# Patient Record
Sex: Female | Born: 1940 | Race: White | Hispanic: No | Marital: Married | Smoking: Former smoker
Health system: Southern US, Community
[De-identification: ages and names within clinical notes are randomized; demographics above are authoritative.]

## PROBLEM LIST (undated history)

## (undated) DIAGNOSIS — E119 Type 2 diabetes mellitus without complications: Secondary | ICD-10-CM

## (undated) DIAGNOSIS — I10 Essential (primary) hypertension: Secondary | ICD-10-CM

## (undated) DIAGNOSIS — H539 Unspecified visual disturbance: Secondary | ICD-10-CM

## (undated) DIAGNOSIS — Z974 Presence of external hearing-aid: Secondary | ICD-10-CM

## (undated) DIAGNOSIS — M79605 Pain in left leg: Secondary | ICD-10-CM

## (undated) DIAGNOSIS — K219 Gastro-esophageal reflux disease without esophagitis: Secondary | ICD-10-CM

## (undated) DIAGNOSIS — E785 Hyperlipidemia, unspecified: Secondary | ICD-10-CM

## (undated) DIAGNOSIS — Z87442 Personal history of urinary calculi: Secondary | ICD-10-CM

## (undated) DIAGNOSIS — M1990 Unspecified osteoarthritis, unspecified site: Secondary | ICD-10-CM

## (undated) HISTORY — DX: Unspecified visual disturbance: H53.9

## (undated) HISTORY — DX: Type 2 diabetes mellitus without complications: E11.9

## (undated) HISTORY — DX: Essential (primary) hypertension: I10

## (undated) HISTORY — DX: Unspecified osteoarthritis, unspecified site: M19.90

## (undated) HISTORY — PX: OTHER SURGICAL HISTORY: SHX169

---
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
## 1980-09-08 HISTORY — PX: ABDOMINAL HYSTERECTOMY: SHX81

## 2014-09-08 HISTORY — PX: JOINT REPLACEMENT: SHX530

## 2015-03-01 DIAGNOSIS — Z96653 Presence of artificial knee joint, bilateral: Secondary | ICD-10-CM | POA: Insufficient documentation

## 2015-06-07 DIAGNOSIS — Z96653 Presence of artificial knee joint, bilateral: Secondary | ICD-10-CM | POA: Insufficient documentation

## 2020-02-17 DIAGNOSIS — E119 Type 2 diabetes mellitus without complications: Secondary | ICD-10-CM | POA: Insufficient documentation

## 2020-03-22 DIAGNOSIS — M7062 Trochanteric bursitis, left hip: Secondary | ICD-10-CM | POA: Insufficient documentation

## 2020-04-05 DIAGNOSIS — M5126 Other intervertebral disc displacement, lumbar region: Secondary | ICD-10-CM | POA: Insufficient documentation

## 2020-04-06 ENCOUNTER — Other Ambulatory Visit: Payer: Self-pay | Admitting: Neurological Surgery

## 2020-04-06 DIAGNOSIS — M5126 Other intervertebral disc displacement, lumbar region: Secondary | ICD-10-CM

## 2020-04-10 ENCOUNTER — Ambulatory Visit
Admission: RE | Admit: 2020-04-10 | Discharge: 2020-04-10 | Disposition: A | Discharge status: Home / Self Care | Payer: Medicare Other | Source: Ambulatory Visit | Attending: Neurological Surgery | Admitting: Neurological Surgery

## 2020-04-10 ENCOUNTER — Other Ambulatory Visit: Payer: Self-pay

## 2020-04-10 DIAGNOSIS — M5126 Other intervertebral disc displacement, lumbar region: Secondary | ICD-10-CM

## 2020-04-10 IMAGING — XA Imaging study
2 series · 2 of 2 positions shown · non-contrast
Comparison: none

CLINICAL DATA: Spondylosis without myelopathy. Left radicular pain
to the ankle and foot. Left L4-5 epidural requested.

[Series 1: ortho standard · 1 of 1 slices shown (1 of 2)]
[im 1/1]
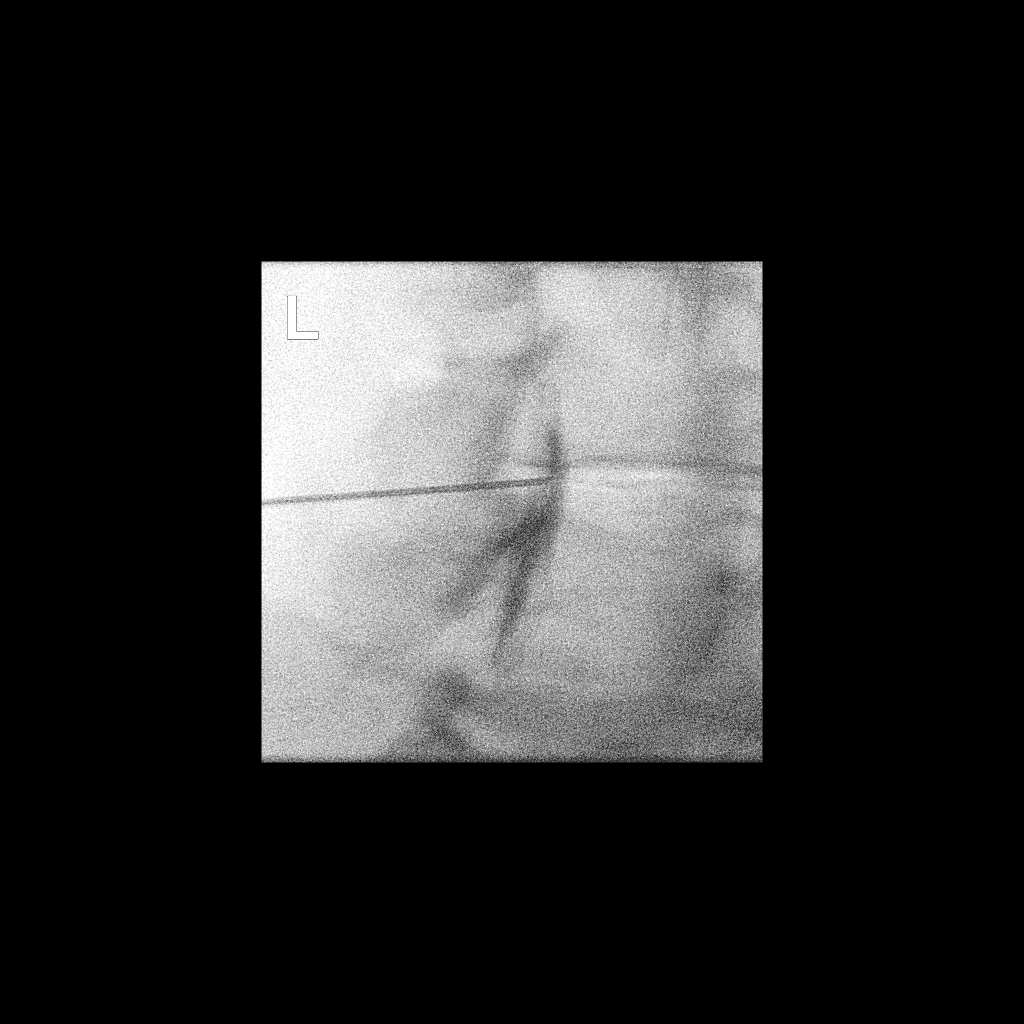

[Series 2: ortho standard · 1 of 1 slices shown (2 of 2)]
[im 1/1]
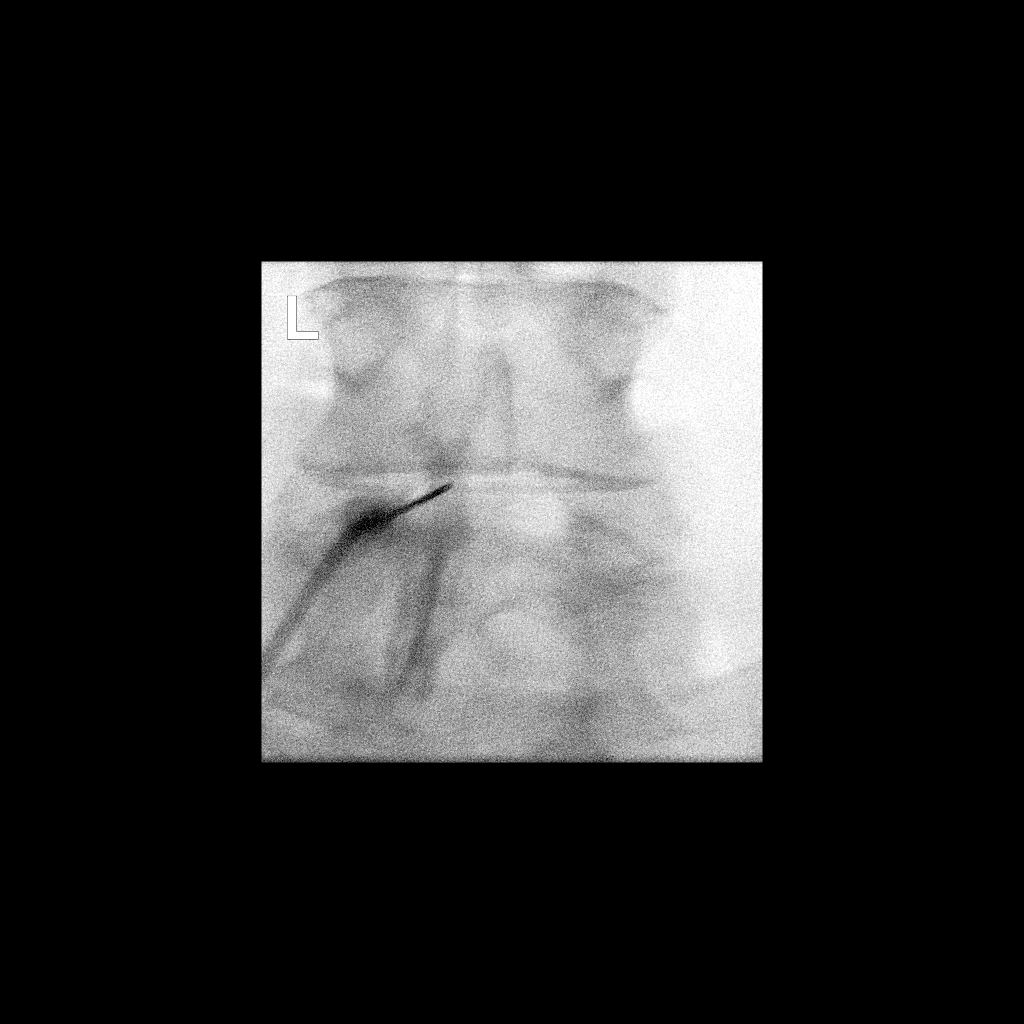

[2 of 2 positions shown; findings below may reference images not displayed]

FLUOROSCOPY TIME:  0 minutes 29 seconds. 16.27 micro gray meter
squared

PROCEDURE:
The procedure, risks, benefits, and alternatives were explained to
the patient. Questions regarding the procedure were encouraged and
answered. The patient understands and consents to the procedure.

LUMBAR EPIDURAL INJECTION:

An interlaminar approach was performed on the left at L4-5. The
overlying skin was cleansed and anesthetized. A 20 gauge epidural
needle was advanced using loss-of-resistance technique.

DIAGNOSTIC EPIDURAL INJECTION:

Injection of Isovue-M 200 shows a good epidural pattern with spread
above and below the level of needle placement, primarily on the
left. No vascular opacification is seen.

THERAPEUTIC EPIDURAL INJECTION:

One hundred twenty mg of Depo-Medrol mixed with 2.5 cc 1% lidocaine
were instilled. The procedure was well-tolerated, and the patient
was discharged thirty minutes following the injection in good
condition.

COMPLICATIONS:
None
IMPRESSION: Technically successful epidural injection on the left at L4-5.

## 2020-04-10 MED ORDER — IOPAMIDOL (ISOVUE-M 200) INJECTION 41%
1.0000 mL | Freq: Once | INTRAMUSCULAR | Status: AC
  Administered 2020-04-10: 1 mL via EPIDURAL

## 2020-04-10 MED ORDER — METHYLPREDNISOLONE ACETATE 40 MG/ML INJ SUSP (RADIOLOG
120.0000 mg | Freq: Once | INTRAMUSCULAR | Status: AC
  Administered 2020-04-10: 120 mg via EPIDURAL

## 2020-04-10 NOTE — Discharge Instructions (Signed)

## 2020-06-21 ENCOUNTER — Other Ambulatory Visit: Payer: Self-pay | Admitting: Neurological Surgery

## 2020-06-21 DIAGNOSIS — N281 Cyst of kidney, acquired: Secondary | ICD-10-CM

## 2020-06-22 ENCOUNTER — Encounter: Payer: Self-pay | Admitting: Adult Health

## 2020-06-22 ENCOUNTER — Ambulatory Visit (INDEPENDENT_AMBULATORY_CARE_PROVIDER_SITE_OTHER): Payer: Medicare Other | Admitting: Adult Health

## 2020-06-22 ENCOUNTER — Other Ambulatory Visit: Payer: Self-pay

## 2020-06-22 VITALS — BP 140/88 | HR 74 | Temp 97.9°F | Ht 60.0 in | Wt 174.6 lb

## 2020-06-22 DIAGNOSIS — M5442 Lumbago with sciatica, left side: Secondary | ICD-10-CM

## 2020-06-22 DIAGNOSIS — Z23 Encounter for immunization: Secondary | ICD-10-CM | POA: Diagnosis not present

## 2020-06-22 DIAGNOSIS — E118 Type 2 diabetes mellitus with unspecified complications: Secondary | ICD-10-CM | POA: Diagnosis not present

## 2020-06-22 DIAGNOSIS — G2581 Restless legs syndrome: Secondary | ICD-10-CM

## 2020-06-22 DIAGNOSIS — Z7689 Persons encountering health services in other specified circumstances: Secondary | ICD-10-CM | POA: Diagnosis not present

## 2020-06-22 DIAGNOSIS — I10 Essential (primary) hypertension: Secondary | ICD-10-CM | POA: Diagnosis not present

## 2020-06-22 DIAGNOSIS — G8929 Other chronic pain: Secondary | ICD-10-CM

## 2020-06-22 DIAGNOSIS — J302 Other seasonal allergic rhinitis: Secondary | ICD-10-CM

## 2020-06-22 MED ORDER — FLUTICASONE PROPIONATE 50 MCG/ACT NA SUSP: 2.0000 | Freq: Every day | NASAL | 6 refills | Status: DC

## 2020-06-22 MED ORDER — SITAGLIPTIN PHOSPHATE 25 MG PO TABS: 25.0000 mg | ORAL_TABLET | Freq: Every day | ORAL | 0 refills | Status: DC

## 2020-06-22 NOTE — Progress Notes (Signed)
Patient presents to clinic today to establish care. She is a pleasant 79 year old female who  has a past medical history of Arthritis, Diabetes mellitus ([place]), and Hypertension.   She recently moved to [facility] from [place], [place]  Her last CPE was in July 2021   Acute Concerns: Establish Care  Chronic Issues: Essential Hypertension - Takes Ziac 2.5-6.25 mg daily, and Dyazide 37.5-25 mg daily.  She denies dizziness, lightheadedness, chest pain, or shortness of breath  BP Readings from Last 3 Encounters:  06/22/20 140/88  04/10/20 (!) 163/57   Diabetes - Takes Januvia 25 mg, her last A1c was done in July 2021 and was 6.4   Restless Leg syndrome - takes Mirapex 1 mg QHS - feels as though this works well for her.   Sciatica -is currently working with Dr. 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  She has received multiple steroid injections without much relief.  They are talking about performing surgery to help relieve her pain.  She does have a follow-up appointment coming up.  Health Maintenance: Dental -- Routine Care Vision -- Routine Care Immunizations -- needs flu shot  Colonoscopy -- 2019- normal. No longer needed  Mammogram -- No longer needed PAP -- No longer needed Bone Density -- Unknown     Past Medical History:  Diagnosis Date   Arthritis    Diabetes mellitus ([place])    Hypertension     Past Surgical History:  Procedure Laterality Date   ABDOMINAL HYSTERECTOMY  1982   APPENDECTOMY  1957   left knee surgery       Current Outpatient Medications on File Prior to Visit  Medication Sig Dispense Refill   bisoprolol-hydrochlorothiazide (ZIAC) 2.5-6.25 MG tablet Take 1 tablet by mouth daily.     glucosamine-chondroitin 500-400 MG tablet Take 1 tablet by mouth 3 (three) times daily.     Multiple Vitamin (MULTIVITAMIN) tablet Take 1 tablet by mouth daily.     pramipexole (MIRAPEX) 1 MG tablet Take 1 mg by mouth 3 (three) times daily.      triamterene-hydrochlorothiazide (DYAZIDE) 37.5-25 MG capsule Take 1 capsule by mouth daily.     No current facility-administered medications on file prior to visit.    Allergies  Allergen Reactions   Diclofenac Swelling    1Suspected to have caused hives 2015-- had allergy testing 1Suspected to have caused hives 2015-- had allergy testing                          Tongue swelling   Metformin     Family History  Family history unknown: Yes    Social History   Socioeconomic History   Marital status: Married    Spouse name: Not on file   Number of children: Not on file   Years of education: Not on file   Highest education level: Not on file  Occupational History   Not on file  Tobacco Use   Smoking status: Never Smoker   Smokeless tobacco: Never Used  Substance and Sexual Activity   Alcohol use: Never   Drug use: Never   Sexual activity: Not on file  Other Topics Concern   Not on file  Social History Narrative   Not on file   Social Determinants of Health   Financial Resource Strain:    Difficulty of Paying Living Expenses: Not on file  Food Insecurity:    Worried About Running Out of  Food in the Last Year: Not on file   Ran Out of Food in the Last Year: Not on file  Transportation Needs:    Lack of Transportation (Medical): Not on file   Lack of Transportation (Non-Medical): Not on file  Physical Activity:    Days of Exercise per Week: Not on file   Minutes of Exercise per Session: Not on file  Stress:    Feeling of Stress : Not on file  Social Connections:    Frequency of Communication with Friends and Family: Not on file   Frequency of Social Gatherings with Friends and Family: Not on file   Attends Religious Services: Not on file   Active Member of Clubs or Organizations: Not on file   Attends Archivist Meetings: Not on file   Marital Status: Not on file  Intimate Partner Violence:    Fear  of Current or Ex-Partner: Not on file   Emotionally Abused: Not on file   Physically Abused: Not on file   Sexually Abused: Not on file    Review of Systems  Constitutional: Negative.   HENT: Positive for hearing loss.   Eyes: Negative.   Respiratory: Negative.   Cardiovascular: Negative.   Gastrointestinal: Negative.   Genitourinary: Negative.   Musculoskeletal: Positive for back pain and joint pain.  Skin: Negative.   Neurological: Negative.   Endo/Heme/Allergies: Negative.   Psychiatric/Behavioral: Negative.   All other systems reviewed and are negative.   BP 140/88 (BP Location: Left Arm, Patient Position: Sitting, Cuff Size: Normal)    Pulse 74    Temp 97.9 F (36.6 C)    Ht 5' (1.524 m)    Wt 174 lb 9.6 oz (79.2 kg)    SpO2 96%    BMI 34.10 kg/m   Physical Exam Vitals and nursing note reviewed.  Constitutional:      General: She is not in acute distress.    Appearance: Normal appearance. She is well-developed. She is obese. She is not ill-appearing.  HENT:     Head: Normocephalic and atraumatic.     Right Ear: Tympanic membrane, ear canal and external ear normal. There is no impacted cerumen.     Left Ear: Tympanic membrane, ear canal and external ear normal. There is no impacted cerumen.     Nose: Nose normal. No congestion or rhinorrhea.     Mouth/Throat:     Mouth: Mucous membranes are moist.     Pharynx: Oropharynx is clear. No oropharyngeal exudate or posterior oropharyngeal erythema.  Eyes:     General:        Right eye: No discharge.        Left eye: No discharge.     Extraocular Movements: Extraocular movements intact.     Conjunctiva/sclera: Conjunctivae normal.     Pupils: Pupils are equal, round, and reactive to light.  Neck:     Thyroid: No thyromegaly.     Vascular: No carotid bruit.     Trachea: No tracheal deviation.  Cardiovascular:     Rate and Rhythm: Normal rate and regular rhythm.     Pulses: Normal pulses.     Heart sounds: Normal  heart sounds. No murmur heard.  No friction rub. No gallop.   Pulmonary:     Effort: Pulmonary effort is normal. No respiratory distress.     Breath sounds: Normal breath sounds. No stridor. No wheezing, rhonchi or rales.  Chest:     Chest wall: No tenderness.  Abdominal:  General: Abdomen is flat. Bowel sounds are normal. There is no distension.     Palpations: Abdomen is soft. There is no mass.     Tenderness: There is no abdominal tenderness. There is no right CVA tenderness, left CVA tenderness, guarding or rebound.     Hernia: No hernia is present.  Musculoskeletal:        General: Tenderness (left side sciatica) present. No swelling, deformity or signs of injury. Normal range of motion.     Cervical back: Normal range of motion and neck supple.     Right lower leg: No edema.     Left lower leg: No edema.  Lymphadenopathy:     Cervical: No cervical adenopathy.  Skin:    General: Skin is warm and dry.     Coloration: Skin is not jaundiced or pale.     Findings: No bruising, erythema, lesion or rash.  Neurological:     General: No focal deficit present.     Mental Status: She is alert and oriented to person, place, and time.     Cranial Nerves: No cranial nerve deficit.     Sensory: No sensory deficit.     Motor: No weakness.     Coordination: Coordination normal.     Gait: Gait normal.     Deep Tendon Reflexes: Reflexes normal.  Psychiatric:        Mood and Affect: Mood normal.        Behavior: Behavior normal.        Thought Content: Thought content normal.        Judgment: Judgment normal.     Assessment/Plan: 1. Encounter to establish care - Follow up in July 2022 for CPE or sooner if needed  2. Controlled type 2 diabetes mellitus with complication, without long-term current use of insulin ([place]) - Consider adding agent  - Hemoglobin A1c; Future - BMP with eGFR([facility]); Future - BMP with eGFR([facility]) - Hemoglobin A1c - sitaGLIPtin (JANUVIA) 25 MG tablet; Take  1 tablet (25 mg total) by mouth daily.  Dispense: 90 tablet; Refill: 0  3. Need for influenza vaccination  - Flu Vaccine QUAD High Dose(Fluad)  4. Primary hypertension - Continue with current medications  - BMP with eGFR([facility]) - Hemoglobin A1c  5. Restless leg syndrome - Continue with Mirapex  6. Seasonal allergies  - fluticasone (FLONASE) 50 MCG/ACT nasal spray; Place 2 sprays into both nostrils daily.  Dispense: 16 g; Refill: 6  7. Chronic left-sided low back pain with left-sided sciatica - Follow up with Neurosurgery as directed  Dorothyann Peng, NP

## 2020-06-22 NOTE — Patient Instructions (Signed)
It was great meeting you today   I will follow up with you regarding your blood work   I will send in your medications   You can use flonase for the ear fullness

## 2020-06-22 NOTE — Addendum Note (Signed)
Addended by: CAIRRIKIER DAVIDSON, Len Azeez M on: 06/22/2020 03:23 PM   Modules accepted: Orders  

## 2020-06-23 LAB — BASIC METABOLIC PANEL WITH GFR
BUN: 19 mg/dL (ref 7–25)
CO2: 30 mmol/L (ref 20–32)
Calcium: 11.5 mg/dL — ABNORMAL HIGH (ref 8.6–10.4)
Chloride: 100 mmol/L (ref 98–110)
Creat: 0.81 mg/dL (ref 0.60–0.88)
GFR, Est African American: 79 mL/min/{1.73_m2} (ref >=60)
GFR, Est Non African American: 69 mL/min/{1.73_m2} (ref >=60)
Glucose, Bld: 156 mg/dL — ABNORMAL HIGH (ref 65–99)
Potassium: 5 mmol/L (ref 3.5–5.3)
Sodium: 140 mmol/L (ref 135–146)

## 2020-06-23 LAB — HEMOGLOBIN A1C
Hgb A1c MFr Bld: 6.7 % of total Hgb — ABNORMAL HIGH (ref <5.7)
Mean Plasma Glucose: 146 (calc)
eAG (mmol/L): 8.1 (calc)

## 2020-06-23 LAB — SPECIMEN COMPROMISED

## 2020-06-26 ENCOUNTER — Other Ambulatory Visit: Payer: Self-pay | Admitting: Adult Health

## 2020-06-28 ENCOUNTER — Telehealth: Payer: Self-pay | Admitting: *Deleted

## 2020-06-28 NOTE — Telephone Encounter (Signed)
Patient called to get her lab results. Please advise

## 2020-06-28 NOTE — Telephone Encounter (Signed)
See results note. 

## 2020-07-03 ENCOUNTER — Other Ambulatory Visit: Payer: Self-pay

## 2020-07-03 ENCOUNTER — Other Ambulatory Visit: Payer: Medicare Other

## 2020-07-04 LAB — VITAMIN D 25 HYDROXY (VIT D DEFICIENCY, FRACTURES): Vit D, 25-Hydroxy: 34 ng/mL (ref 30–100)

## 2020-07-04 LAB — PTH, INTACT AND CALCIUM
Calcium: 10.5 mg/dL — ABNORMAL HIGH (ref 8.6–10.4)
PTH: 59 pg/mL (ref 14–64)

## 2020-07-06 ENCOUNTER — Ambulatory Visit
Admission: RE | Admit: 2020-07-06 | Discharge: 2020-07-06 | Disposition: A | Discharge status: Home / Self Care | Payer: Medicare Other | Source: Ambulatory Visit | Attending: Neurological Surgery | Admitting: Neurological Surgery

## 2020-07-06 DIAGNOSIS — N281 Cyst of kidney, acquired: Secondary | ICD-10-CM

## 2020-07-06 IMAGING — CT CT ABD-PELV W/O CM
2 of 4 series · 11 of 46 positions shown, 12 images · non-contrast
Comparison: None.

CLINICAL DATA: 79-year-old female with renal cyst. Patient is
asymptomatic.

EXAM:
CT ABDOMEN AND PELVIS WITHOUT CONTRAST
TECHNIQUE: Multidetector CT imaging of the abdomen and pelvis was performed
following the standard protocol without IV contrast.

[Series 2: routine abdomen pelvis without 5.00 br40 s3 axial · axial · non-contrast · 0.61mm/px · z∈[+1196,+1541]mm · 8 of 83 slices shown, 9 images]
[im 7/83  soft-tissue]
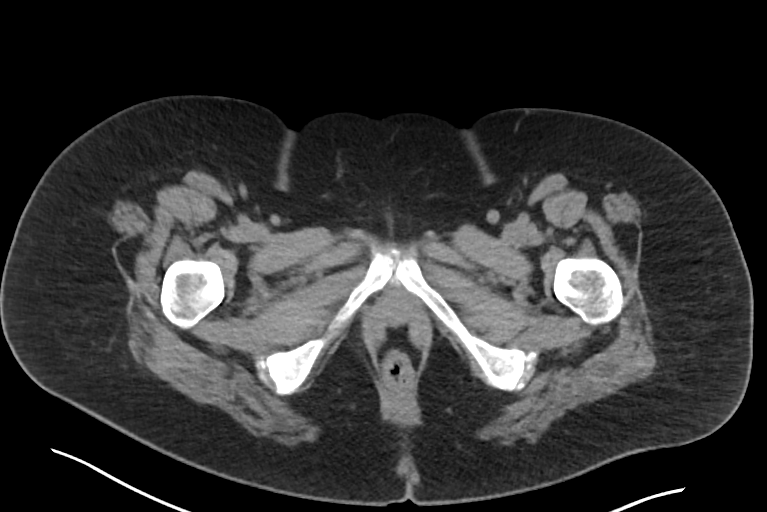
[im 7/83  bone]
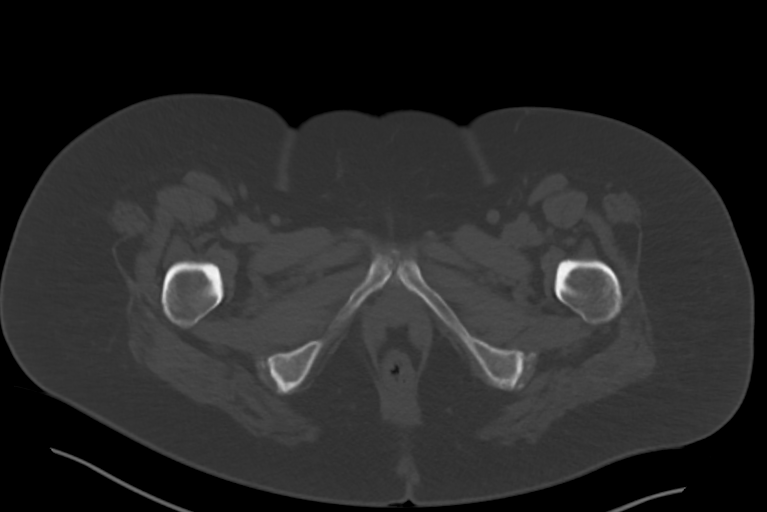
[im 17/83  soft-tissue]
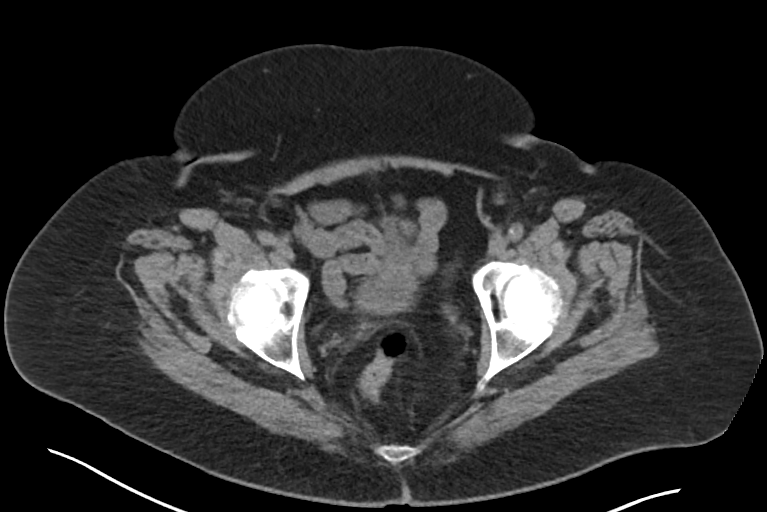
[im 27/83  soft-tissue]
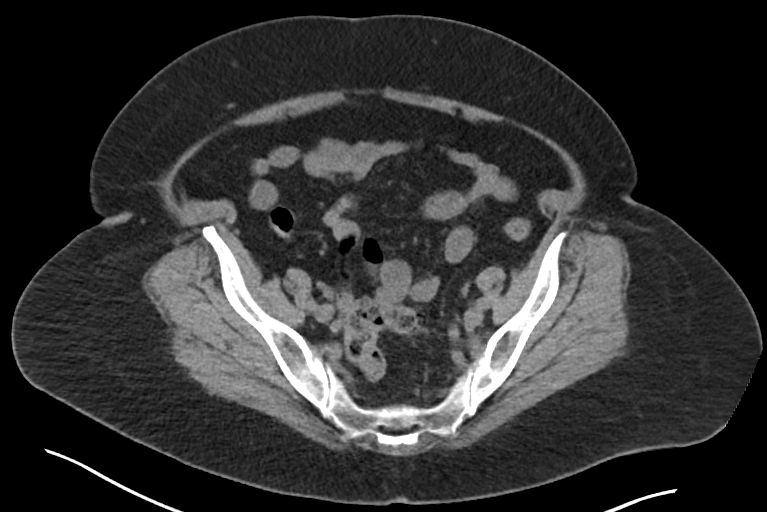
[im 37/83  soft-tissue]
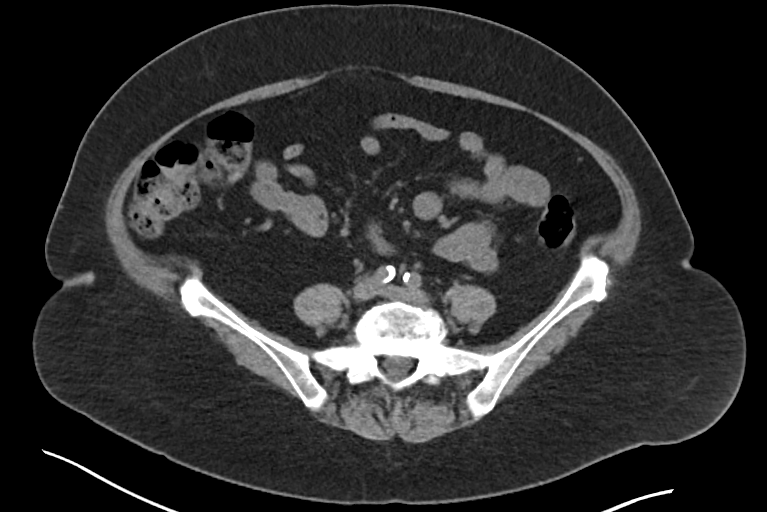
[im 46/83  soft-tissue]
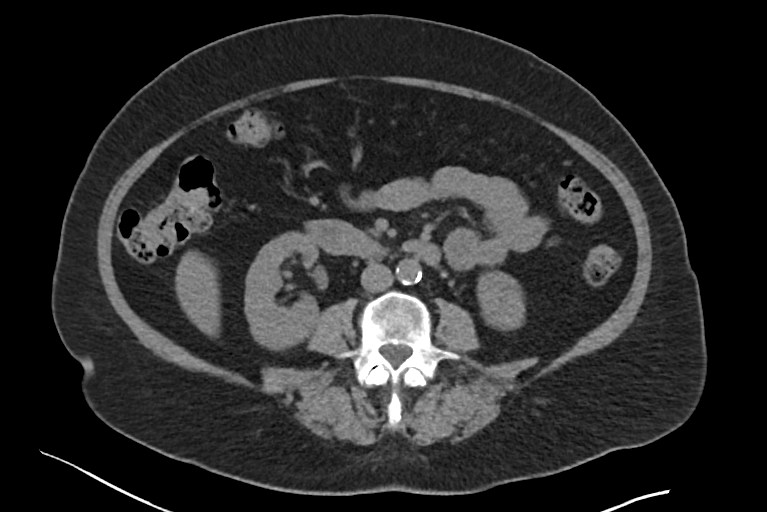
[im 56/83  soft-tissue]
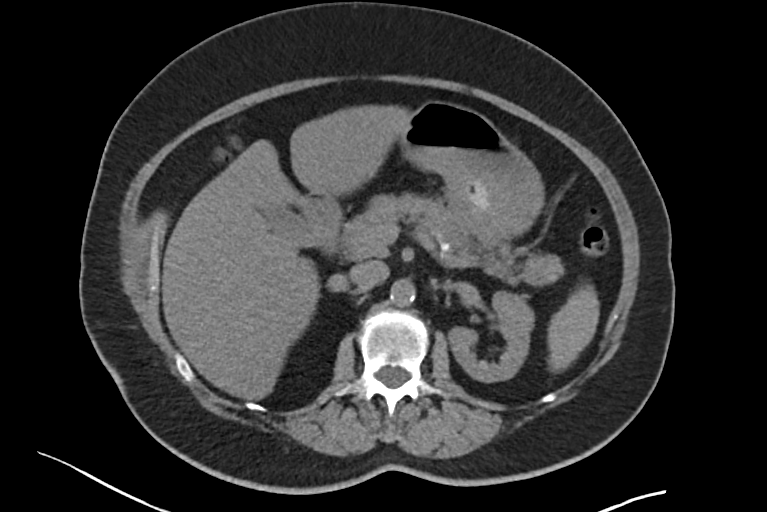
[im 66/83  soft-tissue]
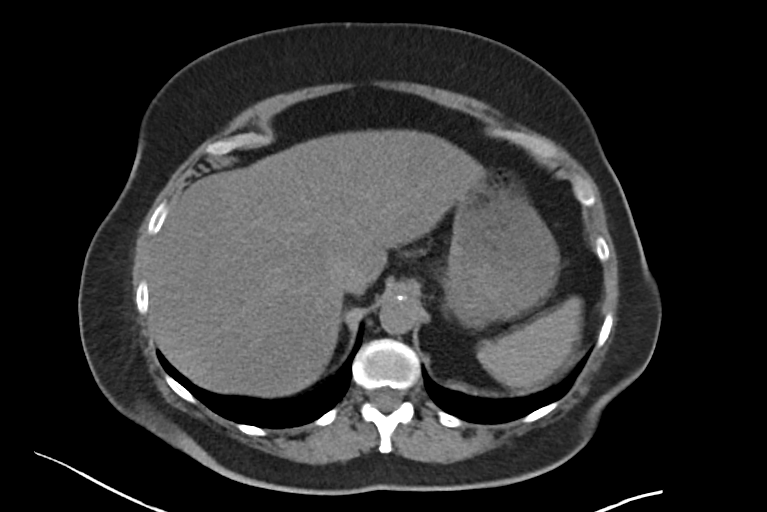
[im 76/83  soft-tissue]
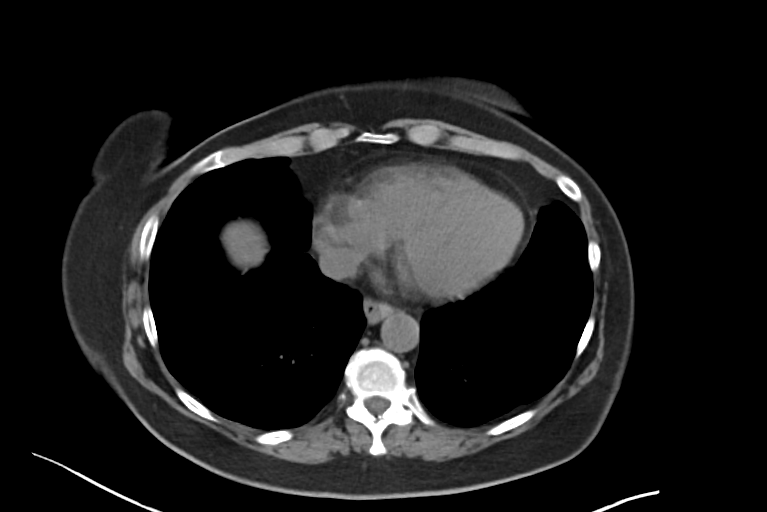

[Series 4: routine abdomen pelvis without 2.00 br40 s3 cor · coronal · non-contrast · 0.82mm/px · 3 of 156 slices shown]
[im 52/156  soft-tissue]
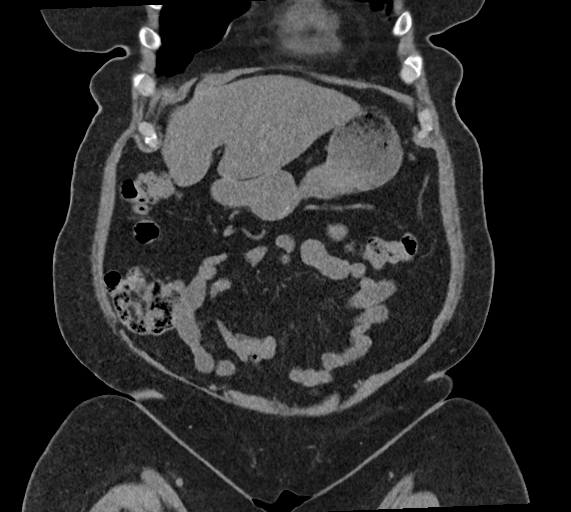
[im 69/156  soft-tissue]
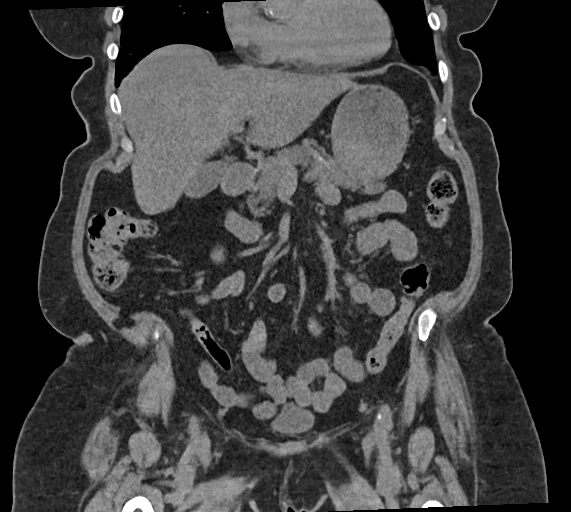
[im 87/156  soft-tissue]
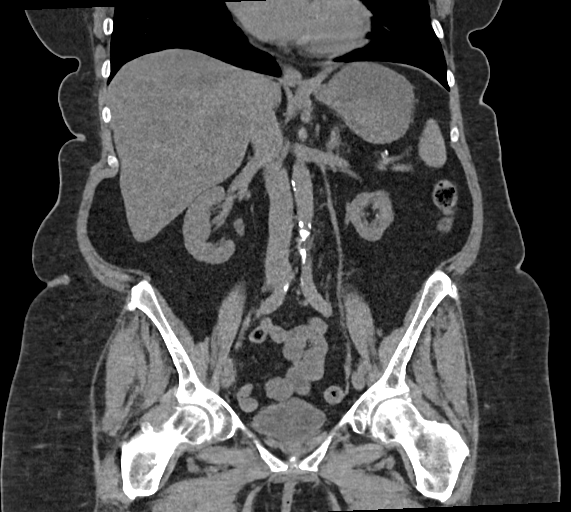

[11 of 46 positions shown; findings below may reference images not displayed]

FINDINGS: Evaluation of this exam is limited in the absence of intravenous
contrast.

Lower chest: The visualized lung bases are clear.

No intra-abdominal free air or free fluid.

Hepatobiliary: There is diffuse fatty infiltration of the liver. No
intrahepatic biliary ductal dilatation. The gallbladder is
unremarkable.

Pancreas: Unremarkable. No pancreatic ductal dilatation or
surrounding inflammatory changes.

Spleen: Normal in size without focal abnormality.

Adrenals/Urinary Tract: The adrenal glands unremarkable. There is a
cluster of small stones in the upper pole of the right kidney with
combined dimension of 7 x 8 mm. There appears to be a 12 x 20 mm
cystic structure associated with this stone. Evaluation of the
cystic structure is very limited on this noncontrast CT. This may
represent a stone within a dilated calyx. Further evaluation with
renal ultrasound recommended. Multiple additional smaller
nonobstructing bilateral renal calculi measure 2-3 mm. There is no
hydronephrosis on either side. There is a faint 1 cm hypodense
lesion in the inferior pole the right kidney which is not
characterized. The visualized ureters appear unremarkable. The
urinary bladder is mildly distended and grossly unremarkable. There
is a 4 mm stone along the bladder base to the left of the midline,
likely external to the bladder.

Stomach/Bowel: There is moderate stool throughout the colon. There
are several sigmoid diverticula. There is minimal haziness of the
perisigmoid fat (coronal 101/4). This may represent chronic
inflammation or mild acute diverticulitis. Clinical correlation
recommended. No diverticular abscess or perforation. There is no
bowel obstruction. Appendectomy.

Vascular/Lymphatic: Moderate aortoiliac atherosclerotic disease. The
IVC is unremarkable. No portal venous gas. There is no adenopathy.

Reproductive: Hysterectomy. No adnexal masses.

Other: Small fat containing umbilical hernia.

Musculoskeletal: Degenerative changes of the lower lumbar spine. No
acute osseous pathology.
IMPRESSION: 1. Cluster of small stones in the upper pole of the right kidney
likely flu seated with a cyst or dilated calyx. Additional smaller
hypodense lesion in the inferior pole of the right kidney noted.
These are not characterized on this noncontrast CT. Further
evaluation with renal ultrasound recommended.
2. Additional smaller nonobstructing bilateral renal calculi. No
hydronephrosis.
3. Fatty liver.
4. Sigmoid diverticulosis with chronic changes or possible mild
acute diverticulitis. Clinical correlation recommended. No
diverticular abscess or perforation.
5. Aortic Atherosclerosis ([34]-[34]).

## 2020-07-12 ENCOUNTER — Other Ambulatory Visit: Payer: Self-pay | Admitting: Adult Health

## 2020-07-12 HISTORY — PX: BACK SURGERY: SHX140

## 2020-07-12 MED ORDER — BISOPROLOL-HYDROCHLOROTHIAZIDE 2.5-6.25 MG PO TABS: 1.0000 | ORAL_TABLET | Freq: Every day | ORAL | 3 refills | Status: DC

## 2020-07-12 NOTE — Telephone Encounter (Signed)
Pharmacy is calling to get a new Rx for bisoprolol-hydrochlorolthiazide [facility]) 2.5-6.25 MG stated that they could not transfer it from [place] to them.  Pharm:  [facility].

## 2020-07-30 ENCOUNTER — Telehealth: Payer: Self-pay | Admitting: Adult Health

## 2020-07-30 NOTE — Telephone Encounter (Signed)
Left message for patient to schedule Annual Wellness Visit.  Please schedule with Nurse Health Advisor Shannon Crews, RN at [facility]  

## 2020-08-23 DIAGNOSIS — M5416 Radiculopathy, lumbar region: Secondary | ICD-10-CM | POA: Insufficient documentation

## 2020-09-21 ENCOUNTER — Other Ambulatory Visit: Payer: Self-pay

## 2020-09-21 ENCOUNTER — Ambulatory Visit (INDEPENDENT_AMBULATORY_CARE_PROVIDER_SITE_OTHER): Payer: Medicare Other | Admitting: Adult Health

## 2020-09-21 ENCOUNTER — Encounter: Payer: Self-pay | Admitting: Adult Health

## 2020-09-21 VITALS — BP 118/76 | Temp 98.7°F | Ht 60.0 in | Wt 172.0 lb

## 2020-09-21 DIAGNOSIS — E118 Type 2 diabetes mellitus with unspecified complications: Secondary | ICD-10-CM

## 2020-09-21 DIAGNOSIS — M5432 Sciatica, left side: Secondary | ICD-10-CM

## 2020-09-21 DIAGNOSIS — I10 Essential (primary) hypertension: Secondary | ICD-10-CM | POA: Diagnosis not present

## 2020-09-21 DIAGNOSIS — G2581 Restless legs syndrome: Secondary | ICD-10-CM

## 2020-09-21 DIAGNOSIS — R2242 Localized swelling, mass and lump, left lower limb: Secondary | ICD-10-CM | POA: Diagnosis not present

## 2020-09-21 LAB — CBC WITH DIFFERENTIAL/PLATELET
Basophils Absolute: 0 10*3/uL (ref 0.0–0.1)
Basophils Relative: 0.3 % (ref 0.0–3.0)
Eosinophils Absolute: 0.2 10*3/uL (ref 0.0–0.7)
Eosinophils Relative: 2.5 % (ref 0.0–5.0)
HCT: 41.8 % (ref 36.0–46.0)
Hemoglobin: 14.2 g/dL (ref 12.0–15.0)
Lymphocytes Relative: 16 % (ref 12.0–46.0)
Lymphs Abs: 1.4 10*3/uL (ref 0.7–4.0)
MCHC: 34.1 g/dL (ref 30.0–36.0)
MCV: 97.2 fl (ref 78.0–100.0)
Monocytes Absolute: 0.7 10*3/uL (ref 0.1–1.0)
Monocytes Relative: 7.7 % (ref 3.0–12.0)
Neutro Abs: 6.6 10*3/uL (ref 1.4–7.7)
Neutrophils Relative %: 73.5 % (ref 43.0–77.0)
Platelets: 267 10*3/uL (ref 150.0–400.0)
RBC: 4.29 Mil/uL (ref 3.87–5.11)
RDW: 13.8 % (ref 11.5–15.5)
WBC: 9 10*3/uL (ref 4.0–10.5)

## 2020-09-21 LAB — COMPREHENSIVE METABOLIC PANEL
ALT: 18 U/L (ref 0–35)
AST: 13 U/L (ref 0–37)
Albumin: 4.3 g/dL (ref 3.5–5.2)
Alkaline Phosphatase: 100 U/L (ref 39–117)
BUN: 20 mg/dL (ref 6–23)
CO2: 28 mEq/L (ref 19–32)
Calcium: 11 mg/dL — ABNORMAL HIGH (ref 8.4–10.5)
Chloride: 99 mEq/L (ref 96–112)
Creatinine, Ser: 0.68 mg/dL (ref 0.40–1.20)
GFR: 82.28 mL/min (ref >60.00)
Glucose, Bld: 111 mg/dL — ABNORMAL HIGH (ref 70–99)
Potassium: 4 mEq/L (ref 3.5–5.1)
Sodium: 137 mEq/L (ref 135–145)
Total Bilirubin: 0.6 mg/dL (ref 0.2–1.2)
Total Protein: 7 g/dL (ref 6.0–8.3)

## 2020-09-21 LAB — HEMOGLOBIN A1C: Hgb A1c MFr Bld: 7.5 % — ABNORMAL HIGH (ref 4.6–6.5)

## 2020-09-21 LAB — LIPID PANEL
Cholesterol: 181 mg/dL (ref 0–200)
HDL: 65.7 mg/dL (ref >39.00)
NonHDL: 115.19
Total CHOL/HDL Ratio: 3
Triglycerides: 208 mg/dL — ABNORMAL HIGH (ref 0.0–149.0)
VLDL: 41.6 mg/dL — ABNORMAL HIGH (ref 0.0–40.0)

## 2020-09-21 LAB — LDL CHOLESTEROL, DIRECT: Direct LDL: 90 mg/dL

## 2020-09-21 LAB — TSH: TSH: 1.34 u[IU]/mL (ref 0.35–4.50)

## 2020-09-21 MED ORDER — SITAGLIPTIN PHOSPHATE 25 MG PO TABS
25.0000 mg | ORAL_TABLET | Freq: Every day | ORAL | 0 refills | Status: DC
Start: 2020-09-21 — End: 2020-12-20

## 2020-09-21 MED ORDER — TRAMADOL HCL 50 MG PO TABS: 50.0000 mg | ORAL_TABLET | Freq: Three times a day (TID) | ORAL | 0 refills | Status: AC | PRN

## 2020-09-21 NOTE — Addendum Note (Signed)
Addended by: Evert Kohl D on: 09/21/2020 01:38 PM   Modules accepted: Orders

## 2020-09-21 NOTE — Progress Notes (Signed)
Subjective:    Patient ID: Ann Thompson, female    DOB: October 21, 1940, 80 y.o.   MRN: [id number]  HPI Patient presents for yearly preventative medicine examination. She is a pleasant 80 year old female who  has a past medical history of Arthritis, Diabetes mellitus ([facility]), and Hypertension.  Essential Hypertension -takes Ziac 2.5-6.25 mg daily and Dyazide 37.5-25 mg daily.  She denies dizziness, lightheadedness, chest pain, shortness of breath  BP Readings from Last 3 Encounters:  09/21/20 118/76  06/22/20 140/88  04/10/20 (!) 163/57   Diabetes-prescribed Januvia 25 mg daily.  Lab Results  Component Value Date   HGBA1C 6.7 (H) 06/22/2020   Restless Leg Syndrome - takes Mirapex 1 mg QHS - she feels as though this works well for her.   Sciatica -seen by Dr. 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  In the past she has received multiple steroid injections without much relief. Recently had what sounds like a nerve ablation done a week ago but has not had any relief yet.   Painful mass - reports that over the last few months she has noticed a painful non movable mass on her left upper thigh. Does not feel like it has grown in size over the last few months. Denies bruising   All immunizations and health maintenance protocols were reviewed with the patient and needed orders were placed.  Appropriate screening laboratory values were ordered for the patient including screening of hyperlipidemia, renal function and hepatic function.  Medication reconciliation,  past medical history, social history, problem list and allergies were reviewed in detail with the patient  Goals were established with regard to weight loss, exercise, and  diet in compliance with medications   Review of Systems  Constitutional: Negative.   HENT: Negative.   Eyes: Negative.   Respiratory: Negative.   Cardiovascular: Negative.   Gastrointestinal: Negative.   Endocrine: Negative.   Genitourinary: Negative.    Musculoskeletal: Negative.   Skin: Negative.   Allergic/Immunologic: Negative.   Neurological: Negative.   Hematological: Negative.   Psychiatric/Behavioral: Negative.    Past Medical History:  Diagnosis Date  . Arthritis   . Diabetes mellitus (H[facility])   . Hypertension     Social History   Socioeconomic History  . Marital status: Married    Spouse name: Not on file  . Number of children: Not on file  . Years of education: Not on file  . Highest education level: Not on file  Occupational History  . Not on file  Tobacco Use  . Smoking status: Never Smoker  . Smokeless tobacco: Never Used  Substance and Sexual Activity  . Alcohol use: Never  . Drug use: Never  . Sexual activity: Not on file  Other Topics Concern  . Not on file  Social History Narrative  . Not on file   Social Determinants of Health   Financial Resource Strain: Not on file  Food Insecurity: Not on file  Transportation Needs: Not on file  Physical Activity: Not on file  Stress: Not on file  Social Connections: Not on file  Intimate Partner Violence: Not on file    Past Surgical History:  Procedure Laterality Date  . ABDOMINAL HYSTERECTOMY  1982  . APPENDECTOMY  1957  . left knee surgery       Family History  Family history unknown: Yes    Allergies  Allergen Reactions  . Diclofenac Swelling    1Suspected to have caused hives 2015-- had allergy testing  1Suspected to have caused hives 2015-- had allergy testing                          Tongue swelling  . Metformin     Current Outpatient Medications on File Prior to Visit  Medication Sig Dispense Refill  . bisoprolol-hydrochlorothiazide (ZIAC) 2.5-6.25 MG tablet Take 1 tablet by mouth daily. 90 tablet 3  . fluticasone (FLONASE) 50 MCG/ACT nasal spray Place 2 sprays into both nostrils daily. 16 g 6  . glucosamine-chondroitin 500-400 MG tablet Take 1 tablet by mouth 3 (three) times daily.    . Multiple Vitamin  (MULTIVITAMIN) tablet Take 1 tablet by mouth daily.    . pramipexole (MIRAPEX) 1 MG tablet Take 1 mg by mouth 3 (three) times daily.    . sitaGLIPtin (JANUVIA) 25 MG tablet Take 1 tablet (25 mg total) by mouth daily. 90 tablet 0  . triamterene-hydrochlorothiazide (DYAZIDE) 37.5-25 MG capsule Take 1 capsule by mouth daily.     No current facility-administered medications on file prior to visit.    BP 118/76   Temp 98.7 F (37.1 C)   Ht 5' (1.524 m) Comment: WITH SHOES  Wt 172 lb (78 kg)   BMI 33.59 kg/m       Objective:   Physical Exam Vitals and nursing note reviewed.  Constitutional:      General: She is not in acute distress.    Appearance: Normal appearance. She is well-developed. She is not ill-appearing.  HENT:     Head: Normocephalic and atraumatic.     Right Ear: Tympanic membrane, ear canal and external ear normal. There is no impacted cerumen.     Left Ear: Tympanic membrane, ear canal and external ear normal. There is no impacted cerumen.     Nose: Nose normal. No congestion or rhinorrhea.     Mouth/Throat:     Mouth: Mucous membranes are moist.     Pharynx: Oropharynx is clear. No oropharyngeal exudate or posterior oropharyngeal erythema.  Eyes:     General:        Right eye: No discharge.        Left eye: No discharge.     Extraocular Movements: Extraocular movements intact.     Conjunctiva/sclera: Conjunctivae normal.     Pupils: Pupils are equal, round, and reactive to light.  Neck:     Thyroid: No thyromegaly.     Vascular: No carotid bruit.     Trachea: No tracheal deviation.  Cardiovascular:     Rate and Rhythm: Normal rate and regular rhythm.     Pulses: Normal pulses.     Heart sounds: Normal heart sounds. No murmur heard. No friction rub. No gallop.   Pulmonary:     Effort: Pulmonary effort is normal. No respiratory distress.     Breath sounds: Normal breath sounds. No stridor. No wheezing, rhonchi or rales.  Chest:     Chest wall: No  tenderness.  Abdominal:     General: Abdomen is flat. Bowel sounds are normal. There is no distension.     Palpations: Abdomen is soft. There is no mass.     Tenderness: There is no abdominal tenderness. There is no right CVA tenderness, left CVA tenderness, guarding or rebound.     Hernia: No hernia is present.  Musculoskeletal:        General: No swelling, tenderness, deformity or signs of injury. Normal range of motion.     Cervical back:  Normal range of motion and neck supple.     Right lower leg: No edema.     Left lower leg: No edema.     Comments: She has approx 3 inch x 1 inch mass that is paiful to touch on her left upper thigh   Lymphadenopathy:     Cervical: No cervical adenopathy.  Skin:    General: Skin is warm and dry.     Coloration: Skin is not jaundiced or pale.     Findings: No bruising, erythema, lesion or rash.  Neurological:     General: No focal deficit present.     Mental Status: She is alert and oriented to person, place, and time.     Cranial Nerves: No cranial nerve deficit.     Sensory: No sensory deficit.     Motor: No weakness.     Coordination: Coordination normal.     Gait: Gait normal.     Deep Tendon Reflexes: Reflexes normal.  Psychiatric:        Mood and Affect: Mood normal.        Behavior: Behavior normal.        Thought Content: Thought content normal.        Judgment: Judgment normal.        Assessment & Plan:  1. Controlled type 2 diabetes mellitus with complication, without long-term current use of insulin ([facility]) - Consider adding agent  - CBC with Differential/Platelet; Future - Comprehensive metabolic panel; Future - Hemoglobin A1c; Future - Lipid panel; Future - TSH; Future - sitaGLIPtin (JANUVIA) 25 MG tablet; Take 1 tablet (25 mg total) by mouth daily.  Dispense: 90 tablet; Refill: 0  2. Primary hypertension - well controlled. No change in medications  - CBC with Differential/Platelet; Future - Comprehensive metabolic  panel; Future - Hemoglobin A1c; Future - Lipid panel; Future - TSH; Future  3. Restless leg syndrome - Continue with mirapex   4. Mass of leg, left  - [place] LT LOWER EXTREM LTD SOFT TISSUE NON VASCULAR; Future  5. Sciatica of left side - Will give a short course of tramadol until she can be seen by Dr. Yetta Barre. Advised that this medication can cause drowsiness - traMADol (ULTRAM) 50 MG tablet; Take 1 tablet (50 mg total) by mouth every 8 (eight) hours as needed for up to 5 days.  Dispense: 15 tablet; Refill: 0  [name], NP

## 2020-09-25 ENCOUNTER — Other Ambulatory Visit: Payer: Self-pay | Admitting: Family Medicine

## 2020-09-25 ENCOUNTER — Telehealth: Payer: Self-pay | Admitting: Family Medicine

## 2020-09-25 MED ORDER — SIMVASTATIN 10 MG PO TABS: 10.0000 mg | ORAL_TABLET | Freq: Every day | ORAL | 3 refills | Status: DC

## 2020-09-25 NOTE — Telephone Encounter (Signed)
I spoke to the pt and Ann Thompson (her daughter).  Ann Thompson stated the Tramadol prescribed by [name] is making her sleepy during the day and is not taking care of the pain.  She is still hurting.  Called and spoke to [place].  He advised that she continue with the tramadol but in between doses she can take Motrin 400 mg every 6-8 hours PRN.  She may also use a heating pad. She also need to follow up with the surgeons office.  I have advised the patient of this along with [facility].  Advised a call back if needed.

## 2020-09-27 ENCOUNTER — Ambulatory Visit
Admission: RE | Admit: 2020-09-27 | Discharge: 2020-09-27 | Disposition: A | Discharge status: Home / Self Care | Payer: Medicare Other | Source: Ambulatory Visit | Attending: Adult Health | Admitting: Adult Health

## 2020-09-27 DIAGNOSIS — R2242 Localized swelling, mass and lump, left lower limb: Secondary | ICD-10-CM

## 2020-09-27 IMAGING — US US EXTREM LOW*L* LIMITED
1 series · 12 of 12 positions shown · non-contrast
Comparison: None.

CLINICAL DATA: Palpable mass left thigh

EXAM:
ULTRASOUND LEFT THIGH
TECHNIQUE: Longitudinal and transverse images of the proximal left thigh in the
area of palpable fullness obtained.

[Series 1: us extrem low*left* limited · 0.06mm/px · 12 acquisitions, 12 frames shown]
[im 1/12]
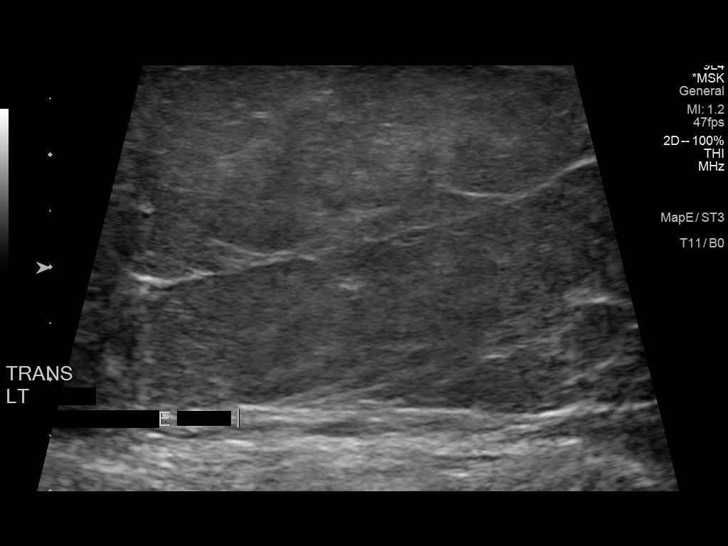
[im 2/12]
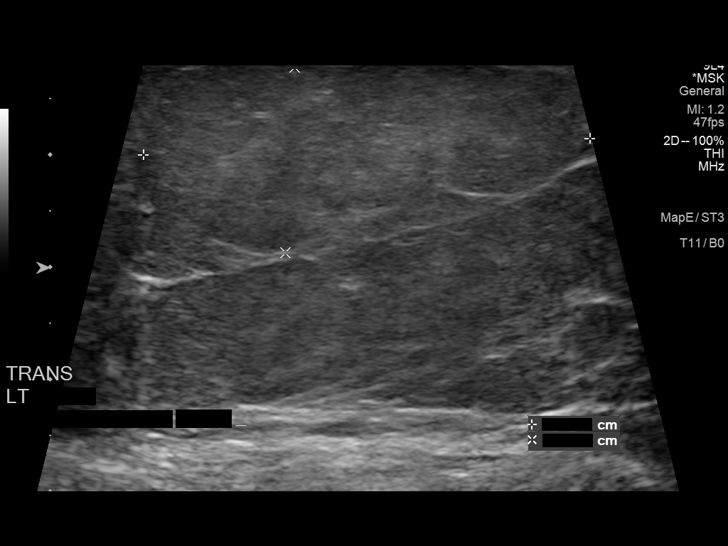
[im 3/12]
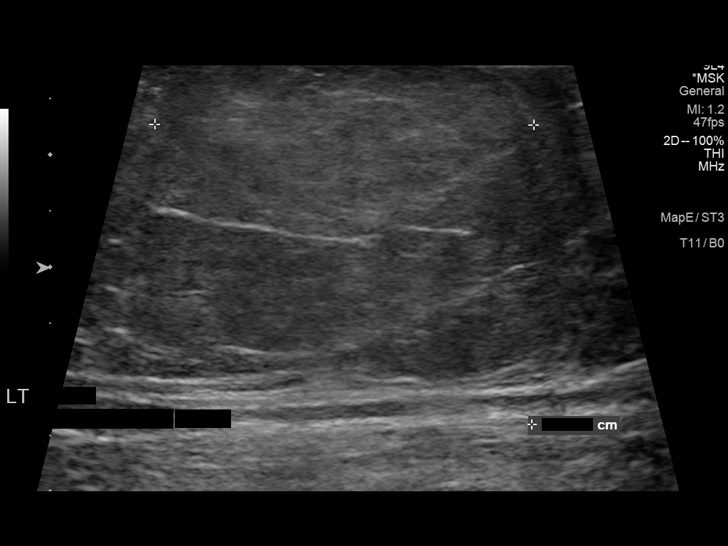
[im 4/12]
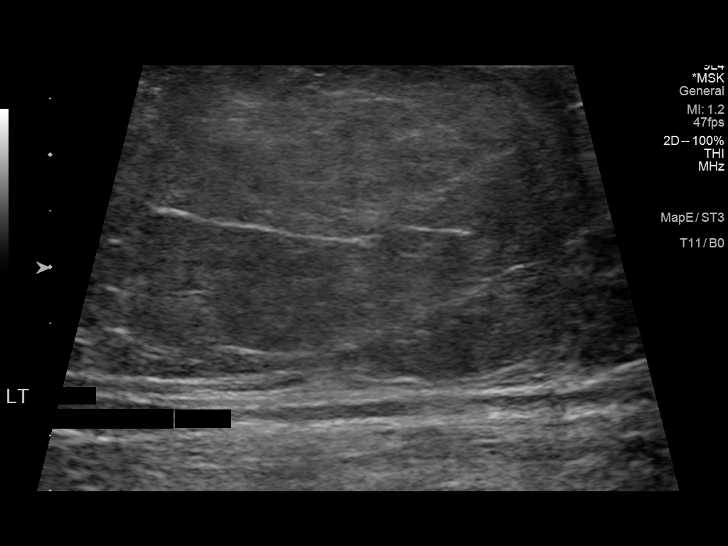
[im 5/12]
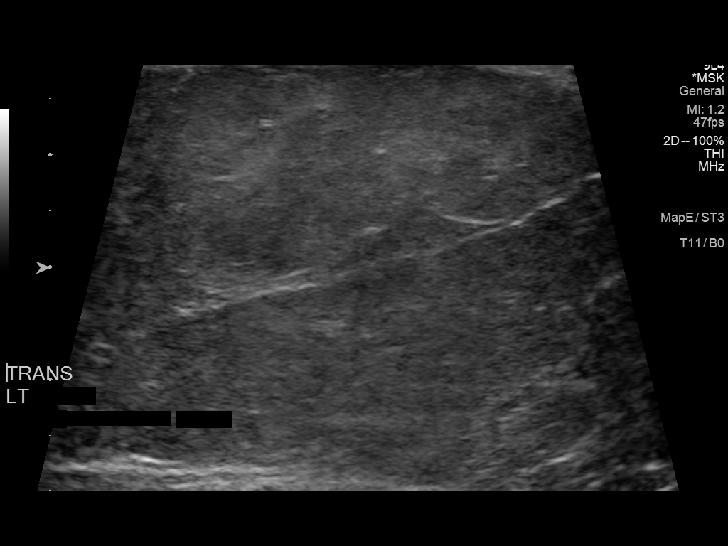
[im 6/12]
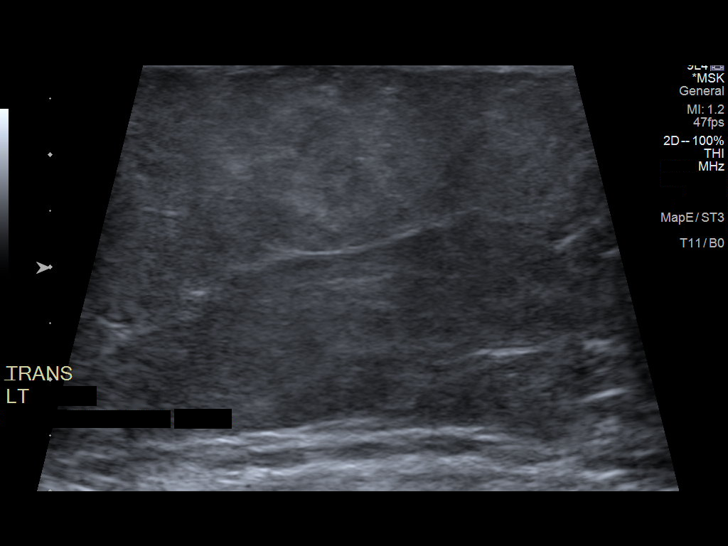
[im 7/12]
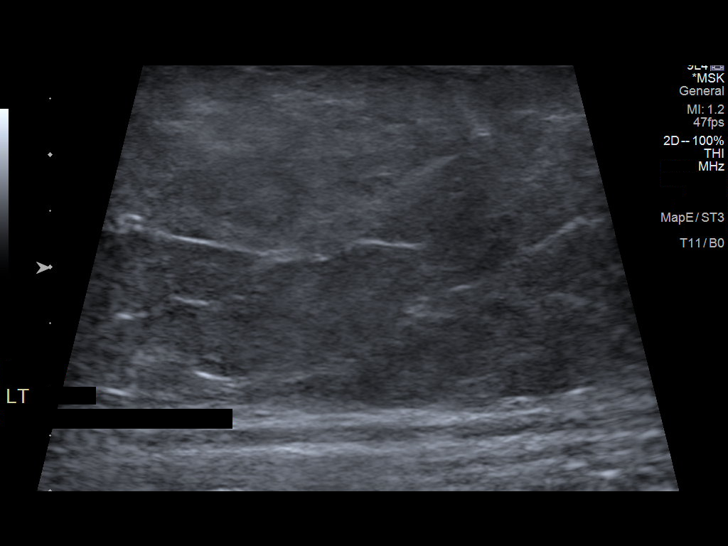
[im 8/12]
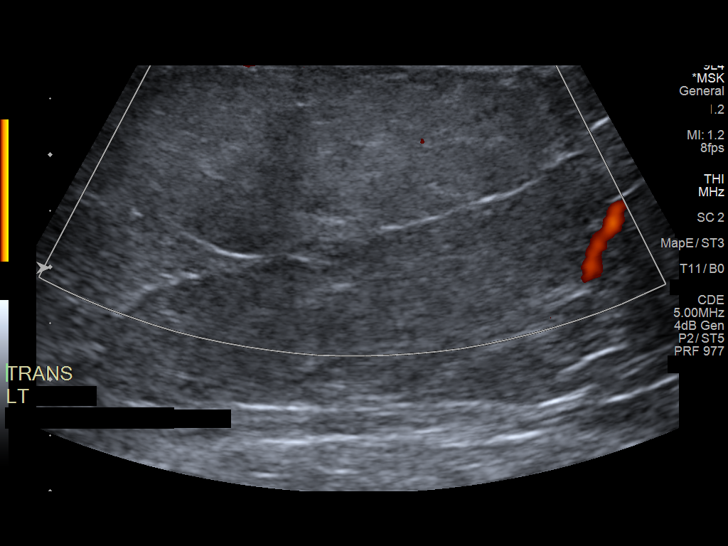
[im 9/12]
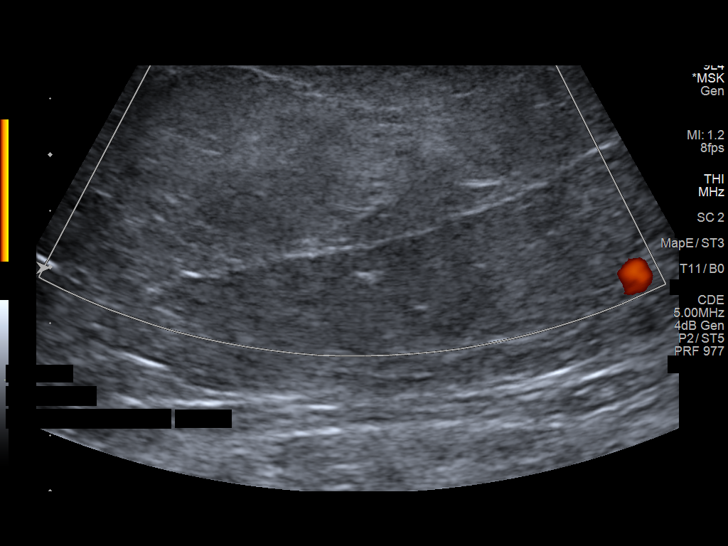
[im 10/12]
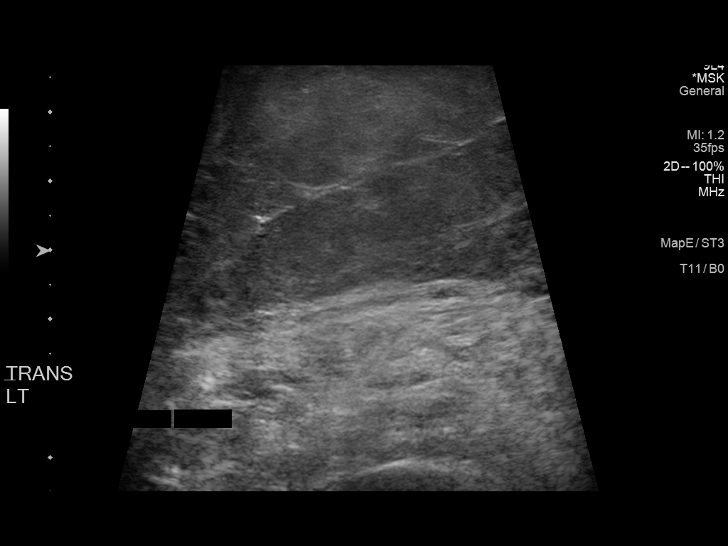
[im 11/12]
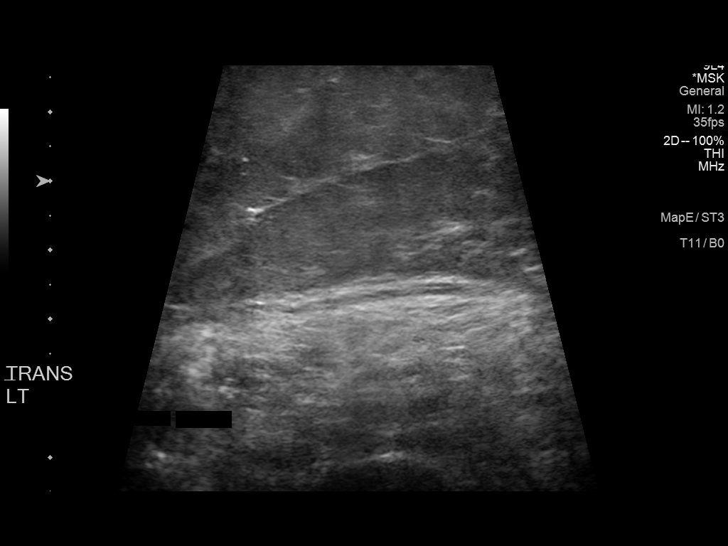
[im 12/12]
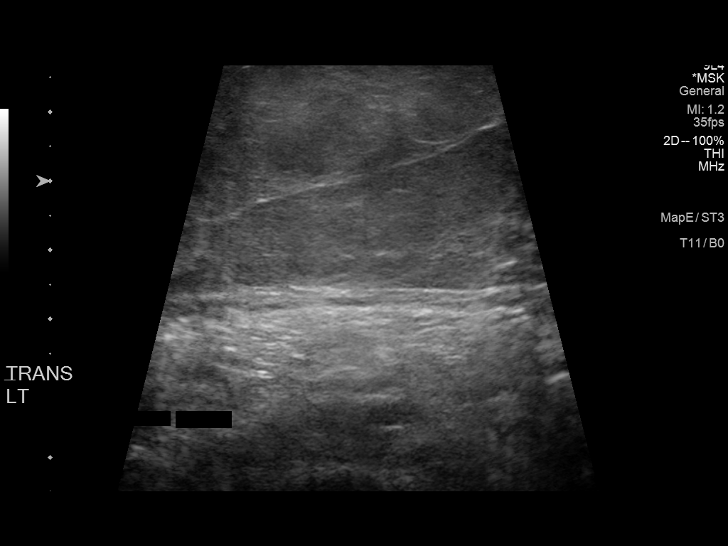

[12 of 12 positions shown; findings below may reference images not displayed]

FINDINGS: There is a solid mass at the site of palpable fullness in the left
proximal thigh region measuring 3.4 x 4.0 x 1.7 cm which is
reasonably homogeneous in echotexture and has echotexture marginally
increased compared to surrounding soft tissues. No other lesion
evident in this area. No fluid or inflammation.
IMPRESSION: Fairly homogeneous solid mass in the region of palpable fullness in
the left proximal thigh region. Suspect lipoma. No other lesion
evident.

From an imaging standpoint, MR would be the optimum study of choice
to confirm that this lesion represents a simple lipoma.

## 2020-10-02 ENCOUNTER — Other Ambulatory Visit: Payer: Self-pay | Admitting: Neurological Surgery

## 2020-10-02 DIAGNOSIS — M7989 Other specified soft tissue disorders: Secondary | ICD-10-CM

## 2020-10-02 DIAGNOSIS — M5416 Radiculopathy, lumbar region: Secondary | ICD-10-CM

## 2020-10-03 ENCOUNTER — Encounter: Payer: Self-pay | Admitting: Adult Health

## 2020-10-06 ENCOUNTER — Encounter (HOSPITAL_COMMUNITY): Payer: Self-pay | Admitting: Emergency Medicine

## 2020-10-06 ENCOUNTER — Inpatient Hospital Stay (HOSPITAL_COMMUNITY): Payer: Medicare Other

## 2020-10-06 ENCOUNTER — Encounter (HOSPITAL_COMMUNITY)
Admission: EM | Disposition: A | Discharge status: Home / Self Care | Payer: Self-pay | Source: Home / Self Care | Attending: Urology

## 2020-10-06 ENCOUNTER — Inpatient Hospital Stay (HOSPITAL_COMMUNITY)
Admission: EM | Admit: 2020-10-06 | Discharge: 2020-10-08 | DRG: 660 | Disposition: A | Discharge status: Home / Self Care | Payer: Medicare Other | Attending: Urology | Admitting: Urology

## 2020-10-06 ENCOUNTER — Other Ambulatory Visit: Payer: Self-pay

## 2020-10-06 ENCOUNTER — Emergency Department (HOSPITAL_COMMUNITY): Payer: Medicare Other | Admitting: Certified Registered"

## 2020-10-06 ENCOUNTER — Emergency Department (HOSPITAL_COMMUNITY): Payer: Medicare Other

## 2020-10-06 DIAGNOSIS — Z87442 Personal history of urinary calculi: Secondary | ICD-10-CM

## 2020-10-06 DIAGNOSIS — N12 Tubulo-interstitial nephritis, not specified as acute or chronic: Secondary | ICD-10-CM

## 2020-10-06 DIAGNOSIS — I10 Essential (primary) hypertension: Secondary | ICD-10-CM | POA: Diagnosis present

## 2020-10-06 DIAGNOSIS — K429 Umbilical hernia without obstruction or gangrene: Secondary | ICD-10-CM | POA: Diagnosis present

## 2020-10-06 DIAGNOSIS — E669 Obesity, unspecified: Secondary | ICD-10-CM | POA: Diagnosis present

## 2020-10-06 DIAGNOSIS — Z6833 Body mass index (BMI) 33.0-33.9, adult: Secondary | ICD-10-CM | POA: Diagnosis not present

## 2020-10-06 DIAGNOSIS — N993 Prolapse of vaginal vault after hysterectomy: Secondary | ICD-10-CM | POA: Diagnosis present

## 2020-10-06 DIAGNOSIS — N202 Calculus of kidney with calculus of ureter: Secondary | ICD-10-CM | POA: Diagnosis present

## 2020-10-06 DIAGNOSIS — N136 Pyonephrosis: Principal | ICD-10-CM | POA: Diagnosis present

## 2020-10-06 DIAGNOSIS — Z20822 Contact with and (suspected) exposure to covid-19: Secondary | ICD-10-CM | POA: Diagnosis present

## 2020-10-06 DIAGNOSIS — E119 Type 2 diabetes mellitus without complications: Secondary | ICD-10-CM | POA: Diagnosis present

## 2020-10-06 DIAGNOSIS — Z419 Encounter for procedure for purposes other than remedying health state, unspecified: Secondary | ICD-10-CM

## 2020-10-06 DIAGNOSIS — N200 Calculus of kidney: Secondary | ICD-10-CM

## 2020-10-06 DIAGNOSIS — N132 Hydronephrosis with renal and ureteral calculous obstruction: Secondary | ICD-10-CM | POA: Diagnosis present

## 2020-10-06 DIAGNOSIS — N201 Calculus of ureter: Secondary | ICD-10-CM | POA: Diagnosis present

## 2020-10-06 DIAGNOSIS — Z888 Allergy status to other drugs, medicaments and biological substances status: Secondary | ICD-10-CM | POA: Diagnosis not present

## 2020-10-06 HISTORY — PX: CYSTOSCOPY W/ URETERAL STENT PLACEMENT: SHX1429

## 2020-10-06 HISTORY — DX: Personal history of urinary calculi: Z87.442

## 2020-10-06 LAB — URINALYSIS, ROUTINE W REFLEX MICROSCOPIC
Bilirubin Urine: NEGATIVE
Glucose, UA: NEGATIVE mg/dL
Ketones, ur: 5 mg/dL — AB
Nitrite: NEGATIVE
Protein, ur: 100 mg/dL — AB
Specific Gravity, Urine: 1.013 (ref 1.005–1.030)
WBC, UA: >50 WBC/hpf — ABNORMAL HIGH (ref 0–5)
pH: 7 (ref 5.0–8.0)

## 2020-10-06 LAB — COMPREHENSIVE METABOLIC PANEL
ALT: 22 U/L (ref 0–44)
AST: 20 U/L (ref 15–41)
Albumin: 3.9 g/dL (ref 3.5–5.0)
Alkaline Phosphatase: 73 U/L (ref 38–126)
Anion gap: 13 (ref 5–15)
BUN: 17 mg/dL (ref 8–23)
CO2: 28 mmol/L (ref 22–32)
Calcium: 10.9 mg/dL — ABNORMAL HIGH (ref 8.9–10.3)
Chloride: 96 mmol/L — ABNORMAL LOW (ref 98–111)
Creatinine, Ser: 0.89 mg/dL (ref 0.44–1.00)
GFR, Estimated: >60 mL/min (ref >60)
Glucose, Bld: 182 mg/dL — ABNORMAL HIGH (ref 70–99)
Potassium: 3.9 mmol/L (ref 3.5–5.1)
Sodium: 137 mmol/L (ref 135–145)
Total Bilirubin: 0.8 mg/dL (ref 0.3–1.2)
Total Protein: 7.1 g/dL (ref 6.5–8.1)

## 2020-10-06 LAB — CBC
HCT: 43.1 % (ref 36.0–46.0)
Hemoglobin: 14.3 g/dL (ref 12.0–15.0)
MCH: 33 pg (ref 26.0–34.0)
MCHC: 33.2 g/dL (ref 30.0–36.0)
MCV: 99.5 fL (ref 80.0–100.0)
Platelets: 229 10*3/uL (ref 150–400)
RBC: 4.33 MIL/uL (ref 3.87–5.11)
RDW: 13 % (ref 11.5–15.5)
WBC: 13 10*3/uL — ABNORMAL HIGH (ref 4.0–10.5)
nRBC: 0 % (ref 0.0–0.2)

## 2020-10-06 LAB — GLUCOSE, CAPILLARY
Glucose-Capillary: 160 mg/dL — ABNORMAL HIGH (ref 70–99)
Glucose-Capillary: 172 mg/dL — ABNORMAL HIGH (ref 70–99)

## 2020-10-06 LAB — LACTIC ACID, PLASMA: Lactic Acid, Venous: 1.5 mmol/L (ref 0.5–1.9)

## 2020-10-06 LAB — TROPONIN I (HIGH SENSITIVITY): Troponin I (High Sensitivity): 15 ng/L (ref <18)

## 2020-10-06 LAB — SARS CORONAVIRUS 2 BY RT PCR (HOSPITAL ORDER, PERFORMED IN ~~LOC~~ HOSPITAL LAB): SARS Coronavirus 2: NEGATIVE

## 2020-10-06 LAB — LIPASE, BLOOD: Lipase: 26 U/L (ref 11–51)

## 2020-10-06 LAB — CBG MONITORING, ED: Glucose-Capillary: 148 mg/dL — ABNORMAL HIGH (ref 70–99)

## 2020-10-06 IMAGING — CT CT ABD-PELV W/ CM
2 of 5 series · 16 of 46 positions shown, 18 images · IV contrast (APPLIED)
Comparison: [DATE]

CLINICAL DATA: RIGHT-side abdominal and flank pain, sick since
yesterday, dysuria, question pyelonephritis

EXAM:
CT ABDOMEN AND PELVIS WITH CONTRAST
TECHNIQUE: Multidetector CT imaging of the abdomen and pelvis was performed
using the standard protocol following bolus administration of
intravenous contrast.
CONTRAST:  100mL OMNIPAQUE IOHEXOL 300 MG/ML SOLN IV. No oral
contrast.

[Series 3: abdomen 5.0 · axial · 0.83mm/px · z∈[+724,+1109]mm · 13 of 89 slices shown, 15 images]
[im 6/89  soft-tissue]
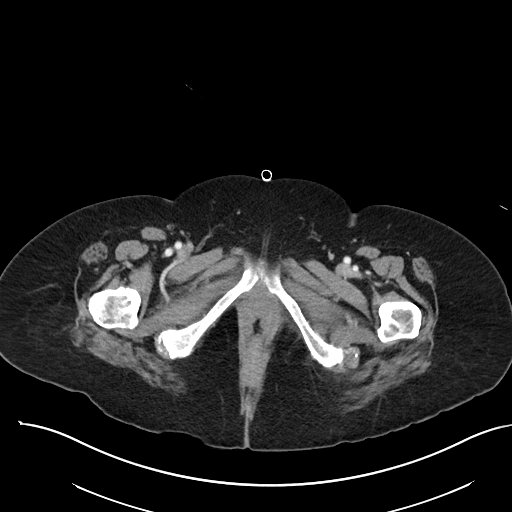
[im 6/89  bone]
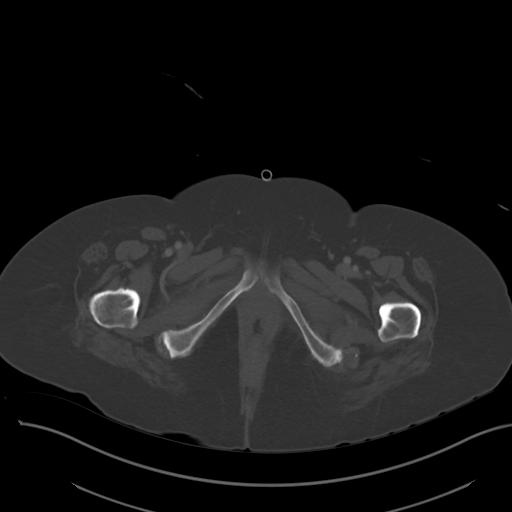
[im 12/89  soft-tissue]
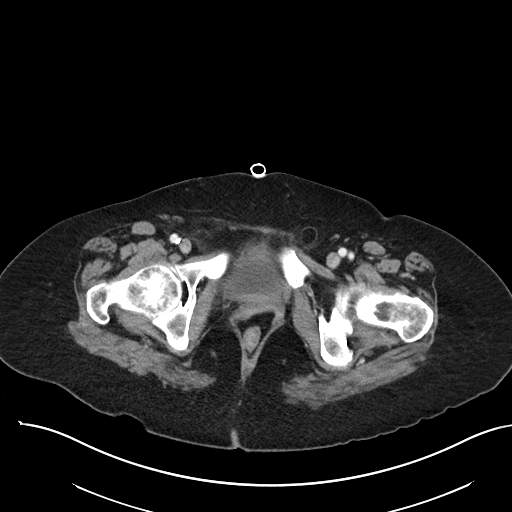
[im 18/89  soft-tissue]
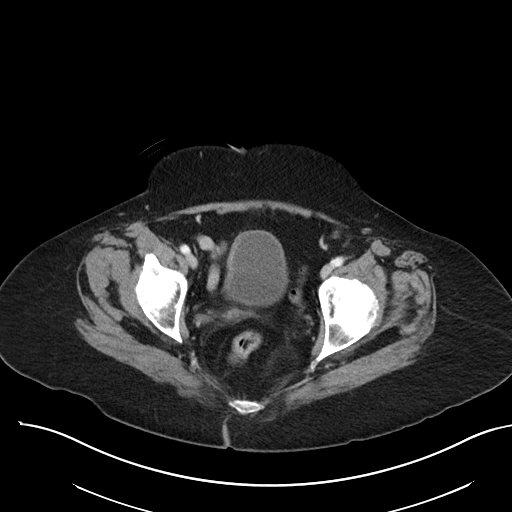
[im 24/89  soft-tissue]
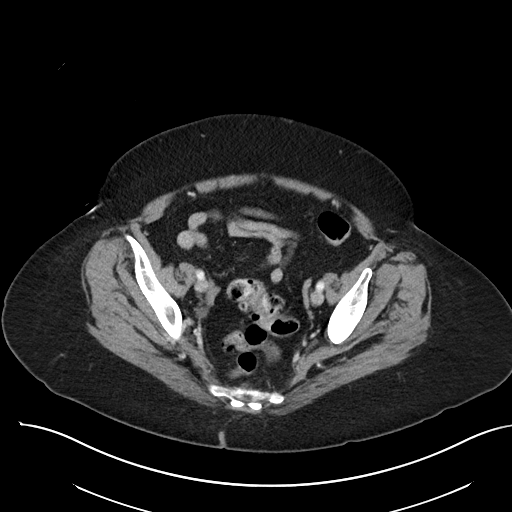
[im 30/89  soft-tissue]
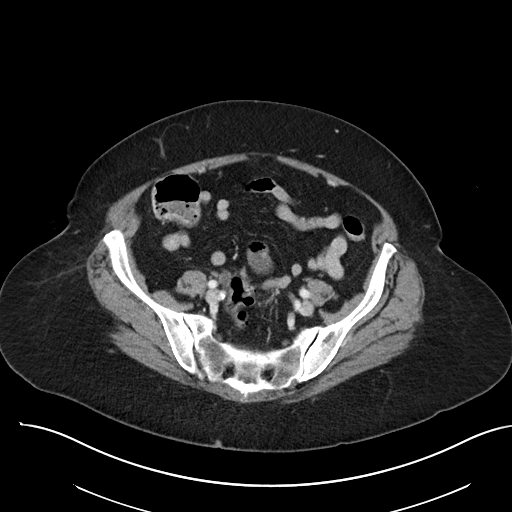
[im 36/89  soft-tissue]
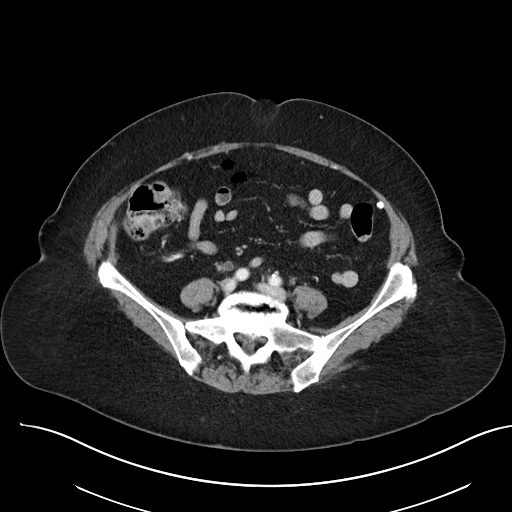
[im 47/89  soft-tissue]
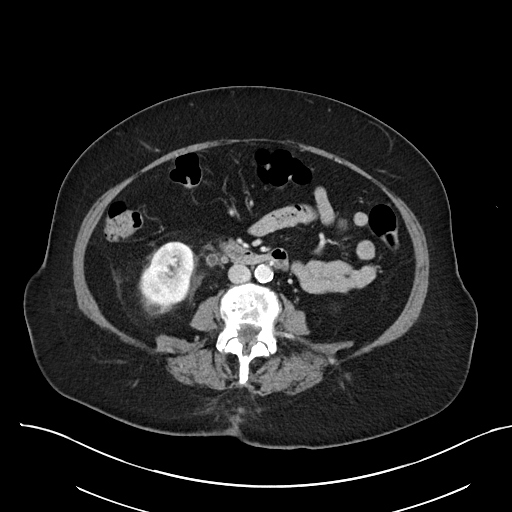
[im 53/89  soft-tissue]
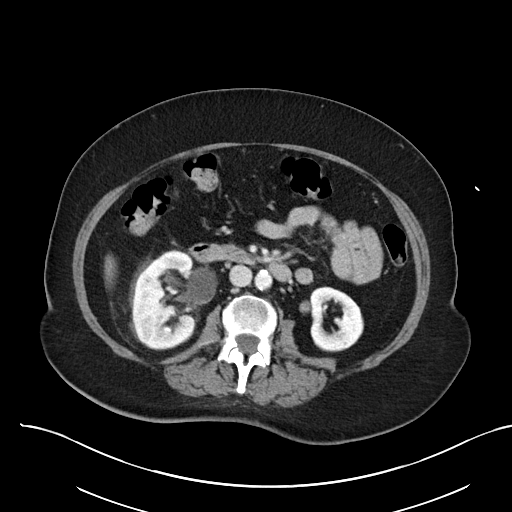
[im 59/89  soft-tissue]
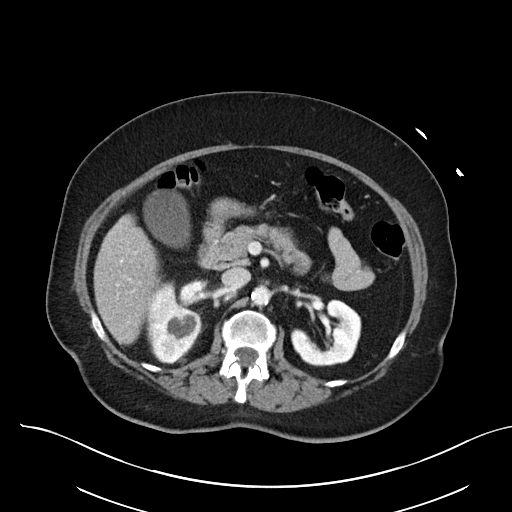
[im 59/89  bone]
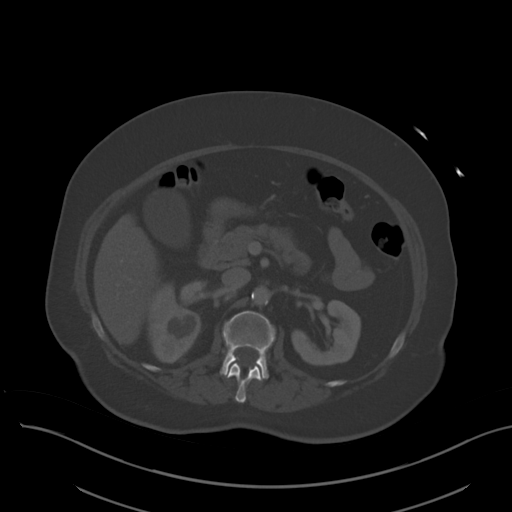
[im 65/89  soft-tissue]
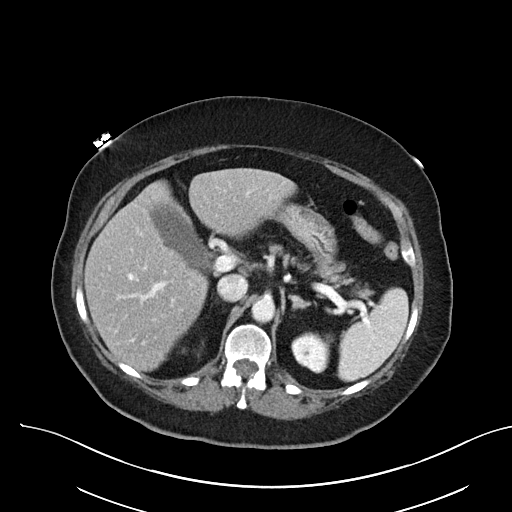
[im 71/89  soft-tissue]
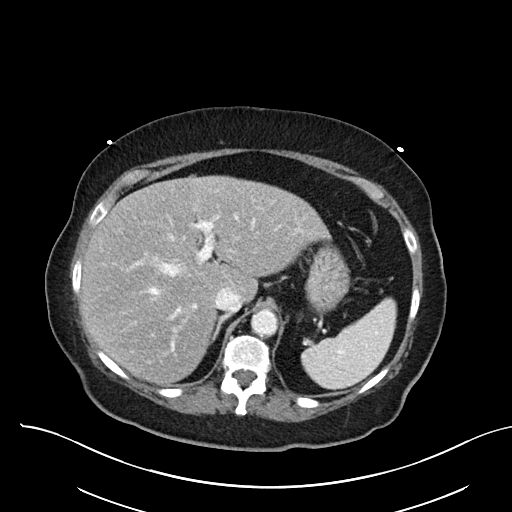
[im 77/89  soft-tissue]
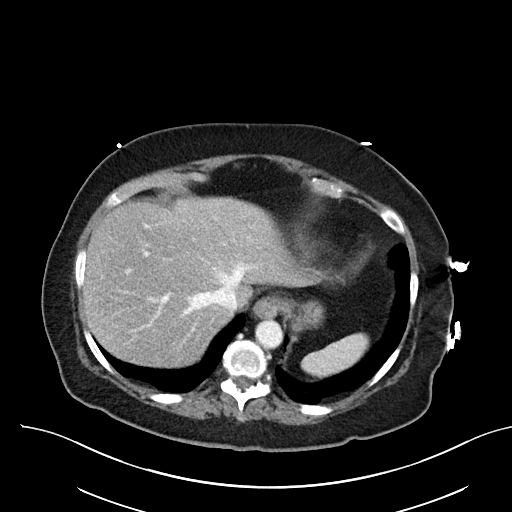
[im 83/89  soft-tissue]
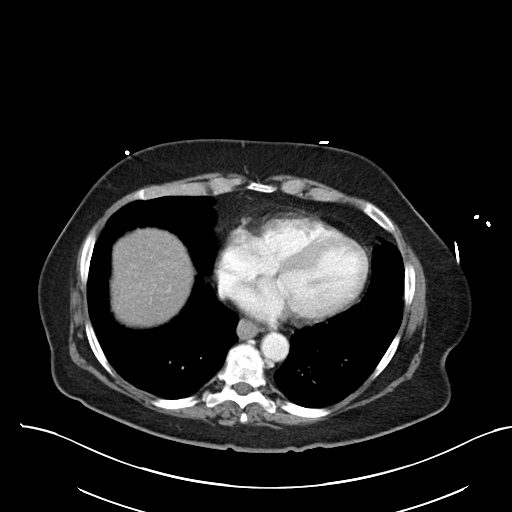

[Series 6: abdomen 3.0 mpr cor · coronal · 0.81mm/px · 3 of 102 slices shown]
[im 34/102  soft-tissue]
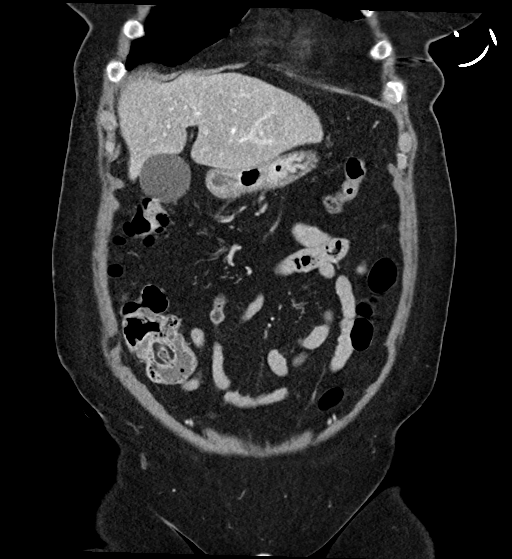
[im 45/102  soft-tissue]
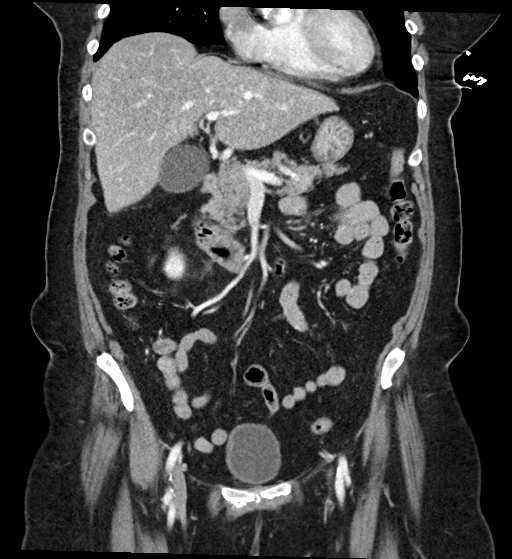
[im 57/102  soft-tissue]
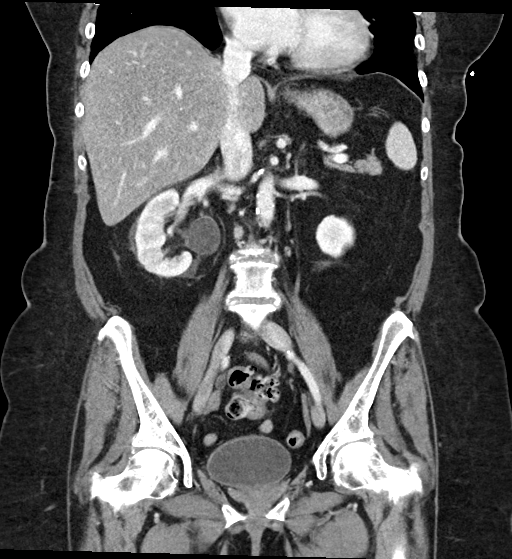

[16 of 46 positions shown; findings below may reference images not displayed]

FINDINGS: Lower chest: Minimal LEFT basilar atelectasis

Hepatobiliary: Minimal fatty infiltration of liver. Gallbladder
unremarkable. No biliary dilatation.

Pancreas: Normal appearance

Spleen: Normal appearance

Adrenals/Urinary Tract: Adrenal glands normal appearance. Small cyst
at inferior pole LEFT kidney without additional LEFT renal mass
hydronephrosis. Cyst at upper pole RIGHT kidney, mildly complicated
by a partial septation versus mural nodularity and a few
calcifications, measures 2.5 x 1.9 by 2.6 cm. Additional small
calculi within RIGHT kidney up to 7 mm diameter image 35. Small cyst
at inferior pole RIGHT kidney 13 mm diameter. Delayed RIGHT
nephrogram with RIGHT perinephric edema and a poorly defined area of
abnormal nephrogram at the lateral aspect of the mid inferior RIGHT
kidney suspicious for pyelonephritis. Mild RIGHT hydronephrosis
secondary to a 4 mm RIGHT UPJ calculus image 44. Minimal enhancement
of the renal pelvic wall could reflect infection. Delayed images
show filling defect within contrast at the renal pelvis, question
blood, debris, papillary necrosis, less likely tumor. RIGHT ureter
dilated to the urinary bladder. Tiny bladder calculus. Bladder and
LEFT ureter otherwise unremarkable.

Stomach/Bowel: Stomach and bowel loops normal appearance. Appendix
surgically absent by history.

Vascular/Lymphatic: Small pelvic phlebolith posterior to bladder.
Atherosclerotic calcifications aorta and iliac arteries without
aneurysm. No adenopathy.

Reproductive: Uterus surgically absent.  Atrophic ovaries.

Other: Umbilical hernia containing fat.  No free air or free fluid.

Musculoskeletal: Unremarkable
IMPRESSION: Mild RIGHT hydronephrosis and hydroureter secondary to a 4 mm RIGHT
UPJ calculus, associated with delay in RIGHT nephrogram.

Poorly defined area of abnormal nephrogram at the lateral aspect of
the mid inferior RIGHT kidney question pyelonephritis.

Filling defect within contrast at the RIGHT renal pelvis, question
blood, debris, papillary necrosis, less likely tumor

Additional nonobstructing RIGHT renal calculi and small BILATERAL
renal cysts.

Recommend correlation with urinalysis.

Tiny bladder calculus.

Umbilical hernia containing fat.

Aortic Atherosclerosis ([1K]-[1K]).

## 2020-10-06 IMAGING — RF DG RETROGRADE PYELOGRAM
1 series · 2 of 2 positions shown · non-contrast
Comparison: CT from earlier in the same day.

CLINICAL DATA: Right ureteral stone

EXAM:
RETROGRADE PYELOGRAM

[Series 1: run · 2 of 2 slices shown]
[im 1/2]
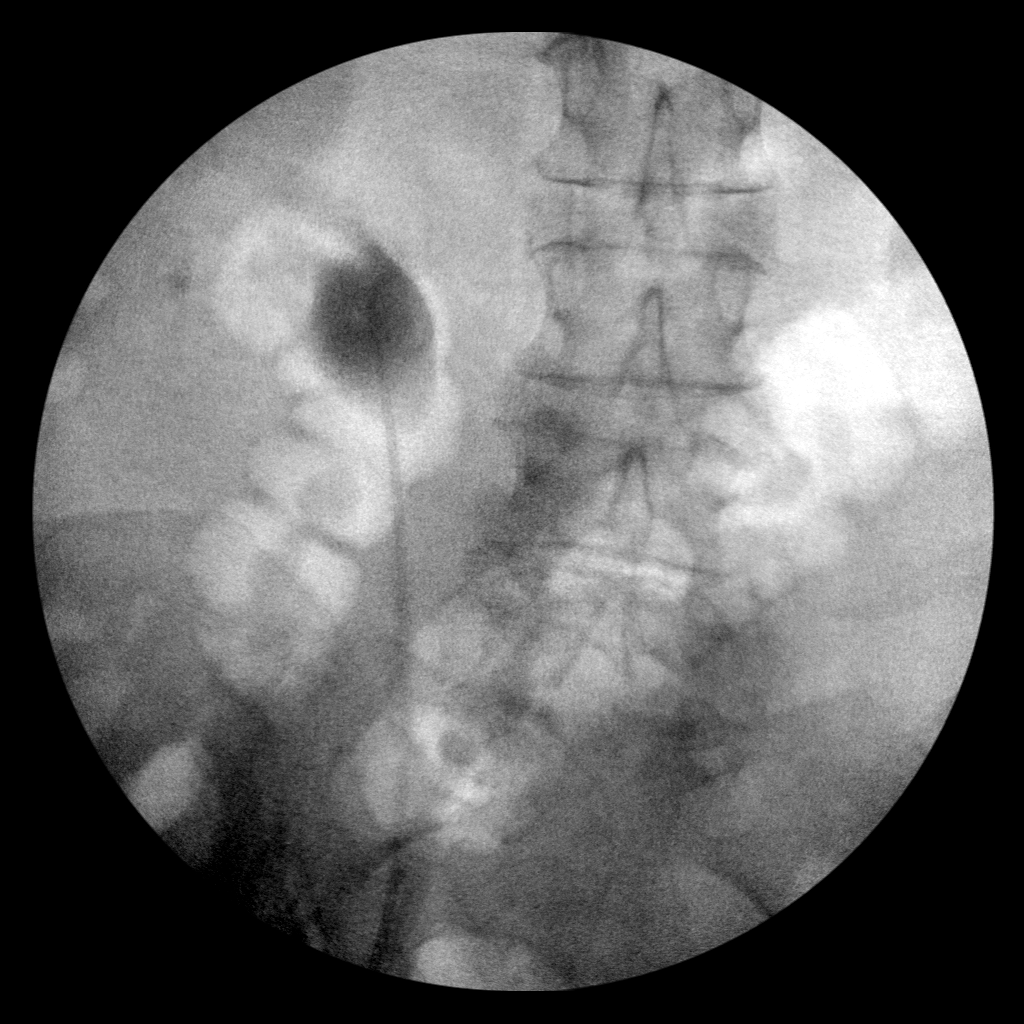
[im 2/2]
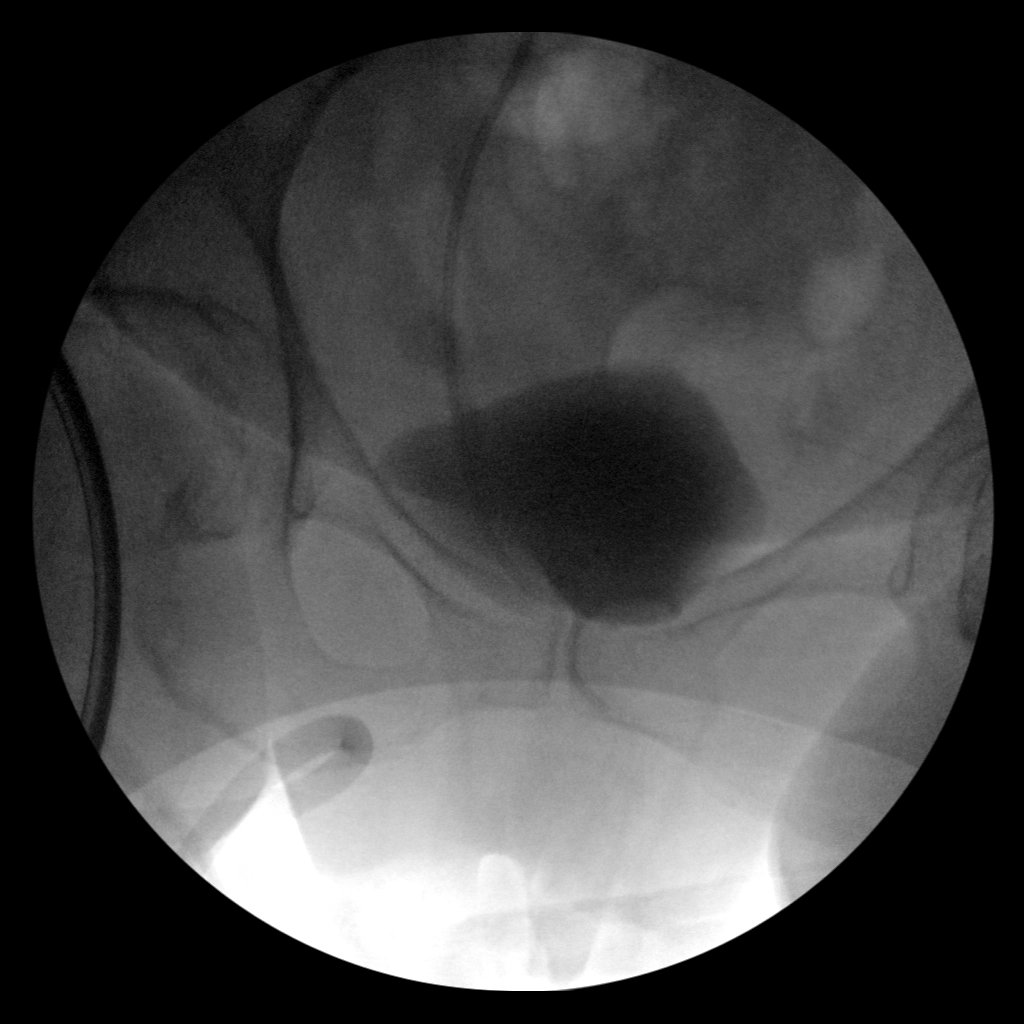

[2 of 2 positions shown; findings below may reference images not displayed]

FINDINGS: Contrast is noted within the collecting system and bladder. Ureteral
stent is noted in satisfactory position on the right. Previously
seen stone is not well appreciated. 22 seconds of fluoroscopy was
utilized.
IMPRESSION: Right ureteral stent in satisfactory position.

## 2020-10-06 SURGERY — CYSTOSCOPY, WITH RETROGRADE PYELOGRAM AND URETERAL STENT INSERTION
Anesthesia: General | Site: Ureter | Laterality: Right

## 2020-10-06 MED ORDER — PROPOFOL 10 MG/ML IV BOLUS
INTRAVENOUS | Status: AC
  Filled 2020-10-06: qty 20

## 2020-10-06 MED ORDER — IOHEXOL 300 MG/ML  SOLN
INTRAMUSCULAR | Status: DC | PRN
  Administered 2020-10-06: 10 mL via URETHRAL

## 2020-10-06 MED ORDER — OXYCODONE HCL 5 MG PO TABS
5.0000 mg | ORAL_TABLET | ORAL | Status: DC | PRN
  Administered 2020-10-06 – 2020-10-08 (×4): 5 mg via ORAL
  Filled 2020-10-06 (×4): qty 1

## 2020-10-06 MED ORDER — ACETAMINOPHEN 325 MG PO TABS
650.0000 mg | ORAL_TABLET | Freq: Once | ORAL | Status: AC
  Administered 2020-10-06: 650 mg via ORAL
  Filled 2020-10-06: qty 2

## 2020-10-06 MED ORDER — LIDOCAINE 2% (20 MG/ML) 5 ML SYRINGE
INTRAMUSCULAR | Status: DC | PRN
  Administered 2020-10-06: 60 mg via INTRAVENOUS

## 2020-10-06 MED ORDER — MORPHINE SULFATE (PF) 2 MG/ML IV SOLN
2.0000 mg | Freq: Once | INTRAVENOUS | Status: AC
Start: 2020-10-06 — End: 2020-10-06
  Administered 2020-10-06: 2 mg via INTRAVENOUS
  Filled 2020-10-06: qty 1

## 2020-10-06 MED ORDER — SODIUM CHLORIDE 0.9 % IR SOLN
Status: DC | PRN
  Administered 2020-10-06: 3000 mL

## 2020-10-06 MED ORDER — SODIUM CHLORIDE 0.9 % IV SOLN
2.0000 g | Freq: Once | INTRAVENOUS | Status: AC
  Administered 2020-10-06: 2 g via INTRAVENOUS
  Filled 2020-10-06: qty 20

## 2020-10-06 MED ORDER — SODIUM CHLORIDE 0.9 % IV SOLN: INTRAVENOUS | Status: DC

## 2020-10-06 MED ORDER — INSULIN ASPART 100 UNIT/ML ~~LOC~~ SOLN
0.0000 [IU] | Freq: Three times a day (TID) | SUBCUTANEOUS | Status: DC
  Administered 2020-10-07: 2 [IU] via SUBCUTANEOUS
  Administered 2020-10-07: 3 [IU] via SUBCUTANEOUS
  Administered 2020-10-07 – 2020-10-08 (×3): 2 [IU] via SUBCUTANEOUS

## 2020-10-06 MED ORDER — FENTANYL CITRATE (PF) 250 MCG/5ML IJ SOLN
INTRAMUSCULAR | Status: AC
  Filled 2020-10-06: qty 5

## 2020-10-06 MED ORDER — FLUTICASONE PROPIONATE 50 MCG/ACT NA SUSP
2.0000 | Freq: Every day | NASAL | Status: DC
  Administered 2020-10-07 – 2020-10-08 (×2): 2 via NASAL
  Filled 2020-10-06: qty 16

## 2020-10-06 MED ORDER — SENNOSIDES-DOCUSATE SODIUM 8.6-50 MG PO TABS
1.0000 | ORAL_TABLET | Freq: Two times a day (BID) | ORAL | Status: DC
  Administered 2020-10-07 – 2020-10-08 (×3): 1 via ORAL
  Filled 2020-10-06 (×3): qty 1

## 2020-10-06 MED ORDER — BISOPROLOL-HYDROCHLOROTHIAZIDE 2.5-6.25 MG PO TABS
1.0000 | ORAL_TABLET | Freq: Every day | ORAL | Status: DC
  Administered 2020-10-07 – 2020-10-08 (×2): 1 via ORAL
  Filled 2020-10-06 (×2): qty 1

## 2020-10-06 MED ORDER — LINAGLIPTIN 5 MG PO TABS
5.0000 mg | ORAL_TABLET | Freq: Every day | ORAL | Status: DC
  Administered 2020-10-07 – 2020-10-08 (×2): 5 mg via ORAL
  Filled 2020-10-06 (×2): qty 1

## 2020-10-06 MED ORDER — LIDOCAINE 2% (20 MG/ML) 5 ML SYRINGE
INTRAMUSCULAR | Status: AC
  Filled 2020-10-06: qty 5

## 2020-10-06 MED ORDER — ONDANSETRON HCL 4 MG/2ML IJ SOLN: 4.0000 mg | Freq: Once | INTRAMUSCULAR | Status: DC | PRN

## 2020-10-06 MED ORDER — SODIUM CHLORIDE 0.9 % IV SOLN: INTRAVENOUS | Status: DC | PRN

## 2020-10-06 MED ORDER — SODIUM CHLORIDE 0.9 % IV BOLUS
1000.0000 mL | Freq: Once | INTRAVENOUS | Status: AC
  Administered 2020-10-06: 1000 mL via INTRAVENOUS

## 2020-10-06 MED ORDER — FENTANYL CITRATE (PF) 100 MCG/2ML IJ SOLN: 25.0000 ug | INTRAMUSCULAR | Status: DC | PRN

## 2020-10-06 MED ORDER — ONDANSETRON HCL 4 MG/2ML IJ SOLN
4.0000 mg | Freq: Once | INTRAMUSCULAR | Status: AC
  Administered 2020-10-06: 4 mg via INTRAVENOUS
  Filled 2020-10-06: qty 2

## 2020-10-06 MED ORDER — HYDROMORPHONE HCL 1 MG/ML IJ SOLN: 0.5000 mg | INTRAMUSCULAR | Status: DC | PRN

## 2020-10-06 MED ORDER — ACETAMINOPHEN 500 MG PO TABS
1000.0000 mg | ORAL_TABLET | Freq: Three times a day (TID) | ORAL | Status: AC
  Administered 2020-10-06: 1000 mg via ORAL
  Filled 2020-10-06: qty 2

## 2020-10-06 MED ORDER — INSULIN ASPART 100 UNIT/ML ~~LOC~~ SOLN: 0.0000 [IU] | Freq: Every day | SUBCUTANEOUS | Status: DC

## 2020-10-06 MED ORDER — SIMVASTATIN 20 MG PO TABS
10.0000 mg | ORAL_TABLET | Freq: Every morning | ORAL | Status: DC
  Administered 2020-10-07 – 2020-10-08 (×2): 10 mg via ORAL
  Filled 2020-10-06 (×2): qty 1

## 2020-10-06 MED ORDER — IOHEXOL 300 MG/ML  SOLN
100.0000 mL | Freq: Once | INTRAMUSCULAR | Status: AC | PRN
  Administered 2020-10-06: 100 mL via INTRAVENOUS

## 2020-10-06 MED ORDER — PROPOFOL 10 MG/ML IV BOLUS
INTRAVENOUS | Status: DC | PRN
  Administered 2020-10-06: 150 mg via INTRAVENOUS

## 2020-10-06 MED ORDER — BISOPROLOL-HYDROCHLOROTHIAZIDE 2.5-6.25 MG PO TABS: 1.0000 | ORAL_TABLET | Freq: Every day | ORAL | Status: DC

## 2020-10-06 MED ORDER — SODIUM CHLORIDE 0.9 % IV SOLN
2.0000 g | INTRAVENOUS | Status: DC
  Administered 2020-10-07 – 2020-10-08 (×2): 2 g via INTRAVENOUS
  Filled 2020-10-06: qty 20
  Filled 2020-10-06: qty 2

## 2020-10-06 MED ORDER — ONDANSETRON HCL 4 MG/2ML IJ SOLN
INTRAMUSCULAR | Status: DC | PRN
  Administered 2020-10-06: 4 mg via INTRAVENOUS

## 2020-10-06 MED ORDER — ACETAMINOPHEN 10 MG/ML IV SOLN: 1000.0000 mg | Freq: Once | INTRAVENOUS | Status: DC | PRN

## 2020-10-06 SURGICAL SUPPLY — 23 items
BAG DRN RND TRDRP ANRFLXCHMBR (UROLOGICAL SUPPLIES) ×1
BAG URINE DRAIN 2000ML AR STRL (UROLOGICAL SUPPLIES) ×2 IMPLANT
BAG URO CATCHER STRL LF (MISCELLANEOUS) ×2 IMPLANT
CATH FOLEY 2WAY SLVR  5CC 16FR (CATHETERS)
CATH FOLEY 2WAY SLVR 5CC 16FR (CATHETERS) IMPLANT
CATH INTERMIT  6FR 70CM (CATHETERS) ×2 IMPLANT
GLOVE BIOGEL M 8.0 STRL (GLOVE) ×2 IMPLANT
GOWN STRL REUS W/ TWL LRG LVL3 (GOWN DISPOSABLE) ×1 IMPLANT
GOWN STRL REUS W/ TWL XL LVL3 (GOWN DISPOSABLE) ×1 IMPLANT
GOWN STRL REUS W/TWL LRG LVL3 (GOWN DISPOSABLE) ×2
GOWN STRL REUS W/TWL XL LVL3 (GOWN DISPOSABLE) ×2
GUIDEWIRE ANG ZIPWIRE 038X150 (WIRE) IMPLANT
GUIDEWIRE STR DUAL SENSOR (WIRE) ×2 IMPLANT
KIT TURNOVER KIT B (KITS) ×2 IMPLANT
MANIFOLD NEPTUNE II (INSTRUMENTS) IMPLANT
NS IRRIG 1000ML POUR BTL (IV SOLUTION) IMPLANT
PACK CYSTO (CUSTOM PROCEDURE TRAY) ×2 IMPLANT
STENT URET 6FRX24 CONTOUR (STENTS) ×2 IMPLANT
STENT URET 6FRX26 CONTOUR (STENTS) IMPLANT
SYPHON OMNI JUG (MISCELLANEOUS) ×2 IMPLANT
TOWEL GREEN STERILE FF (TOWEL DISPOSABLE) ×2 IMPLANT
TUBE CONNECTING 12X1/4 (SUCTIONS) IMPLANT
WATER STERILE IRR 3000ML UROMA (IV SOLUTION) ×2 IMPLANT

## 2020-10-06 NOTE — ED Notes (Signed)
Urine sent to lab WITH a culture.  

## 2020-10-06 NOTE — Anesthesia Preprocedure Evaluation (Addendum)
Anesthesia Evaluation  Patient identified by MRN, date of birth, ID band Patient awake    Reviewed: Allergy & Precautions, NPO status , Patient's Chart, lab work & pertinent test results  Airway Mallampati: II  TM Distance: >3 FB Neck ROM: Full    Dental  (+) Caps   Pulmonary neg pulmonary ROS,    Pulmonary exam normal breath sounds clear to auscultation       Cardiovascular hypertension, Pt. on home beta blockers and Pt. on medications Normal cardiovascular exam Rhythm:Regular Rate:Normal  ECG: SR, rate 91   Neuro/Psych negative neurological ROS  negative psych ROS   GI/Hepatic negative GI ROS, Neg liver ROS,   Endo/Other  diabetes, Oral Hypoglycemic Agents  Renal/GU negative Renal ROS     Musculoskeletal negative musculoskeletal ROS (+)   Abdominal (+) + obese,   Peds  Hematology HLD   Anesthesia Other Findings ureteral stone right  Reproductive/Obstetrics                            Anesthesia Physical Anesthesia Plan  ASA: III and emergent  Anesthesia Plan: General   Post-op Pain Management:    Induction: Intravenous  PONV Risk Score and Plan: 4 or greater and Ondansetron, Dexamethasone and Treatment may vary due to age or medical condition  Airway Management Planned: LMA  Additional Equipment:   Intra-op Plan:   Post-operative Plan: Extubation in OR  Informed Consent: I have reviewed the patients History and Physical, chart, labs and discussed the procedure including the risks, benefits and alternatives for the proposed anesthesia with the patient or authorized representative who has indicated his/her understanding and acceptance.     Dental advisory given  Plan Discussed with: CRNA  Anesthesia Plan Comments:        Anesthesia Quick Evaluation

## 2020-10-06 NOTE — ED Provider Notes (Signed)
[facility] EMERGENCY DEPARTMENT Provider Note   CSN: [id number] Arrival date & time: 10/06/20  0350     History No chief complaint on file.   Ann Thompson is a 80 y.o. female with PMhx HTN, Diabetes who presents to the ED today with complaint of gradual onset, constant, sharp, right flank pain that began last night. Pt also complains of nausea and 1 episode of NBNB emesis. She reports history of kidney stones several years ago however she is unsure if this pain feels similar - she believes she passed the stone on her own. She also mentions burning when she urinates that started yesterday. Pt denies fevers, chills, diarrhea, urinary frequency, or any other associated symptoms. PSHx appendectomy.   The history is provided by the patient and medical records.       Past Medical History:  Diagnosis Date  . Arthritis   . Diabetes mellitus (HCC)   . Hypertension     Patient Active Problem List   Diagnosis Date Noted  . Hypertension     Past Surgical History:  Procedure Laterality Date  . ABDOMINAL HYSTERECTOMY  1982  . APPENDECTOMY  1957  . left knee surgery        OB History   No obstetric history on file.     Family History  Family history unknown: Yes    Social History   Tobacco Use  . Smoking status: Never Smoker  . Smokeless tobacco: Never Used  Substance Use Topics  . Alcohol use: Never  . Drug use: Never    Home Medications Prior to Admission medications   Medication Sig Start Date End Date Taking? Authorizing Provider  bisoprolol-hydrochlorothiazide (ZIAC) 2.5-6.25 MG tablet Take 1 tablet by mouth daily. 07/12/20   Nafziger, [name], NP  fluticasone (FLONASE) 50 MCG/ACT nasal spray Place 2 sprays into both nostrils daily. 06/22/20   Nafziger, [name], NP  glucosamine-chondroitin 500-400 MG tablet Take 1 tablet by mouth 3 (three) times daily.    [provider]  Multiple Vitamin (MULTIVITAMIN) tablet Take 1 tablet by mouth daily.     [provider]  pramipexole (MIRAPEX) 1 MG tablet Take 1 mg by mouth 3 (three) times daily.    [provider]  simvastatin (ZOCOR) 10 MG tablet Take 1 tablet (10 mg total) by mouth at bedtime. 09/25/20   Nafziger, [name], NP  sitaGLIPtin (JANUVIA) 25 MG tablet Take 1 tablet (25 mg total) by mouth daily. 09/21/20   Nafziger, [name], NP  triamterene-hydrochlorothiazide (DYAZIDE) 37.5-25 MG capsule Take 1 capsule by mouth daily.    [provider]    Allergies    Diclofenac and Metformin  Review of Systems   Review of Systems  Constitutional: Negative for chills and fever.  Gastrointestinal: Positive for nausea and vomiting.  Genitourinary: Positive for dysuria and flank pain.  All other systems reviewed and are negative.   Physical Exam Updated Vital Signs BP 112/77   Pulse 87   Temp 98.8 F (37.1 C) (Oral)   Resp 18   SpO2 95%   Physical Exam Vitals and nursing note reviewed.  Constitutional:      Appearance: She is not ill-appearing.  HENT:     Head: Normocephalic and atraumatic.  Eyes:     Conjunctiva/sclera: Conjunctivae normal.  Cardiovascular:     Rate and Rhythm: Normal rate and regular rhythm.     Pulses: Normal pulses.  Pulmonary:     Effort: Pulmonary effort is normal.     Breath  sounds: Normal breath sounds. No wheezing, rhonchi or rales.  Abdominal:     Palpations: Abdomen is soft.     Tenderness: There is abdominal tenderness. There is right CVA tenderness. There is no guarding or rebound.     Comments: Soft, + R CVA and flank TTP, +BS throughout, no r/g/r, neg murphy's, neg mcburney's  Musculoskeletal:     Cervical back: Neck supple.  Skin:    General: Skin is warm and dry.  Neurological:     Mental Status: She is alert.     ED Results / Procedures / Treatments   Labs (all labs ordered are listed, but only abnormal results are displayed) Labs Reviewed  COMPREHENSIVE METABOLIC PANEL - Abnormal; Notable for the following  components:      Result Value   Chloride 96 (*)    Glucose, Bld 182 (*)    Calcium 10.9 (*)    All other components within normal limits  CBC - Abnormal; Notable for the following components:   WBC 13.0 (*)    All other components within normal limits  URINALYSIS, ROUTINE W REFLEX MICROSCOPIC - Abnormal; Notable for the following components:   APPearance HAZY (*)    Hgb urine dipstick SMALL (*)    Ketones, ur 5 (*)    Protein, ur 100 (*)    Leukocytes,Ua LARGE (*)    WBC, UA >50 (*)    Bacteria, UA RARE (*)    All other components within normal limits  URINE CULTURE  CULTURE, BLOOD (ROUTINE X 2)  CULTURE, BLOOD (ROUTINE X 2)  SARS CORONAVIRUS 2 BY RT PCR (HOSPITAL ORDER, PERFORMED IN [facility])  LIPASE, BLOOD  LACTIC ACID, PLASMA  TROPONIN I (HIGH SENSITIVITY)    EKG EKG Interpretation  Date/Time:  Saturday October 06 2020 13:37:54 EST Ventricular Rate:  91 PR Interval:    QRS Duration: 120 QT Interval:  377 QTC Calculation: 464 R Axis:   -70 Text Interpretation: Sinus rhythm Prolonged PR interval Probable left atrial enlargement Nonspecific IVCD with LAD Left ventricular hypertrophy Baseline wander No old tracing to compare Confirmed by Mancel Bale [phone]) on 10/06/2020 2:04:56 PM   Radiology CT Abdomen Pelvis W Contrast  Result Date: 10/06/2020 CLINICAL DATA:  RIGHT-side abdominal and flank pain, sick since yesterday, dysuria, question pyelonephritis EXAM: CT ABDOMEN AND PELVIS WITH CONTRAST TECHNIQUE: Multidetector CT imaging of the abdomen and pelvis was performed using the standard protocol following bolus administration of intravenous contrast. CONTRAST:  OMNIPAQUE IOHEXOL 300 MG/ML SOLN IV. No oral contrast. COMPARISON:  07/06/2020 FINDINGS: Lower chest: Minimal LEFT basilar atelectasis Hepatobiliary: Minimal fatty infiltration of liver. Gallbladder unremarkable. No biliary dilatation. Pancreas: Normal appearance Spleen: Normal appearance  Adrenals/Urinary Tract: Adrenal glands normal appearance. Small cyst at inferior pole LEFT kidney without additional LEFT renal mass hydronephrosis. Cyst at upper pole RIGHT kidney, mildly complicated by a partial septation versus mural nodularity and a few calcifications, measures 2.5 x 1.9 by 2.6 cm. Additional small calculi within RIGHT kidney up to 7 mm diameter image 35. Small cyst at inferior pole RIGHT kidney 13 mm diameter. Delayed RIGHT nephrogram with RIGHT perinephric edema and a poorly defined area of abnormal nephrogram at the lateral aspect of the mid inferior RIGHT kidney suspicious for pyelonephritis. Mild RIGHT hydronephrosis secondary to a 4 mm RIGHT UPJ calculus image 44. Minimal enhancement of the renal pelvic wall could reflect infection. Delayed images show filling defect within contrast at the renal pelvis, question blood, debris, papillary necrosis, less  likely tumor. RIGHT ureter dilated to the urinary bladder. Tiny bladder calculus. Bladder and LEFT ureter otherwise unremarkable. Stomach/Bowel: Stomach and bowel loops normal appearance. Appendix surgically absent by history. Vascular/Lymphatic: Small pelvic phlebolith posterior to bladder. Atherosclerotic calcifications aorta and iliac arteries without aneurysm. No adenopathy. Reproductive: Uterus surgically absent.  Atrophic ovaries. Other: Umbilical hernia containing fat.  No free air or free fluid. Musculoskeletal: Unremarkable IMPRESSION: Mild RIGHT hydronephrosis and hydroureter secondary to a 4 mm RIGHT UPJ calculus, associated with delay in RIGHT nephrogram. Poorly defined area of abnormal nephrogram at the lateral aspect of the mid inferior RIGHT kidney question pyelonephritis. Filling defect within contrast at the RIGHT renal pelvis, question blood, debris, papillary necrosis, less likely tumor Additional nonobstructing RIGHT renal calculi and small BILATERAL renal cysts. Recommend correlation with urinalysis. Tiny bladder  calculus. Umbilical hernia containing fat. Aortic Atherosclerosis (ICD10-I70.0). Electronically Signed   By: Ulyses Southward M.D.   On: 10/06/2020 14:52    Procedures Procedures   Medications Ordered in ED Medications  sodium chloride 0.9 % bolus 1,000 mL (0 mLs Intravenous Stopped 10/06/20 1530)  morphine 2 MG/ML injection 2 mg (2 mg Intravenous Given 10/06/20 1324)  ondansetron (ZOFRAN) injection 4 mg (4 mg Intravenous Given 10/06/20 1326)  cefTRIAXone (ROCEPHIN) 2 g in sodium chloride 0.9 % 100 mL IVPB (0 g Intravenous Stopped 10/06/20 1502)  iohexol (OMNIPAQUE) 300 MG/ML solution 100 mL (100 mLs Intravenous Contrast Given 10/06/20 1426)  acetaminophen (TYLENOL) tablet 650 mg (650 mg Oral Given 10/06/20 1507)    ED Course  I have reviewed the triage vital signs and the nursing notes.  Pertinent labs & imaging results that were available during my care of the patient were reviewed by me and considered in my medical decision making (see chart for details).    MDM Rules/Calculators/A&P                          79 year old female presents to the ED today complaining of right-sided flank pain as well as dysuria for the past day.  Has also had associated nausea and nonbloody nonbilious emesis.  History of kidney stones.  On arrival to the ED vitals are stable.  Patient is afebrile, nontachycardic nontachypneic.  Blood pressure slightly elevated at 163/73.  Lab work was obtained while patient was in the waiting room including a CBC with a leukocytosis of 13,000.  Lab work done 2 weeks ago with a white blood cell count normal at 9000.  Given leukocytosis will add on lactic acid at this time.  Still awaiting remainder of labs including the MP lipase and UA.  On exam patient does have right-sided CVA tenderness palpation as well is right flank tenderness.  She does have a history of appendectomy in the past.  Concern for pyelonephritis versus infected kidney stone at this time.  She will need a CT scan with  contrast given age as well as her [place] ptosis.  Will provide fluids, nausea medicine, pain medication.  CMP with a glucose 182, chloride 96.  Bicarb normal at 28.  No gap.  Creatinine stable at 0.89.  No other electrolyte abnormalities. Lipase within normal limits. UA with large leuks, 21-50 red blood cells, greater than 50 white blood cells with rare bacteria.  Consistent with UTI, will provide Rocephin at this time.  Will send for culture.  Still awaiting CT scan with concern for [place] given UTI and flank tenderness palpation with a leukocytosis. Pt may require admission  given age.   Nursing staff obtained EKG due to abnormalities seen on the monitor - reading as acute MI however evaluated by attending physician Dr. Effie Shy who does not agree with EKG read; pt without any chest pain or SOB. Low suspicion for ACS however will obtain troponin given EKG abnormality  Nursing staff informed that pt now has a fever - 102.0. Tylenol ordered.  CT scan: IMPRESSION:  Mild RIGHT hydronephrosis and hydroureter secondary to a 4 mm RIGHT  UPJ calculus, associated with delay in RIGHT nephrogram.    Poorly defined area of abnormal nephrogram at the lateral aspect of  the mid inferior RIGHT kidney question pyelonephritis.    Filling defect within contrast at the RIGHT renal pelvis, question  blood, debris, papillary necrosis, less likely tumor    Additional nonobstructing RIGHT renal calculi and small BILATERAL  renal cysts.    Recommend correlation with urinalysis.    Tiny bladder calculus.    Umbilical hernia containing fat.   Discussed case with Urologist Dr. Berneice Heinrich with concern for infected stone; recommends stenting in the OR. Will come see patient in about 1 hour. COVID test has been ordered. Family updated on plan and in agreement.   This note was prepared using Dragon voice recognition software and may include unintentional dictation errors due to the inherent limitations of voice  recognition software.   Final Clinical Impression(s) / ED Diagnoses Final diagnoses:  Kidney stone on right side  Pyelonephritis    Rx / DC Orders ED Discharge Orders    None       Tanda Rockers, PA-C 10/06/20 1536    Mancel Bale, MD 10/06/20 2048

## 2020-10-06 NOTE — H&P (Signed)
Ann Thompson is an 80 y.o. female.    Chief Complaint: RIGHT Ureteral / Renal Stones, Pyelonephritis  HPI:   1 - RIGHT Ureteral / Renal Stones - 6mm Rt prox ureteral stone with moderatehydro + scattered Rt renal stones (total volume about 1.2cm), no left sided stones by ER CT 10/06/20 on eval flank / abdominal pain. Cr 0.89.   2 - Pyelonephritis - fever to 102, malaise, WBC 13k, and right perinephric stranding c/w early pyelo with obstruction. Lactate 1.5. UCX 1/29 pending. Rocephin given in ER.   PMH sig for DM2 (A1c 7s), L spine surgery (still with significant lumbago, LE pain), hysterectomy. [place] independantly [place] with her husband in townhome. Daughter lives close and is very involved.  Her PCP os Ann Lope NP.  Today "Ann Thompson" is seen for urgent evaluation of obstructing stone in setting of pyelonephritis. Few sips coffee 9 AM. C19 screen negative.   Past Medical History:  Diagnosis Date  . Arthritis   . Diabetes mellitus (HCC)   . Hypertension     Past Surgical History:  Procedure Laterality Date  . ABDOMINAL HYSTERECTOMY  1982  . APPENDECTOMY  1957  . left knee surgery       Family History  Family history unknown: Yes   Social History:  reports that she has never smoked. She has never used smokeless tobacco. She reports that she does not drink alcohol and does not use drugs.  Allergies:  Allergies  Allergen Reactions  . Diclofenac Swelling    1Suspected to have caused hives 2015-- had allergy testing 1Suspected to have caused hives 2015-- had allergy testing                          Tongue swelling  . Metformin     (Not in a hospital admission)   Results for orders placed or performed during the hospital encounter of 10/06/20 (from the past 48 hour(s))  Lipase, blood     Status: None   Collection Time: 10/06/20 10:15 AM  Result Value Ref Range   Lipase 26 11 - 51 U/L    Comment: Performed at [facility], [address] [place], [place] [address]  Comprehensive metabolic panel     Status: Abnormal   Collection Time: 10/06/20 10:15 AM  Result Value Ref Range   Sodium 137 135 - 145 mmol/L   Potassium 3.9 3.5 - 5.1 mmol/L   Chloride 96 (L) 98 - 111 mmol/L   CO2 28 22 - 32 mmol/L   Glucose, Bld 182 (H) 70 - 99 mg/dL    Comment: Glucose reference range applies only to samples taken after fasting for at least 8 hours.   BUN 17 8 - 23 mg/dL   Creatinine, Ser 8.52 0.44 - 1.00 mg/dL   Calcium 77.8 (H) 8.9 - 10.3 mg/dL   Total Protein 7.1 6.5 - 8.1 g/dL   Albumin 3.9 3.5 - 5.0 g/dL   AST 20 15 - 41 U/L   ALT 22 0 - 44 U/L   Alkaline Phosphatase 73 38 - 126 U/L   Total Bilirubin 0.8 0.3 - 1.2 mg/dL   GFR, Estimated >24 >23 mL/min    Comment: (NOTE) Calculated using the CKD-EPI Creatinine Equation (2021)    Anion gap 13 5 - 15    Comment: Performed at [facility], [address] [place], [place] [address]  CBC     Status: Abnormal  Collection Time: 10/06/20 10:15 AM  Result Value Ref Range   WBC 13.0 (H) 4.0 - 10.5 K/uL   RBC 4.33 3.87 - 5.11 MIL/uL   Hemoglobin 14.3 12.0 - 15.0 g/dL   HCT 02.6 37.8 - 58.8 %   MCV 99.5 80.0 - 100.0 fL   MCH 33.0 26.0 - 34.0 pg   MCHC 33.2 30.0 - 36.0 g/dL   RDW 50.2 77.4 - 12.8 %   Platelets 229 150 - 400 K/uL   nRBC 0.0 0.0 - 0.2 %    Comment: Performed at [facility], [address] [place], [place] [address]  Urinalysis, Routine w reflex microscopic Urine, Clean Catch     Status: Abnormal   Collection Time: 10/06/20 11:25 AM  Result Value Ref Range   Color, Urine YELLOW YELLOW   APPearance HAZY (A) CLEAR   Specific Gravity, Urine 1.013 1.005 - 1.030   pH 7.0 5.0 - 8.0   Glucose, UA NEGATIVE NEGATIVE mg/dL   Hgb urine dipstick SMALL (A) NEGATIVE   Bilirubin Urine NEGATIVE NEGATIVE   Ketones, ur 5 (A) NEGATIVE mg/dL   Protein, ur 720 (A) NEGATIVE mg/dL   Nitrite NEGATIVE NEGATIVE   Leukocytes,Ua LARGE (A) NEGATIVE   RBC / HPF 21-50 0 - 5  RBC/hpf   WBC, UA >50 (H) 0 - 5 WBC/hpf   Bacteria, UA RARE (A) NONE SEEN   Squamous Epithelial / LPF 0-5 0 - 5   Mucus PRESENT     Comment: Performed at [facility], [address] [place], [place] [address]  Lactic acid, plasma     Status: None   Collection Time: 10/06/20  1:36 PM  Result Value Ref Range   Lactic Acid, Venous 1.5 0.5 - 1.9 mmol/L    Comment: Performed at [facility], [address] [place], [place] [address]   CT Abdomen Pelvis W Contrast  Result Date: 10/06/2020 CLINICAL DATA:  RIGHT-side abdominal and flank pain, sick since yesterday, dysuria, question pyelonephritis EXAM: CT ABDOMEN AND PELVIS WITH CONTRAST TECHNIQUE: Multidetector CT imaging of the abdomen and pelvis was performed using the standard protocol following bolus administration of intravenous contrast. CONTRAST:  OMNIPAQUE IOHEXOL 300 MG/ML SOLN IV. No oral contrast. COMPARISON:  07/06/2020 FINDINGS: Lower chest: Minimal LEFT basilar atelectasis Hepatobiliary: Minimal fatty infiltration of liver. Gallbladder unremarkable. No biliary dilatation. Pancreas: Normal appearance Spleen: Normal appearance Adrenals/Urinary Tract: Adrenal glands normal appearance. Small cyst at inferior pole LEFT kidney without additional LEFT renal mass hydronephrosis. Cyst at upper pole RIGHT kidney, mildly complicated by a partial septation versus mural nodularity and a few calcifications, measures 2.5 x 1.9 by 2.6 cm. Additional small calculi within RIGHT kidney up to 7 mm diameter image 35. Small cyst at inferior pole RIGHT kidney 13 mm diameter. Delayed RIGHT nephrogram with RIGHT perinephric edema and a poorly defined area of abnormal nephrogram at the lateral aspect of the mid inferior RIGHT kidney suspicious for pyelonephritis. Mild RIGHT hydronephrosis secondary to a 4 mm RIGHT UPJ calculus image 44. Minimal enhancement of the renal pelvic wall could reflect infection. Delayed images show filling defect within  contrast at the renal pelvis, question blood, debris, papillary necrosis, less likely tumor. RIGHT ureter dilated to the urinary bladder. Tiny bladder calculus. Bladder and LEFT ureter otherwise unremarkable. Stomach/Bowel: Stomach and bowel loops normal appearance. Appendix surgically absent by history. Vascular/Lymphatic: Small pelvic phlebolith posterior to bladder. Atherosclerotic calcifications aorta and iliac arteries without aneurysm. No adenopathy. Reproductive: Uterus  surgically absent.  Atrophic ovaries. Other: Umbilical hernia containing fat.  No free air or free fluid. Musculoskeletal: Unremarkable IMPRESSION: Mild RIGHT hydronephrosis and hydroureter secondary to a 4 mm RIGHT UPJ calculus, associated with delay in RIGHT nephrogram. Poorly defined area of abnormal nephrogram at the lateral aspect of the mid inferior RIGHT kidney question pyelonephritis. Filling defect within contrast at the RIGHT renal pelvis, question blood, debris, papillary necrosis, less likely tumor Additional nonobstructing RIGHT renal calculi and small BILATERAL renal cysts. Recommend correlation with urinalysis. Tiny bladder calculus. Umbilical hernia containing fat. Aortic Atherosclerosis (ICD10-I70.0). Electronically Signed   By: Ulyses Southward M.D.   On: 10/06/2020 14:52    Review of Systems  Constitutional: Positive for chills and fever.  Genitourinary: Positive for flank pain.  All other systems reviewed and are negative.   Blood pressure (!) 173/67, pulse 93, temperature (!) 102 F (38.9 C), temperature source Oral, resp. rate (!) 22, height 5' (1.524 m), weight 77.1 kg, SpO2 91 %. Physical Exam Vitals reviewed.  Constitutional:      Comments: Daughter at bedside.   HENT:     Head: Normocephalic.  Eyes:     Pupils: Pupils are equal, round, and reactive to light.  Cardiovascular:     Rate and Rhythm: Normal rate.     Pulses: Normal pulses.  Pulmonary:     Effort: Pulmonary effort is normal.  Abdominal:      General: Abdomen is flat.  Genitourinary:    Comments: Mild Rt flank pain at present. Musculoskeletal:        General: Normal range of motion.     Cervical back: Normal range of motion.  Skin:    General: Skin is warm.  Neurological:     General: No focal deficit present.  Psychiatric:        Mood and Affect: Mood normal.      Assessment/Plan   1 - RIGHT Ureteral / Renal Stones - rec urgent renal decompression today with Rt stent, then right ureteroscopy in elective settin in few weeks with goal of stone free. Risks, benefits, alternatives (neph tube), expected peri-op course for stenting discussed, Admit post-op and continue IV ABX until fever free x 24 hours before DC, preferably with prelim CX data.  2 - Pyelonephritis - renal decompression and IV ABX as per above.   Sebastian Ache, MD 10/06/2020, 4:05 PM

## 2020-10-06 NOTE — Brief Op Note (Signed)
10/06/2020  7:18 PM  PATIENT:  Ann Thompson  80 y.o. female  PRE-OPERATIVE DIAGNOSIS:  ureteral stone right + pyelonephritis  POST-OPERATIVE DIAGNOSIS:  ureteral stone right + pyelonephritis  PROCEDURE:  Procedure(s): CYSTOSCOPY WITH RETROGRADE PYELOGRAM/URETERAL STENT PLACEMENT (Right)  SURGEON:  Surgeon(s) and Role:    * Sebastian Ache, MD - Primary  PHYSICIAN ASSISTANT:   ASSISTANTS: none   ANESTHESIA:   general  EBL:  minimal   BLOOD ADMINISTERED:none  DRAINS: none   LOCAL MEDICATIONS USED:  NONE  SPECIMEN:  No Specimen  DISPOSITION OF SPECIMEN:  N/A  COUNTS:  YES  TOURNIQUET:  * No tourniquets in log *  DICTATION: .Other Dictation: Dictation Number [id number]  PLAN OF CARE: Admit to inpatient   PATIENT DISPOSITION:  PACU - hemodynamically stable.   Delay start of Pharmacological VTE agent (>24hrs) due to surgical blood loss or risk of bleeding: yes

## 2020-10-06 NOTE — ED Triage Notes (Signed)
C/o R sided abd pain and flank pain since yesterday with dysuria.

## 2020-10-06 NOTE — Anesthesia Procedure Notes (Signed)
Procedure Name: LMA Insertion Date/Time: 10/06/2020 7:02 PM Performed by: Molli Hazard, CRNA Pre-anesthesia Checklist: Patient identified, Emergency Drugs available, Suction available and Patient being monitored Patient Re-evaluated:Patient Re-evaluated prior to induction Oxygen Delivery Method: Circle system utilized Preoxygenation: Pre-oxygenation with 100% oxygen Induction Type: IV induction LMA: LMA inserted LMA Size: 4.0 Number of attempts: 1 Placement Confirmation: positive ETCO2 Tube secured with: Tape Dental Injury: Teeth and Oropharynx as per pre-operative assessment

## 2020-10-06 NOTE — Transfer of Care (Signed)
Immediate Anesthesia Transfer of Care Note  Patient: Ann Thompson  Procedure(s) Performed: CYSTOSCOPY WITH RETROGRADE PYELOGRAM/URETERAL STENT PLACEMENT (Right Ureter)  Patient Location: PACU  Anesthesia Type:General  Level of Consciousness: awake and alert   Airway & Oxygen Therapy: Patient Spontanous Breathing  Post-op Assessment: Report given to RN and Post -op Vital signs reviewed and stable  Post vital signs: Reviewed  Last Vitals:  Vitals Value Taken Time  BP    Temp    Pulse    Resp    SpO2      Last Pain:  Vitals:   10/06/20 1806  TempSrc: Oral  PainSc: 3          Complications: No complications documented.

## 2020-10-06 NOTE — ED Notes (Signed)
Called pt daughter when in room  Azzie Almas [phone]

## 2020-10-06 NOTE — Progress Notes (Signed)
Ann Dunk, CRNA at bedside.

## 2020-10-06 NOTE — ED Provider Notes (Signed)
  Face-to-face evaluation   History: Patient is here for evaluation of right-sided abdominal pain and flank pain since yesterday and has associated dysuria.  Patient was diagnosed with a right-sided kidney stone, incidentally during work-up for back pain with left leg pain, several months ago.  No prior history of complications or recurrent treatments for kidney stones.  The patient is using leftover hydrocodone, for her pain with partial relief.  She is taking her usual medications.  Has been no fever, chills, shortness of breath, noted weakness or dizziness.  Patient unable to give history because she was sedated by morphine at the time I saw her.  Patient's daughter in the room and gives history.  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  Her neurosurgeon ordered a MRI of the left thigh, to evaluate the lipomas.  He stated to the patient's daughter that he did not think the lipomas were causing her leg pain.  There are no other known modifying factors    Physical exam: Obese female who is alert and interactive.  She does not appear significantly uncomfortable.  Abdomen is soft and nontender to palpation.  Left anterior thigh has minimal regularity of the anterior lower thigh, with a nonspecific nodular consistency.  Medical screening examination/treatment/procedure(s) were conducted as a shared visit with non-physician practitioner(s) and myself.  I personally evaluated the patient during the encounter    Mancel Bale, MD 10/06/20 2048

## 2020-10-06 NOTE — Anesthesia Postprocedure Evaluation (Signed)
Anesthesia Post Note  Patient: Ann Thompson  Procedure(s) Performed: CYSTOSCOPY WITH RETROGRADE PYELOGRAM/URETERAL STENT PLACEMENT (Right Ureter)     Patient location during evaluation: PACU Anesthesia Type: General Level of consciousness: awake Pain management: pain level controlled Vital Signs Assessment: post-procedure vital signs reviewed and stable Respiratory status: spontaneous breathing, nonlabored ventilation, respiratory function stable and patient connected to nasal cannula oxygen Cardiovascular status: blood pressure returned to baseline and stable Postop Assessment: no apparent nausea or vomiting Anesthetic complications: no   No complications documented.  Last Vitals:  Vitals:   10/06/20 2035 10/06/20 2050  BP: (!) 164/67 (!) 153/63  Pulse: (!) 105 (!) 109  Resp: (!) 32 17  Temp: 36.7 C 36.7 C  SpO2: 94% 90%    Last Pain:  Vitals:   10/06/20 2050  TempSrc:   PainSc: 0-No pain                 Tiberius Loftus P Concettina Leth

## 2020-10-07 ENCOUNTER — Encounter (HOSPITAL_COMMUNITY): Payer: Self-pay | Admitting: Urology

## 2020-10-07 LAB — GLUCOSE, CAPILLARY
Glucose-Capillary: 180 mg/dL — ABNORMAL HIGH (ref 70–99)
Glucose-Capillary: 147 mg/dL — ABNORMAL HIGH (ref 70–99)
Glucose-Capillary: 141 mg/dL — ABNORMAL HIGH (ref 70–99)
Glucose-Capillary: 141 mg/dL — ABNORMAL HIGH (ref 70–99)

## 2020-10-07 LAB — CBC
HCT: 38.4 % (ref 36.0–46.0)
Hemoglobin: 13 g/dL (ref 12.0–15.0)
MCH: 33.9 pg (ref 26.0–34.0)
MCHC: 33.9 g/dL (ref 30.0–36.0)
MCV: 100.3 fL — ABNORMAL HIGH (ref 80.0–100.0)
Platelets: 186 10*3/uL (ref 150–400)
RBC: 3.83 MIL/uL — ABNORMAL LOW (ref 3.87–5.11)
RDW: 13.5 % (ref 11.5–15.5)
WBC: 16.1 10*3/uL — ABNORMAL HIGH (ref 4.0–10.5)
nRBC: 0 % (ref 0.0–0.2)

## 2020-10-07 LAB — URINE CULTURE: Culture: <10000 — AB

## 2020-10-07 MED ORDER — ACETAMINOPHEN 500 MG PO TABS
1000.0000 mg | ORAL_TABLET | Freq: Three times a day (TID) | ORAL | Status: DC | PRN
  Administered 2020-10-07 – 2020-10-08 (×2): 1000 mg via ORAL
  Filled 2020-10-07 (×2): qty 2

## 2020-10-07 MED ORDER — PRAMIPEXOLE DIHYDROCHLORIDE 0.125 MG PO TABS
1.0000 mg | ORAL_TABLET | Freq: Every day | ORAL | Status: DC
  Administered 2020-10-07: 1 mg via ORAL
  Filled 2020-10-07: qty 8

## 2020-10-07 NOTE — Progress Notes (Signed)
1 Day Post-Op   Subjective/Chief Complaint:   1 - RIGHT Ureteral / Renal Stones - s/p urgent Rt stent (6x24 contour) 10/06/20 for 67mm Rt prox ureteral stone with moderatehydro + scattered Rt renal stones (total volume about 1.2cm), no left sided stones by ER CT 10/06/20 on eval flank / abdominal pain.    2 - Pyelonephritis - s/p Rt stent 1/29 for renal decompression in setting of fevers / early pyelo with obstruction. Lactate 1.5. UCX,BCX 1/29 pending. On Rocephin.  Today "Diego" is stable. Still some fevers as expected. WBC up some, though not drastically, also typical. SBP reasonable as in pulse. Her biggest complaint is her chronic back/leg pain. No more flank pain.     Objective: Vital signs in last 24 hours: Temp:  [97.2 F (36.2 C)-102 F (38.9 C)] 100 F (37.8 C) (01/30 0424) Pulse Rate:  [86-111] 90 (01/30 0424) Resp:  [15-32] 17 (01/30 0424) BP: (112-176)/(59-83) 159/60 (01/30 0424) SpO2:  [87 %-100 %] 92 % (01/30 0424) Weight:  [77.1 kg] 77.1 kg (01/29 1531) Last BM Date: 10/05/20  Intake/Output from previous day: 01/29 0701 - 01/30 0700 In: 1100 [IV Piggyback:1100] Out: 500 [Urine:500] Intake/Output this shift: Total I/O In: 415.1 [I.V.:415.1] Out: -   Physical Exam Vitals reviewed.  Constitutional:      Comments: Daughter at bedside.   HENT:     Head: Normocephalic.  Eyes:     Pupils: Pupils are equal, round, and reactive to light.  Cardiovascular:     Rate and Rhythm: Normal rate.     Pulses: Normal pulses.  Pulmonary:     Effort: Pulmonary effort is normal.  Abdominal:     General: stable truncal obesity.  Genitourinary:    Comments: No CVAT Musculoskeletal:        General: Normal range of motion.     Cervical back: Normal range of motion.  Skin:    General: Skin is warm.  Neurological:     General: No focal deficit present.  Psychiatric:        Mood and Affect: Mood normal.       Lab Results:  Recent Labs    10/06/20 1015  10/07/20 0137  WBC 13.0* 16.1*  HGB 14.3 13.0  HCT 43.1 38.4  PLT 229 186   BMET Recent Labs    10/06/20 1015  NA 137  K 3.9  CL 96*  CO2 28  GLUCOSE 182*  BUN 17  CREATININE 0.89  CALCIUM 10.9*   PT/INR No results for input(s): LABPROT, INR in the last 72 hours. ABG No results for input(s): PHART, HCO3 in the last 72 hours.  Invalid input(s): PCO2, PO2  Studies/Results: CT Abdomen Pelvis W Contrast  Result Date: 10/06/2020 CLINICAL DATA:  RIGHT-side abdominal and flank pain, sick since yesterday, dysuria, question pyelonephritis EXAM: CT ABDOMEN AND PELVIS WITH CONTRAST TECHNIQUE: Multidetector CT imaging of the abdomen and pelvis was performed using the standard protocol following bolus administration of intravenous contrast. CONTRAST:  OMNIPAQUE IOHEXOL 300 MG/ML SOLN IV. No oral contrast. COMPARISON:  07/06/2020 FINDINGS: Lower chest: Minimal LEFT basilar atelectasis Hepatobiliary: Minimal fatty infiltration of liver. Gallbladder unremarkable. No biliary dilatation. Pancreas: Normal appearance Spleen: Normal appearance Adrenals/Urinary Tract: Adrenal glands normal appearance. Small cyst at inferior pole LEFT kidney without additional LEFT renal mass hydronephrosis. Cyst at upper pole RIGHT kidney, mildly complicated by a partial septation versus mural nodularity and a few calcifications, measures 2.5 x 1.9 by 2.6 cm. Additional small calculi within RIGHT kidney  up to 7 mm diameter image 35. Small cyst at inferior pole RIGHT kidney 13 mm diameter. Delayed RIGHT nephrogram with RIGHT perinephric edema and a poorly defined area of abnormal nephrogram at the lateral aspect of the mid inferior RIGHT kidney suspicious for pyelonephritis. Mild RIGHT hydronephrosis secondary to a 4 mm RIGHT UPJ calculus image 44. Minimal enhancement of the renal pelvic wall could reflect infection. Delayed images show filling defect within contrast at the renal pelvis, question blood, debris,  papillary necrosis, less likely tumor. RIGHT ureter dilated to the urinary bladder. Tiny bladder calculus. Bladder and LEFT ureter otherwise unremarkable. Stomach/Bowel: Stomach and bowel loops normal appearance. Appendix surgically absent by history. Vascular/Lymphatic: Small pelvic phlebolith posterior to bladder. Atherosclerotic calcifications aorta and iliac arteries without aneurysm. No adenopathy. Reproductive: Uterus surgically absent.  Atrophic ovaries. Other: Umbilical hernia containing fat.  No free air or free fluid. Musculoskeletal: Unremarkable IMPRESSION: Mild RIGHT hydronephrosis and hydroureter secondary to a 4 mm RIGHT UPJ calculus, associated with delay in RIGHT nephrogram. Poorly defined area of abnormal nephrogram at the lateral aspect of the mid inferior RIGHT kidney question pyelonephritis. Filling defect within contrast at the RIGHT renal pelvis, question blood, debris, papillary necrosis, less likely tumor Additional nonobstructing RIGHT renal calculi and small BILATERAL renal cysts. Recommend correlation with urinalysis. Tiny bladder calculus. Umbilical hernia containing fat. Aortic Atherosclerosis (ICD10-I70.0). Electronically Signed   By: Ulyses Southward M.D.   On: 10/06/2020 14:52   DG Retrograde Pyelogram  Result Date: 10/06/2020 CLINICAL DATA:  Right ureteral stone EXAM: RETROGRADE PYELOGRAM COMPARISON:  CT from earlier in the same day. FINDINGS: Contrast is noted within the collecting system and bladder. Ureteral stent is noted in satisfactory position on the right. Previously seen stone is not well appreciated. 22 seconds of fluoroscopy was utilized. IMPRESSION: Right ureteral stent in satisfactory position. Electronically Signed   By: Alcide Clever M.D.   On: 10/06/2020 20:18    Anti-infectives: Anti-infectives (From admission, onward)   Start     Dose/Rate Route Frequency Ordered Stop   10/07/20 1400  cefTRIAXone (ROCEPHIN) 2 g in sodium chloride 0.9 % 100 mL IVPB        2  g 200 mL/hr over 30 Minutes Intravenous Every 24 hours 10/06/20 2123     10/06/20 1200  cefTRIAXone (ROCEPHIN) 2 g in sodium chloride 0.9 % 100 mL IVPB        2 g 200 mL/hr over 30 Minutes Intravenous  Once 10/06/20 1148 10/06/20 1502      Assessment/Plan:  Continue IV rocephin penidng additiona CX data, she is improving clinically. Rec remain in house until no high grade fevers x 24 hours, and prefer initial CX data. Will need Rt ureteroscoyp in elective setting in few weeks. Pt and family updated and agree with plan.   Remain in house. Possible DC a early as tomorrow late afternoon pending CX and fever curve.   Sebastian Ache 10/07/2020

## 2020-10-07 NOTE — Progress Notes (Signed)
   10/07/20 1212  Vitals  Temp (!) 101.5 F (38.6 C)  Temp Source Oral  Patient called c/o feeling feverish.MD paged to be informed.

## 2020-10-07 NOTE — Progress Notes (Signed)
   10/07/20 1212  Assess: MEWS Score  Temp (!) 101.5 F (38.6 C)  Level of Consciousness Alert  O2 Device Room Air  Assess: MEWS Score  MEWS Temp 2  MEWS Systolic 0  MEWS Pulse 0  MEWS RR 0  MEWS LOC 0  MEWS Score 2  MEWS Score Color Yellow  Assess: if the MEWS score is Yellow or Red  Were vital signs taken at a resting state? Yes  Focused Assessment No change from prior assessment  Early Detection of Sepsis Score *See Row Information* High  MEWS guidelines implemented *See Row Information* Yes  Treat  MEWS Interventions Escalated (See documentation below);Administered scheduled meds/treatments  Take Vital Signs  Increase Vital Sign Frequency  Red: Q 1hr X 4 then Q 4hr X 4, if remains red, continue Q 4hrs;Yellow: Q 2hr X 2 then Q 4hr X 2, if remains yellow, continue Q 4hrs  Escalate  MEWS: Escalate Yellow: discuss with charge nurse/RN and consider discussing with provider and RRT  Notify: Charge Nurse/RN  Name of Charge Nurse/RN Notified Noreene Larsson RN  Date Charge Nurse/RN Notified 10/07/20  Time Charge Nurse/RN Notified 1326  Notify: Provider  Provider Name/Title Dr Berneice Heinrich  Date Provider Notified 10/07/20  Time Provider Notified 1220  Notification Type Call  Response See new orders (tylenol prdered)  Date of Provider Response 10/07/20  Time of Provider Response 1220  Document  Patient Outcome Stabilized after interventions  Progress note created (see row info) Yes

## 2020-10-08 ENCOUNTER — Encounter (HOSPITAL_COMMUNITY): Payer: Self-pay | Admitting: Urology

## 2020-10-08 LAB — GLUCOSE, CAPILLARY
Glucose-Capillary: 139 mg/dL — ABNORMAL HIGH (ref 70–99)
Glucose-Capillary: 114 mg/dL — ABNORMAL HIGH (ref 70–99)
Glucose-Capillary: 133 mg/dL — ABNORMAL HIGH (ref 70–99)

## 2020-10-08 LAB — CBC
HCT: 33.6 % — ABNORMAL LOW (ref 36.0–46.0)
Hemoglobin: 11.5 g/dL — ABNORMAL LOW (ref 12.0–15.0)
MCH: 33.7 pg (ref 26.0–34.0)
MCHC: 34.2 g/dL (ref 30.0–36.0)
MCV: 98.5 fL (ref 80.0–100.0)
Platelets: 165 10*3/uL (ref 150–400)
RBC: 3.41 MIL/uL — ABNORMAL LOW (ref 3.87–5.11)
RDW: 13.8 % (ref 11.5–15.5)
WBC: 10.7 10*3/uL — ABNORMAL HIGH (ref 4.0–10.5)
nRBC: 0 % (ref 0.0–0.2)

## 2020-10-08 MED ORDER — CEPHALEXIN 500 MG PO CAPS: 500.0000 mg | ORAL_CAPSULE | Freq: Three times a day (TID) | ORAL | 0 refills | Status: AC

## 2020-10-08 NOTE — Discharge Summary (Signed)
Physician Discharge Summary  Patient ID: Ann Thompson MRN: [id number] DOB/AGE: Feb 22, 1941 80 y.o.  Admit date: 10/06/2020 Discharge date: 10/08/2020  Admission Diagnoses: Rt Ureteral + Renal Stones with Pyelonephritis  Discharge Diagnoses:  Active Problems:   Ureteral stone   Ureteral stone with hydronephrosis   Discharged Condition: fair  Hospital Course:   1 - RIGHT Ureteral / Renal Stones- s/p urgent Rt stent (6x24 contour) 10/06/20 for 19mm Rt prox ureteral stone with moderatehydro + scattered Rt renal stones (total volume about 1.2cm), no left sided stones by ER CT 10/06/20 on eval flank / abdominal pain.    2 - Pyelonephritis- s/p Rt stent 1/29 for renal decompression in setting of fevers / early pyelo with obstruction. Lactate 1.5. UCX,BCX 1/29 pending scant / minimal growth at discharge. Improved on empiric Rocephin. Fever free x 24 hours at discharge   By the late afternoon of 1/31, she is afebriel x 24 hours, ambulatory per baseline, tollerating PO intake, and felt to be adequate for discharge. She will be schedluded for right ureteroscopy in the elective setting.    Consults: None  Significant Diagnostic Studies: labs: as per above  Treatments: surgery: as per above and IV ABX  Discharge Exam: Blood pressure (!) 123/49, pulse 79, temperature 97.7 F (36.5 C), resp. rate 20, height 5' (1.524 m), weight 77.1 kg, SpO2 98 %.  Vitalsreviewed.  Constitutional:  Comments: Son at bedside. HENT:  Head: Normocephalic.  Eyes:  Pupils: Pupils are equal, round, and reactive to light.  Cardiovascular:  Rate and Rhythm: Normal rate.  Pulses: Normal pulses.  Pulmonary:  Effort: Pulmonary effort is normal.  Abdominal:  General: stable truncal obesity.  Genitourinary: Comments: No CVAT Musculoskeletal:  General: Normal range of motion.  Cervical back: Normal range of motion.  Skin: General: Skin is warm.  Neurological:   General: No focal deficitpresent.  Psychiatric:  Mood and Affect: Moodnormal.   Disposition: HOME      Signed: Sebastian Ache 10/08/2020, 5:58 PM

## 2020-10-08 NOTE — Discharge Instructions (Signed)
1 - You may have urinary urgency (bladder spasms) and bloody urine on / off with stent in place. This is normal. ° °2 - Call MD or go to ER for fever >102, severe pain / nausea / vomiting not relieved by medications, or acute change in medical status ° °

## 2020-10-08 NOTE — Progress Notes (Signed)
Pt and son given d/c summary and instruction. Darin(son) has all belonging.

## 2020-10-08 NOTE — Progress Notes (Signed)
AVS copy given and reviewed with pt and son.

## 2020-10-08 NOTE — Op Note (Signed)
Ann Thompson, BLIZZARD MEDICAL RECORD ZO:[id number] ACCOUNT [id number] DATE OF BIRTH:1941-07-30 FACILITY: [facility] LOCATION: [id number] PHYSICIAN:Fiza Nation, MD  OPERATIVE REPORT  DATE OF PROCEDURE:  10/06/2020  PREOPERATIVE DIAGNOSES:  Right ureteral stone, early pyelonephritis.  PROCEDURES: 1.  Cystoscopy, right retrograde pyelogram and interpretation. 2.  Right ureteral stent placement, 6 x 24 Contour, no tether.  ESTIMATED BLOOD LOSS:  Nil.  COMPLICATIONS:  None.  SPECIMENS:  None.  FINDINGS: 1.  Angulation at the urethra likely consistent with prior sling procedure. 2.  Mild hydronephrosis to filling defect in the proximal ureter with known stone. 3.  Successful placement of right ureteral stent, proximal end in renal pelvis, distal end in urinary bladder.  INDICATIONS:  The patient is an 80 year old woman with history of diabetes and severe lumbago, as well as obesity, who was found on workup of colicky flank pain to have a right proximal ureteral stone.  She developed then acute fevers in the Emergency  Department to 102, had mild leukocytosis.  Urine was mildly impressive for infectious parameters, but her CT imaging revealed significant perinephric stranding on the right side, overall picture concerning for early pyelonephritis obstructing type from  stone.  Options were discussed for management including recommended path of right ureteral stent placement today followed by admission for intravenous antibiotics and then definitive stone management in several weeks after she clears infectious  parameters, and she wished to proceed with stenting today.  Informed consent was obtained and placed in medical record.  DESCRIPTION OF PROCEDURE:  The patient being [name] verified, the procedure being right ureteral stent placement was confirmed.  Procedure timeout was performed.  Intravenous antibiotics were administered.  General LMA anesthesia was induced.  The  patient was placed  into a low lithotomy position.  Sterile field was created, prepped and draped the patient's vagina, introitus, and proximal thighs using iodine.  The patient was noted to have a moderate cystocele.  Cystourethroscopy was performed  using 21-French rigid cystoscope with 30-degree lens.  Inspection of bladder revealed no diverticula, calcifications, or papillary lesions.  The urethra and trigone were somewhat elevated and tethered consistent with suspected prior sling procedure.  The  right ureteral orifice was cannulated with a 5-French renal catheter and right retrograde pyelogram was obtained.  Right retrograde pyelogram demonstrated single right ureter, single system right kidney.  There was a filling defect in proximal ureter consistent with known stone.  There was moderate hydronephrosis above this.  A 0.03 ZIPwire was advanced to lower  pole, over which a new 6 x 24 Contour type stent was placed using fluoroscopic guidance.  Good proximal and distal planes were noted.  Bladder was entered per cystoscope.  Procedure was terminated.  It was not felt that catheterization would be warranted  as she is not hemodynamically unstable.  The patient was taken to postanesthesia care unit in stable condition with plan for inpatient admission, intravenous antibiotics.  IN/NUANCE  D:10/06/2020 T:10/07/2020 JOB:[id number]

## 2020-10-11 ENCOUNTER — Encounter: Payer: Self-pay | Admitting: Adult Health

## 2020-10-11 ENCOUNTER — Ambulatory Visit (INDEPENDENT_AMBULATORY_CARE_PROVIDER_SITE_OTHER): Payer: Medicare Other | Admitting: Adult Health

## 2020-10-11 ENCOUNTER — Other Ambulatory Visit: Payer: Self-pay

## 2020-10-11 VITALS — BP 160/90 | Temp 98.3°F | Wt 172.0 lb

## 2020-10-11 DIAGNOSIS — H9201 Otalgia, right ear: Secondary | ICD-10-CM

## 2020-10-11 LAB — CULTURE, BLOOD (ROUTINE X 2)
Culture: NO GROWTH
Culture: NO GROWTH
Special Requests: ADEQUATE
Special Requests: ADEQUATE

## 2020-10-11 NOTE — Progress Notes (Signed)
Subjective:    Patient ID: Jarissa Sheriff, female    DOB: May 15, 1941, 80 y.o.   MRN: [id number]  HPI  80 year old female who  has a past medical history of Arthritis, Diabetes mellitus (HCC), History of kidney stones, and Hypertension.  She presents to the office today for right ear pain. She has had pain in her right ear for 3 years. She has been seen by ENT in the past and no cause of her ear discomfort was found.  Today she is wondering if her ear is clogged up with wax.  Review of Systems See HPI   Past Medical History:  Diagnosis Date  . Arthritis   . Diabetes mellitus (HCC)   . History of kidney stones   . Hypertension     Social History   Socioeconomic History  . Marital status: Married    Spouse name: Not on file  . Number of children: Not on file  . Years of education: Not on file  . Highest education level: Not on file  Occupational History  . Not on file  Tobacco Use  . Smoking status: Never Smoker  . Smokeless tobacco: Never Used  Vaping Use  . Vaping Use: Never used  Substance and Sexual Activity  . Alcohol use: Never  . Drug use: Never  . Sexual activity: Not on file  Other Topics Concern  . Not on file  Social History Narrative  . Not on file   Social Determinants of Health   Financial Resource Strain: Not on file  Food Insecurity: Not on file  Transportation Needs: Not on file  Physical Activity: Not on file  Stress: Not on file  Social Connections: Not on file  Intimate Partner Violence: Not on file    Past Surgical History:  Procedure Laterality Date  . ABDOMINAL HYSTERECTOMY  1982  . APPENDECTOMY  1957  . CYSTOSCOPY W/ URETERAL STENT PLACEMENT Right 10/06/2020   Procedure: CYSTOSCOPY WITH RETROGRADE PYELOGRAM/URETERAL STENT PLACEMENT;  Surgeon: Sebastian Ache, MD;  Location: [facility] OR;  Service: Urology;  Laterality: Right;  . left knee surgery       Family History  Family history unknown: Yes    Allergies  Allergen Reactions  .  Diclofenac Swelling    1Suspected to have caused hives 2015-- had allergy testing 1Suspected to have caused hives 2015-- had allergy testing                          Tongue swelling  . Metformin     Current Outpatient Medications on File Prior to Visit  Medication Sig Dispense Refill  . bisoprolol-hydrochlorothiazide (ZIAC) 2.5-6.25 MG tablet Take 1 tablet by mouth daily. 90 tablet 3  . cephALEXin (KEFLEX) 500 MG capsule Take 1 capsule (500 mg total) by mouth 3 (three) times daily for 9 days. (Take 7 days now, and also begin 2 days before next Urology surgery) 27 capsule 0  . fluticasone (FLONASE) 50 MCG/ACT nasal spray Place 2 sprays into both nostrils daily. 16 g 6  . Glucosamine-Chondroit-Vit C-Mn (GLUCOSAMINE 1500 COMPLEX) CAPS Take 1 capsule by mouth daily.    Marland Kitchen HYDROcodone-acetaminophen (NORCO/VICODIN) 5-325 MG tablet Take 1 tablet by mouth every 3 (three) hours as needed for moderate pain.    Marland Kitchen MAGNESIUM-POTASSIUM PO Take 1 tablet by mouth daily.    . Multiple Vitamin (MULTIVITAMIN) tablet Take 1 tablet by mouth daily.    . pramipexole (MIRAPEX) 1 MG tablet Take 1  mg by mouth at bedtime.    . simvastatin (ZOCOR) 10 MG tablet Take 1 tablet (10 mg total) by mouth at bedtime. (Patient taking differently: Take 10 mg by mouth in the morning.) 90 tablet 3  . sitaGLIPtin (JANUVIA) 25 MG tablet Take 1 tablet (25 mg total) by mouth daily. 90 tablet 0  . Tetrahydrozoline-Zn Sulfate (EYE DROPS A/C OP) Apply 1 drop to eye daily as needed (dry eyes).    . triamterene-hydrochlorothiazide (DYAZIDE) 37.5-25 MG capsule Take 1 capsule by mouth daily.     No current facility-administered medications on file prior to visit.    BP (!) 160/90   Temp 98.3 F (36.8 C)   Wt 172 lb (78 kg)   BMI 33.59 kg/m       Objective:   Physical Exam Vitals and nursing note reviewed.  Constitutional:      Appearance: Normal appearance.  HENT:     Right Ear: Tympanic membrane, ear  canal and external ear normal. There is no impacted cerumen.     Left Ear: Tympanic membrane, ear canal and external ear normal. There is no impacted cerumen.  Neurological:     General: No focal deficit present.     Mental Status: She is alert and oriented to person, place, and time.  Psychiatric:        Mood and Affect: Mood normal.        Behavior: Behavior normal.        Thought Content: Thought content normal.        Judgment: Judgment normal.       Assessment & Plan:  1. Right ear pain -Completely normal right ear.  No fluid behind TM.  No cerumen impaction.  No signs of otitis media.  She does not want to go back and see another ENT at this time  [name], NP

## 2020-10-11 NOTE — Telephone Encounter (Signed)
I have scheduled the pt for an office visit.  Nothing further needed.

## 2020-10-12 ENCOUNTER — Other Ambulatory Visit: Payer: Self-pay | Admitting: Urology

## 2020-10-23 ENCOUNTER — Other Ambulatory Visit (HOSPITAL_COMMUNITY)
Admission: RE | Admit: 2020-10-23 | Discharge: 2020-10-23 | Disposition: A | Discharge status: Home / Self Care | Payer: Medicare Other | Source: Ambulatory Visit | Attending: Urology | Admitting: Urology

## 2020-10-23 ENCOUNTER — Other Ambulatory Visit: Payer: Self-pay

## 2020-10-23 ENCOUNTER — Encounter (HOSPITAL_BASED_OUTPATIENT_CLINIC_OR_DEPARTMENT_OTHER): Payer: Self-pay | Admitting: Urology

## 2020-10-23 DIAGNOSIS — Z01812 Encounter for preprocedural laboratory examination: Secondary | ICD-10-CM | POA: Insufficient documentation

## 2020-10-23 DIAGNOSIS — Z20822 Contact with and (suspected) exposure to covid-19: Secondary | ICD-10-CM | POA: Insufficient documentation

## 2020-10-23 NOTE — Progress Notes (Signed)
Spoke w/ via phone for pre-op interview---pt and spouse adrian per pt request Lab needs dos---- I stat               Lab results------ekg 10-06-2020 COVID test ------10-23-2020 1335 Arrive at -------1120 am 10-26-2020 NPO after MN NO Solid Food.  Clear liquids from MN until---1020 am then npo Medications to take morning of surgery -----flonase, simvastatin Diabetic medication -----none day of surgery Patient Special Instructions -----none Pre-Op special Istructions -----none Patient verbalized understanding of instructions that were given at this phone interview. Patient denies shortness of breath, chest pain, fever, cough at this phone interview.

## 2020-10-24 LAB — SARS CORONAVIRUS 2 (TAT 6-24 HRS): SARS Coronavirus 2: NEGATIVE

## 2020-10-25 ENCOUNTER — Ambulatory Visit
Admission: RE | Admit: 2020-10-25 | Discharge: 2020-10-25 | Disposition: A | Discharge status: Home / Self Care | Payer: Medicare Other | Source: Ambulatory Visit | Attending: Neurological Surgery | Admitting: Neurological Surgery

## 2020-10-25 ENCOUNTER — Other Ambulatory Visit: Payer: Self-pay

## 2020-10-25 DIAGNOSIS — M7989 Other specified soft tissue disorders: Secondary | ICD-10-CM

## 2020-10-25 DIAGNOSIS — M5416 Radiculopathy, lumbar region: Secondary | ICD-10-CM

## 2020-10-25 IMAGING — MR MR LUMBAR SPINE WO/W CM
4 of 7 series · 15 of 48 positions shown · IV contrast (multihance)
Comparison: CT abdomen/pelvis [DATE]. Lumbar spine MRI
[DATE].

CLINICAL DATA: Lumbar radiculopathy. Additional history provided by
scanning technologist: Patient reports low back pain with left leg
pain/weakness/numbness since [DATE]. History of lumbar surgery.

EXAM:
MRI LUMBAR SPINE WITHOUT AND WITH CONTRAST
TECHNIQUE: Multiplanar and multiecho pulse sequences of the lumbar spine were
obtained without and with intravenous contrast.
CONTRAST:  15mL MULTIHANCE GADOBENATE DIMEGLUMINE 529 MG/ML IV SOLN

[Series 6: T1 · sagittal · 4.0mm · 0.73mm/px · 3 of 15 slices shown (1 of 2)]
[im 1/15]
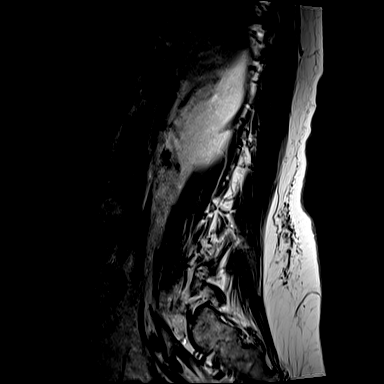
[im 8/15]
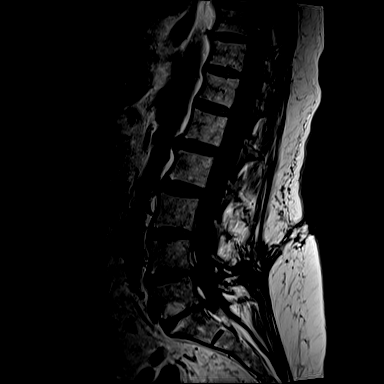
[im 15/15]
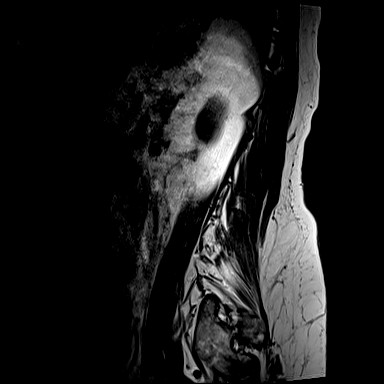

[Series 10: T1 · axial · 4.0mm · 0.28mm/px · z∈[-68,+94]mm · 3 of 39 slices shown (2 of 2)]
[im 4/39]
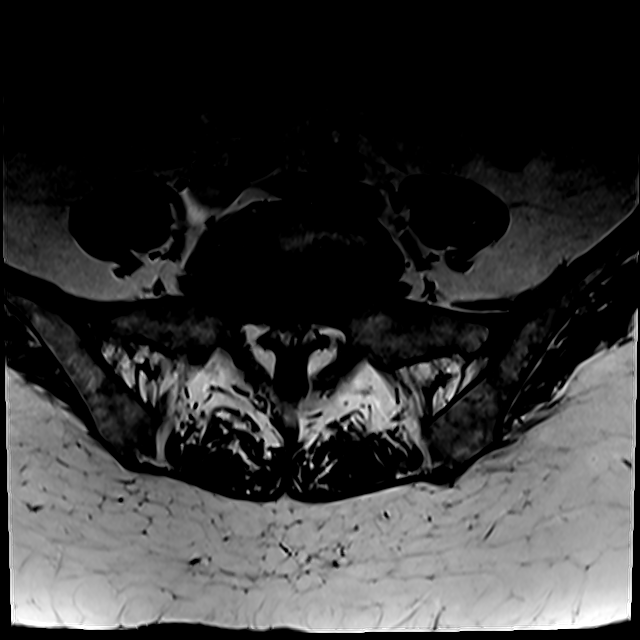
[im 20/39]
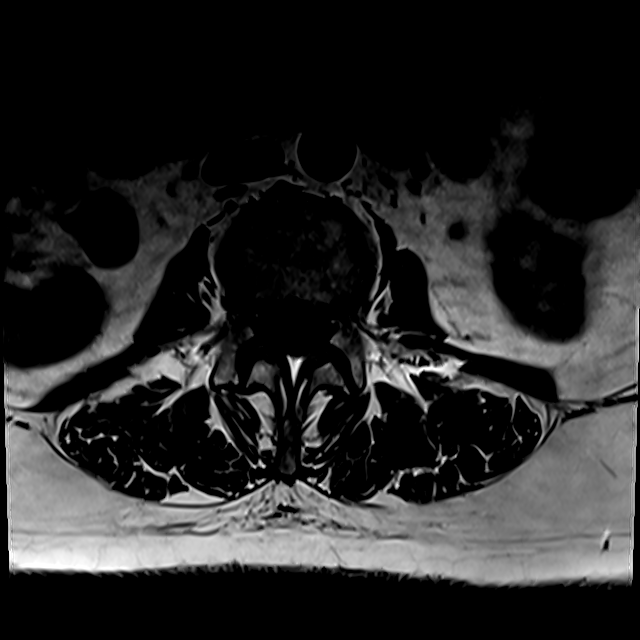
[im 35/39]
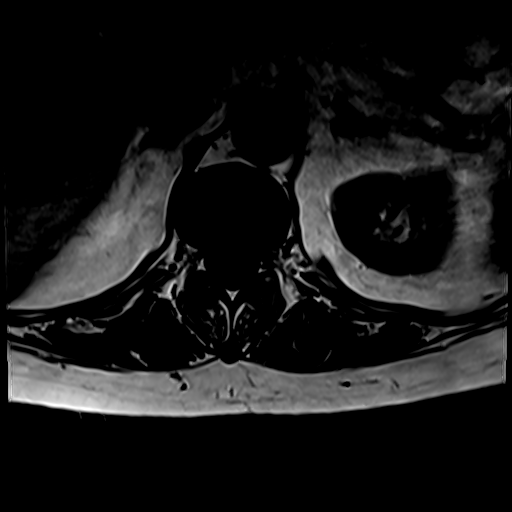

[Series 13: T2 · axial · 4.0mm · 0.28mm/px · z∈[-83,+94]mm · 6 of 39 slices shown (1 of 2)]
[im 1/39]
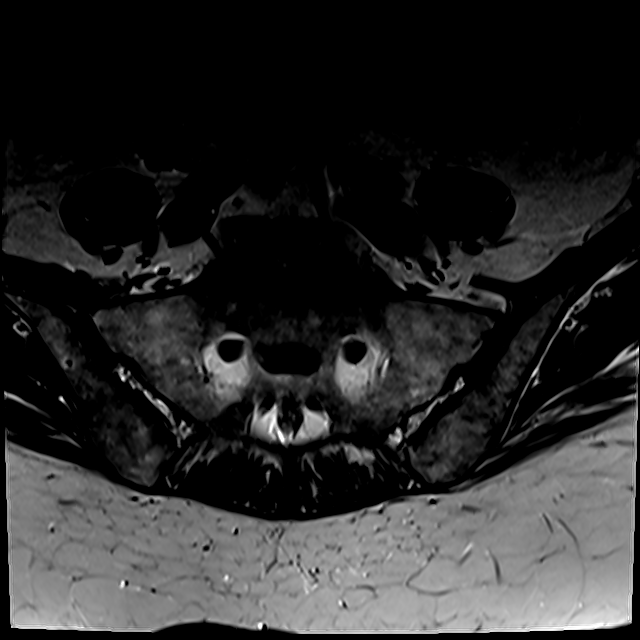
[im 4/39]
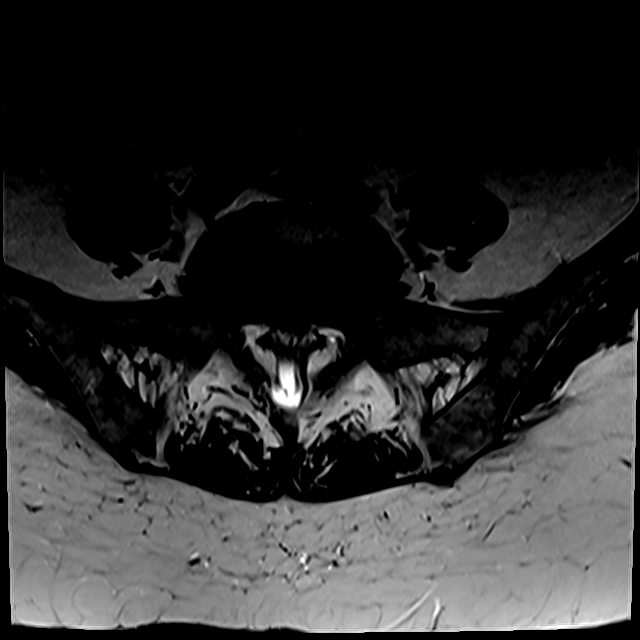
[im 8/39]
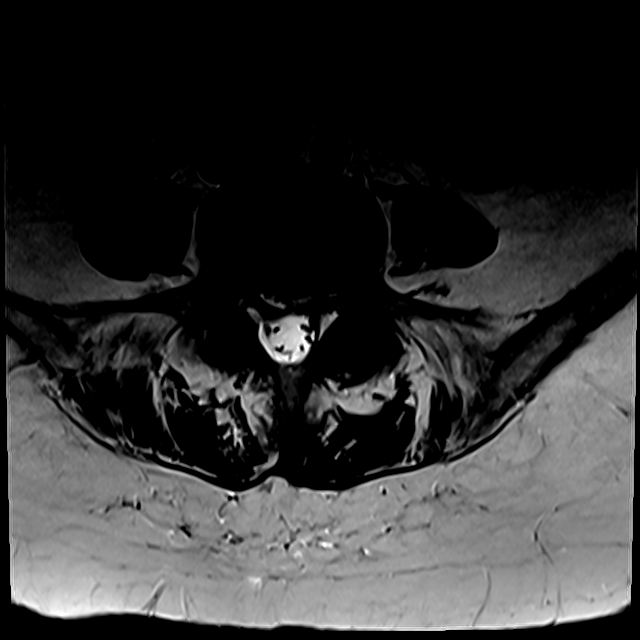
[im 12/39]
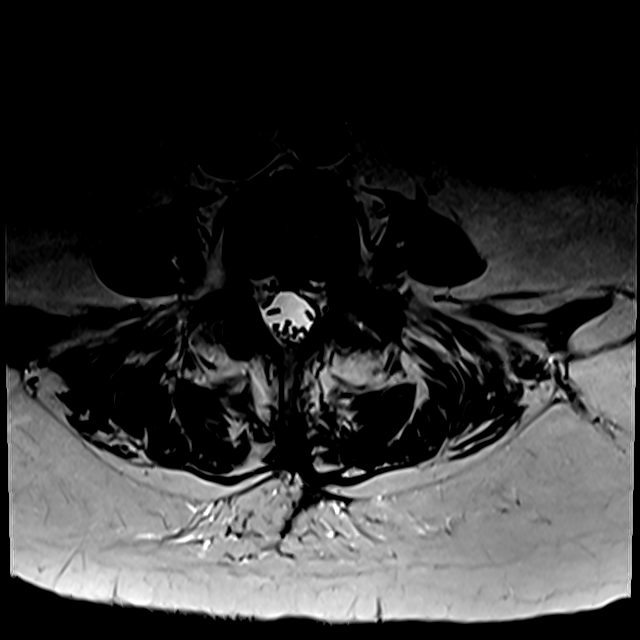
[im 20/39]
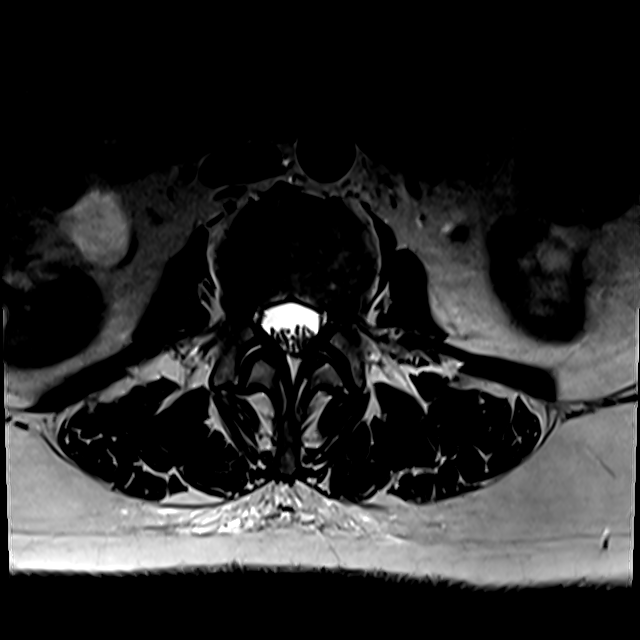
[im 35/39]
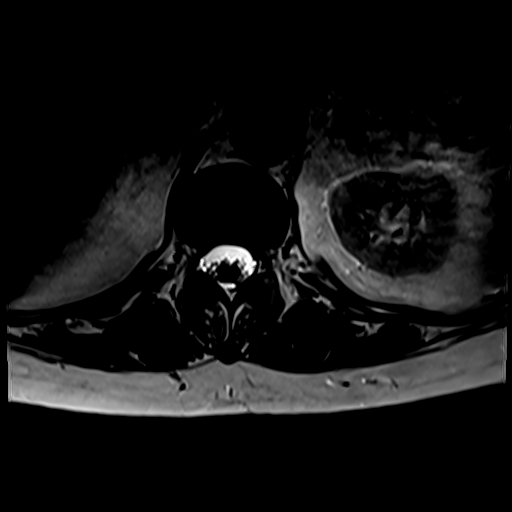

[Series 14: T2 · sagittal · 4.0mm · 0.73mm/px · 3 of 15 slices shown (2 of 2)]
[im 1/15]
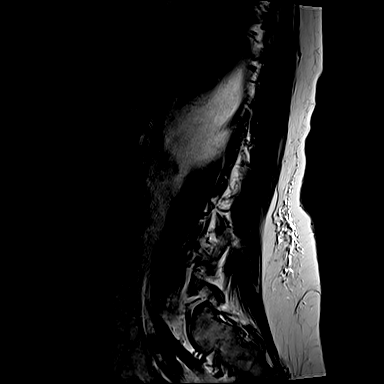
[im 10/15]
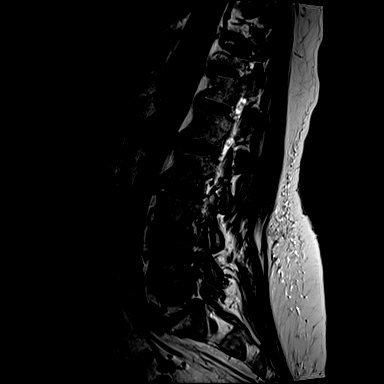
[im 15/15]
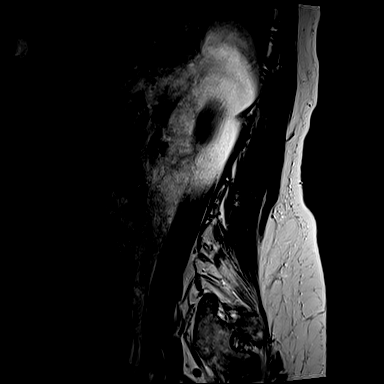

[15 of 48 positions shown; findings below may reference images not displayed]

FINDINGS: Segmentation: 5 lumbar vertebrae. The caudal most well-formed
intervertebral disc space is designated L5-S1.

Alignment: Trace retrolisthesis at L1-L2, L2-L3 and L3-L4,
unchanged.

Vertebrae: Vertebral body height is maintained. Trace degenerative
endplate edema and enhancement at L4-L5. Additionally, there is
edema and enhancement along the left greater than right L4-L5 facet
joints, likely degenerative. Multilevel degenerative endplate
irregularity with small Schmorl nodes. Moderate fatty degenerative
endplate marrow signal at L5-S1.

Conus medullaris and cauda equina: Conus extends to the L1-L2 level.
No signal abnormality within the visualized distal spinal cord.

Paraspinal and other soft tissues: Multiple bilateral cystic renal
lesions more fully characterized on the prior CT abdomen/pelvis of
[DATE]. Redemonstrated right nephrolithiasis. Postsurgical
changes to the lower dorsal paraspinal soft tissues.

Disc levels:

Unless otherwise stated, the level by level findings below have not
significantly changed since prior MRI [DATE].

Mild-to-moderate disc degeneration at L3-L4 and L4-L5. Advanced disc
degeneration at L5-S1. No more than mild disc degeneration at the
remaining levels.

T9-T10: Imaged sagittally. Small central disc protrusion
contributing to no more than mild relative spinal canal narrowing.
The neural foramina are incompletely imaged.

T10-T11: Facet and ligamentum flavum hypertrophy. No significant
disc herniation or stenosis.

T11-T12: Tiny central disc protrusion. No significant spinal canal
or foraminal stenosis.

T12-L1: No significant disc herniation or stenosis.

L1-L2: Trace retrolisthesis. No significant disc herniation or
stenosis.

L2-L3: Trace retrolisthesis. No significant disc herniation or
stenosis.

L3-L4: Trace retrolisthesis. Disc bulge. Superimposed broad-based
right subarticular to right extraforaminal disc extrusion with
slight caudal migration. Mild facet arthrosis. As before, the disc
protrusion contributes to moderate right subarticular stenosis,
encroaching upon the descending right L4 nerve root. Central canal
patent. The disc protrusion also contributes to moderate right
neural foraminal narrowing, unchanged. Mild left neural foraminal
narrowing, unchanged.

L4-L5: Interval left laminectomy and discectomy. Disc bulge with
endplate spurring. Superimposed small residual/recurrent left
subarticular disc extrusion with slight caudal migration. Moderate
facet arthrosis. Although improved, there is persistent left
subarticular narrowing with the disc extrusion contacting and
posteriorly displacing the descending left L5 nerve root (series 13,
image 30). Minimal relative right subarticular and central canal
narrowing. Moderate bilateral neural foraminal narrowing (progressed
on the right).

L5-S1: Disc bulge with endplate spurring. Superimposed central disc
protrusion slightly eccentric to the right. Mild-to-moderate facet
arthrosis. As before, the disc protrusion contributes to mild
bilateral subarticular narrowing without frank nerve root
impingement. Central canal patent. Bilateral neural foraminal
narrowing (moderate/severe right, moderate left).
IMPRESSION: Comparison is made to the prior lumbar spine MRI of [DATE].

At L4-L5, there has been interval left laminectomy and discectomy.
There is a small residual/recurrent left subarticular disc extrusion
with slight caudal migration. Although improved, there is persistent
left subarticular narrowing with the disc extrusion contacting and
posteriorly displacing the descending left L5 nerve root.
Multifactorial moderate bilateral neural foraminal narrowing
(progressed on the right).

Lumbar spondylosis is otherwise unchanged with additional findings
most notably as follows.

At L3-L4, a caudally migrated broad-based right subarticular to
right extraforaminal disc extrusion contributes to moderate right
subarticular stenosis, encroaching upon the descending right L4
nerve root. It also contributes to moderate right neural foraminal
narrowing.

At L5-S1, there is advanced disc degeneration. A small central disc
protrusion contributes to mild bilateral subarticular narrowing
without appreciable nerve root impingement. Multifactorial bilateral
neural foraminal narrowing (moderate/severe right, moderate left).

Right nephrolithiasis.

Bilateral cystic renal lesions more fully characterized on the CT
abdomen/pelvis of [DATE].

## 2020-10-25 IMAGING — MR MR FEMUR*L* W/O CM
4 of 5 series · 13 of 40 positions shown · non-contrast
Comparison: None.

CLINICAL DATA: Left leg mass.

EXAM:
MR OF THE LEFT FEMUR WITHOUT CONTRAST
TECHNIQUE: Multiplanar, multisequence MR imaging of the left lower extremity
was performed. No intravenous contrast was administered.

[Series 3: T1 · axial · left · 5.0mm · 0.34mm/px · z∈[-44,+160]mm · 3 of 48 slices shown (1 of 2)]
[im 7/48]
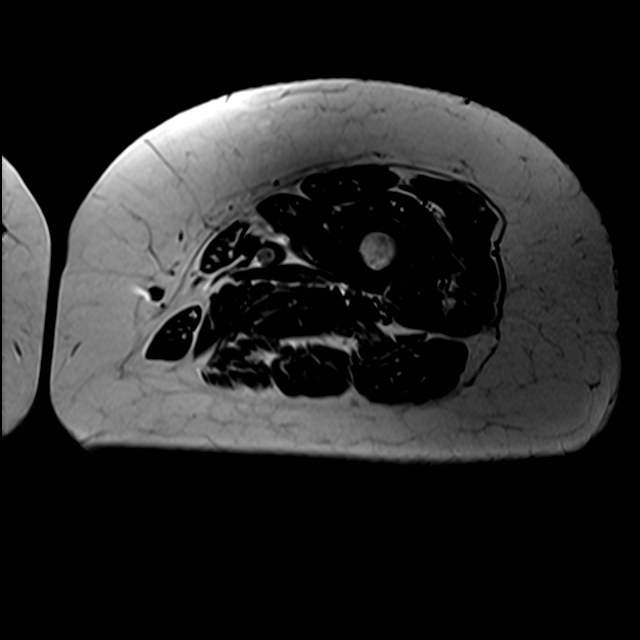
[im 27/48]
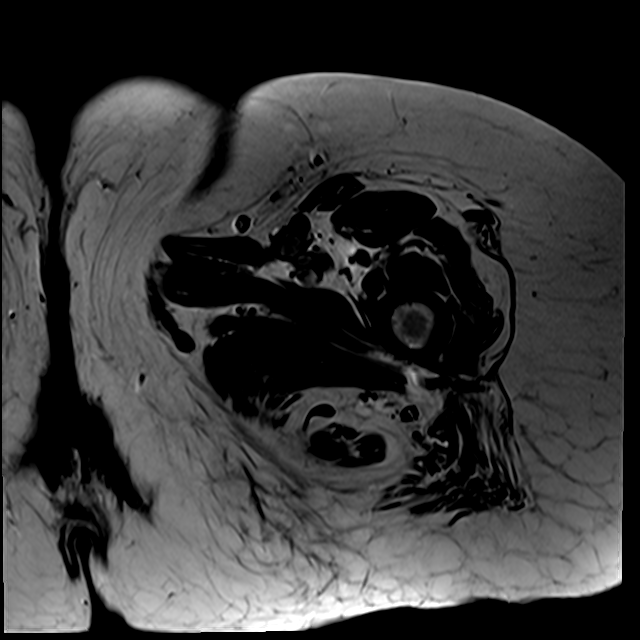
[im 41/48]
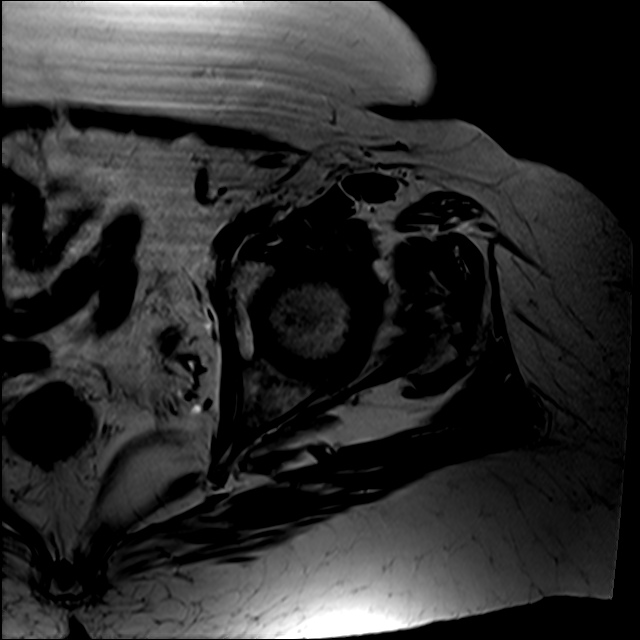

[Series 4: T2 fat-sat · axial · left · 5.0mm · 0.34mm/px · z∈[-80,+154]mm · 4 of 48 slices shown (1 of 2)]
[im 1/48]
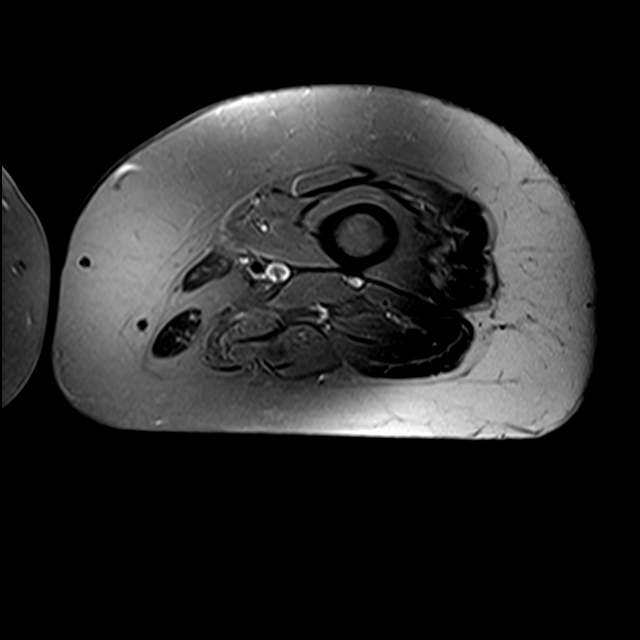
[im 8/48]
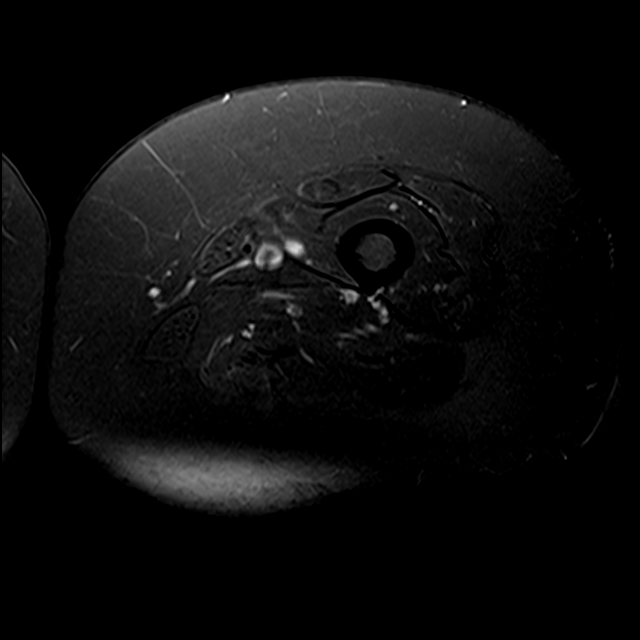
[im 24/48]
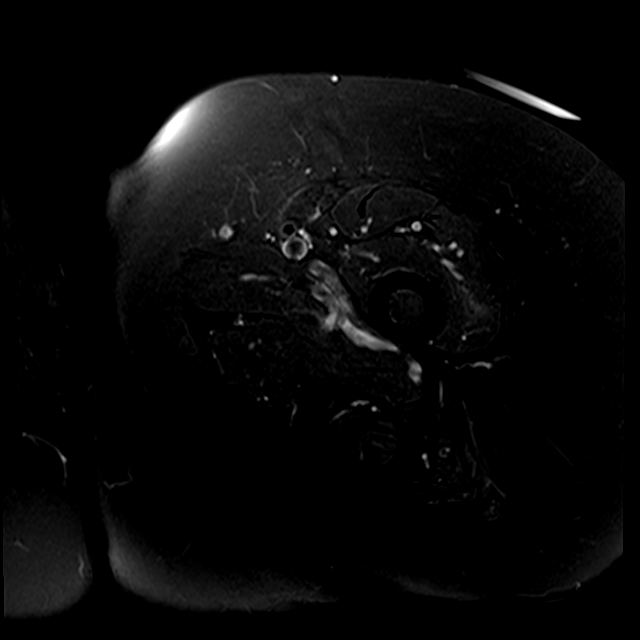
[im 40/48]
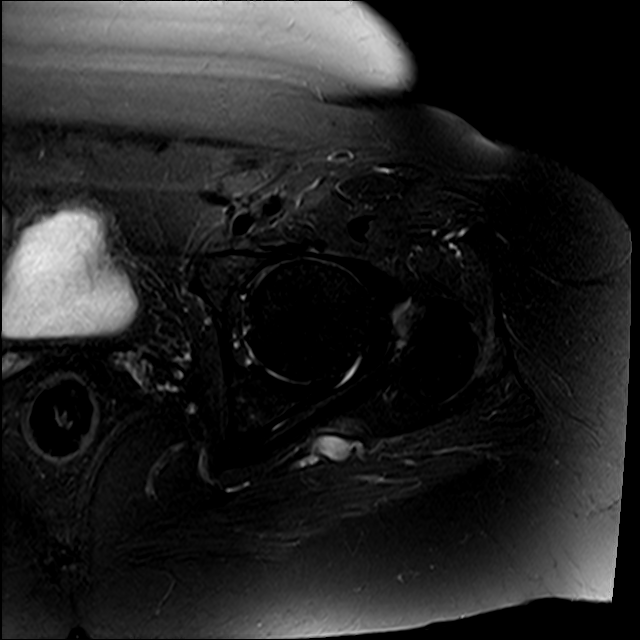

[Series 5: T1 · coronal · left · 3.0mm · 1.30mm/px · 3 of 52 slices shown (2 of 2)]
[im 8/52]
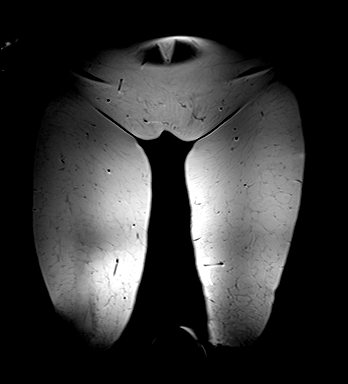
[im 30/52]
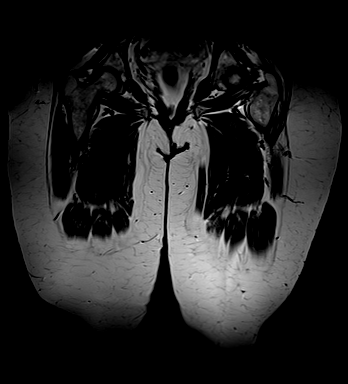
[im 44/52]
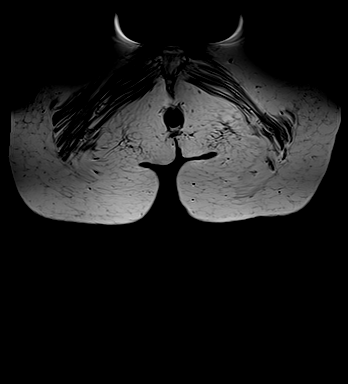

[Series 7: T2 fat-sat · sagittal · left · 3.0mm · 0.45mm/px · 3 of 58 slices shown (2 of 2)]
[im 8/58]
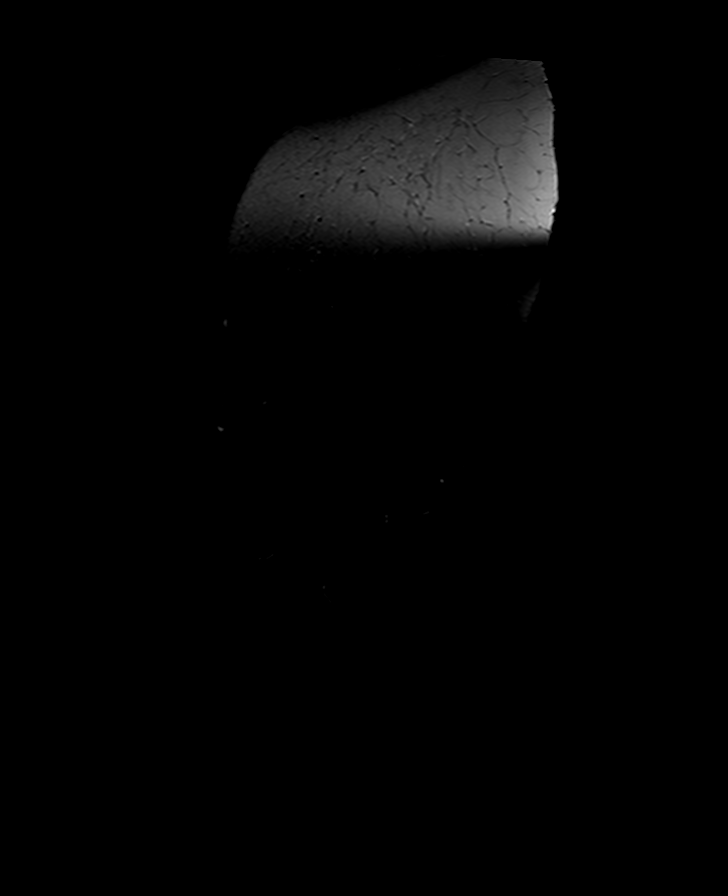
[im 29/58]
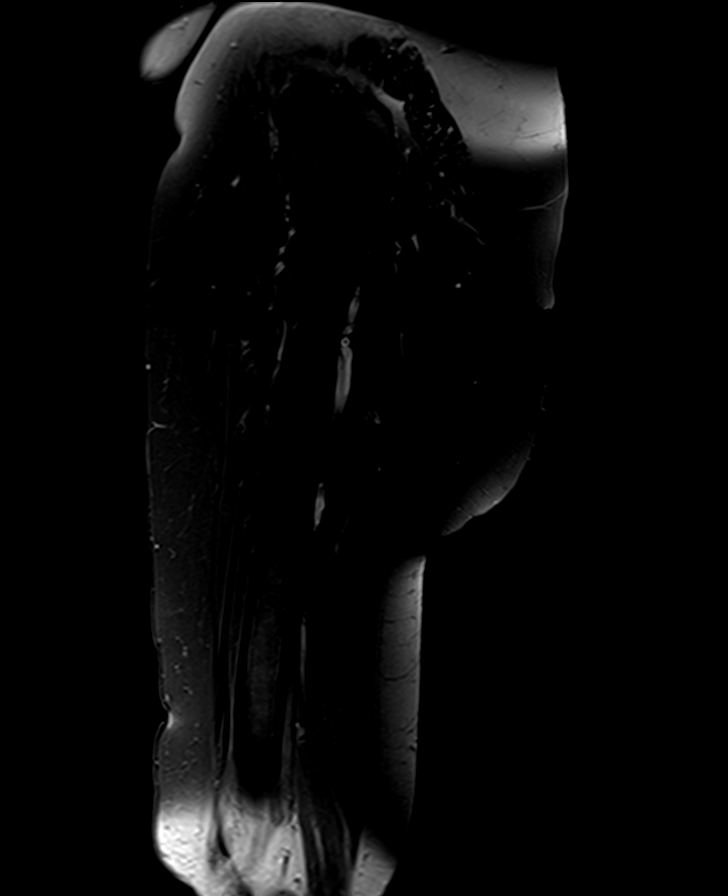
[im 50/58]
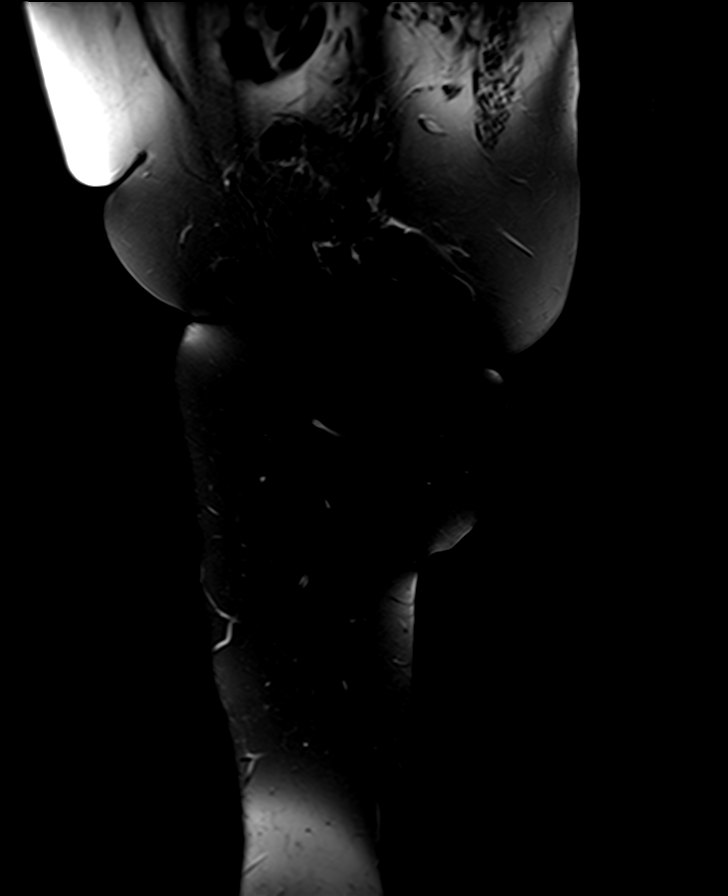

[13 of 40 positions shown; findings below may reference images not displayed]

FINDINGS: The patient's palpable abnormality is marked on the anterior aspect
of the upper thigh. No subcutaneous mass is identified. Fairly
symmetric and homogeneous and quite prominent subcutaneous fat.

The underlying thigh musculature demonstrates age related fatty
atrophy but no muscle mass or hematoma.

Both hips are normally located. Mild age related degenerative
changes but no stress fracture or AVN. No bone lesions are
identified. No marrow edema. The pubic symphysis demonstrates mild
degenerative changes but no lower pelvic bone lesions or stress or
insufficiency fractures are identified. The intrapelvic structures
are grossly normal. No inguinal mass or hernia.
IMPRESSION: 1. No soft tissue mass is identified.
2. No muscle mass or hematoma.
3. Mild age related degenerative changes but no stress fracture or
AVN. No worrisome bone lesions.

## 2020-10-25 IMAGING — MR MR FEMUR*L* W/O CM
4 of 7 series · 19 of 40 positions shown · non-contrast
Comparison: None.

CLINICAL DATA: Left leg mass.

EXAM:
MR OF THE LEFT FEMUR WITHOUT CONTRAST
TECHNIQUE: Multiplanar, multisequence MR imaging of the left lower extremity
was performed. No intravenous contrast was administered.

[Series 6: T1 · sagittal · 4.0mm · 0.73mm/px · 4 of 15 slices shown (1 of 3)]
[im 1/15]
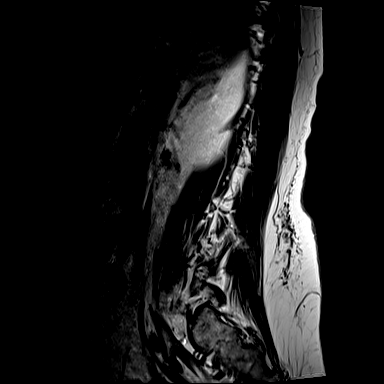
[im 5/15]
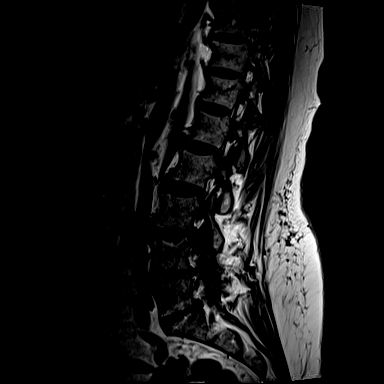
[im 10/15]
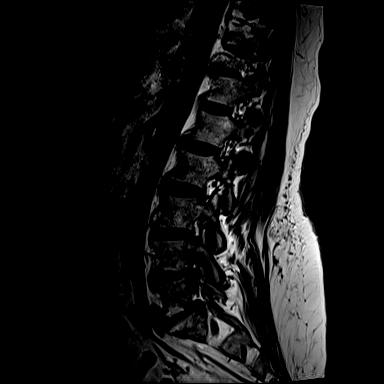
[im 15/15]
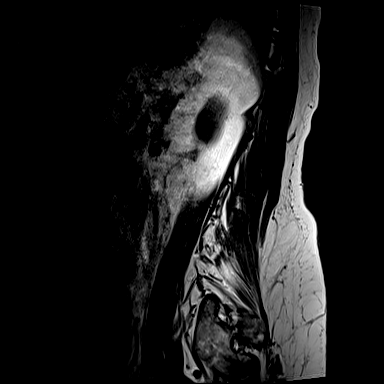

[Series 10: T1 · axial · 4.0mm · 0.28mm/px · z∈[-83,+113]mm · 9 of 39 slices shown (2 of 3)]
[im 1/39]
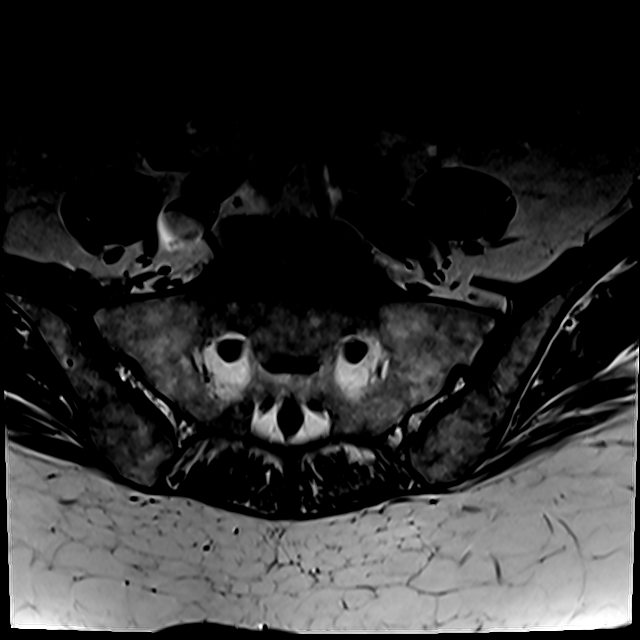
[im 5/39]
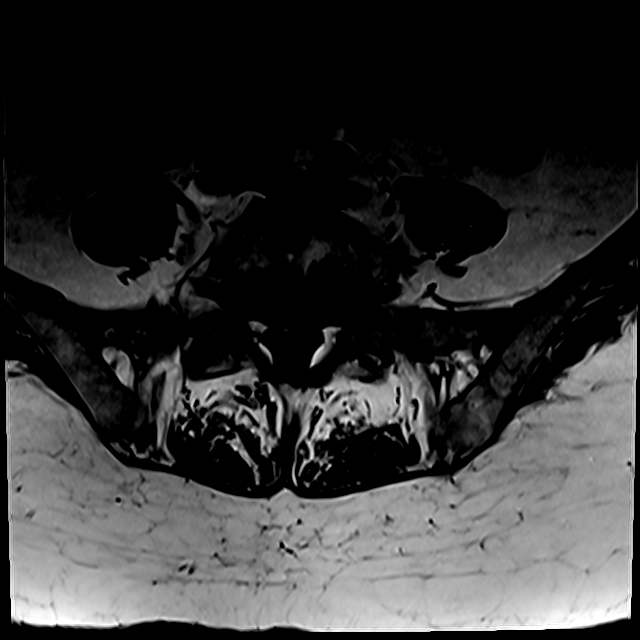
[im 10/39]
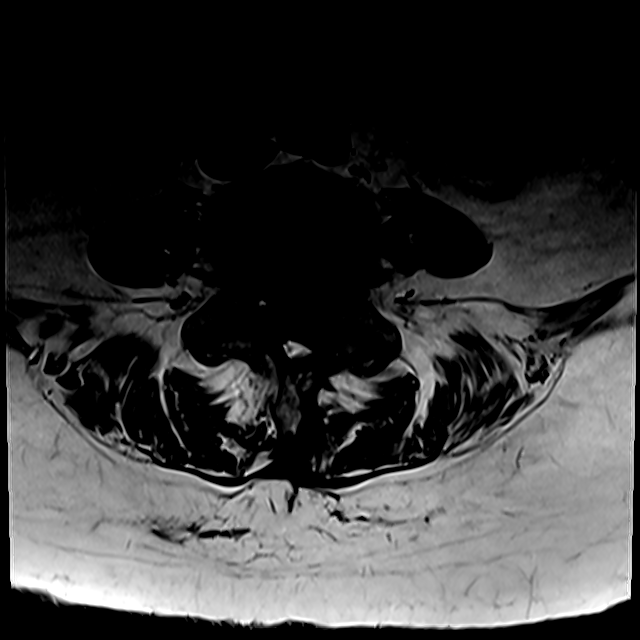
[im 15/39]
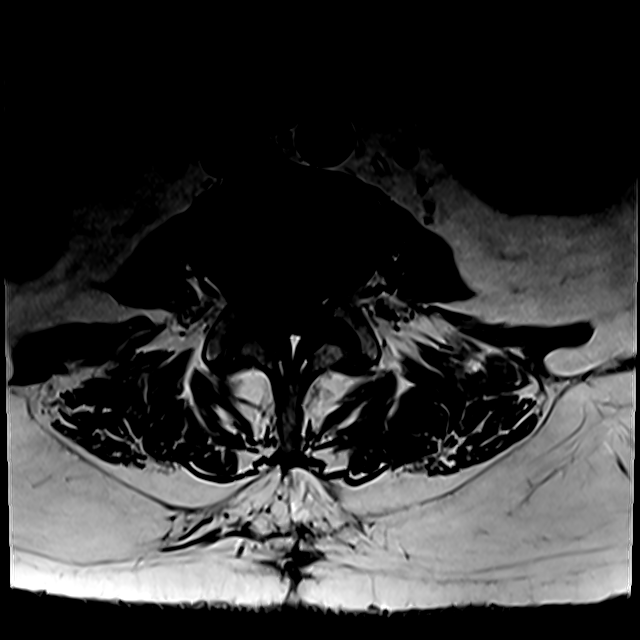
[im 20/39]
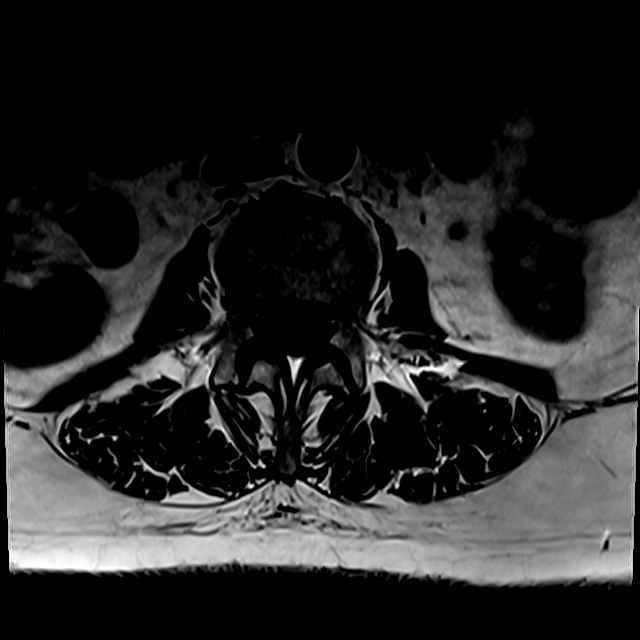
[im 24/39]
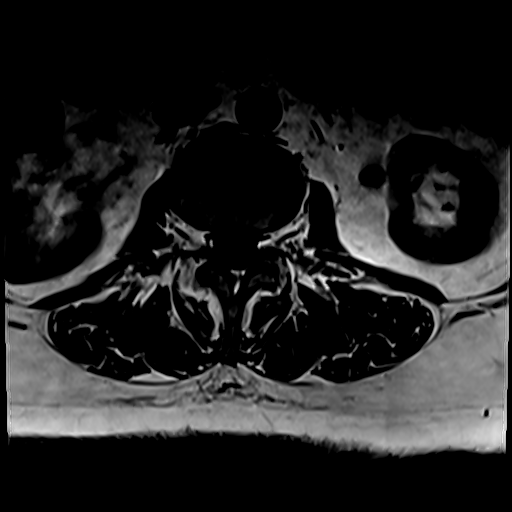
[im 29/39]
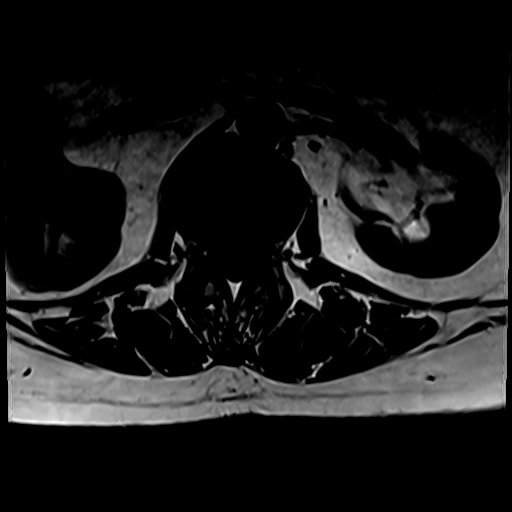
[im 34/39]
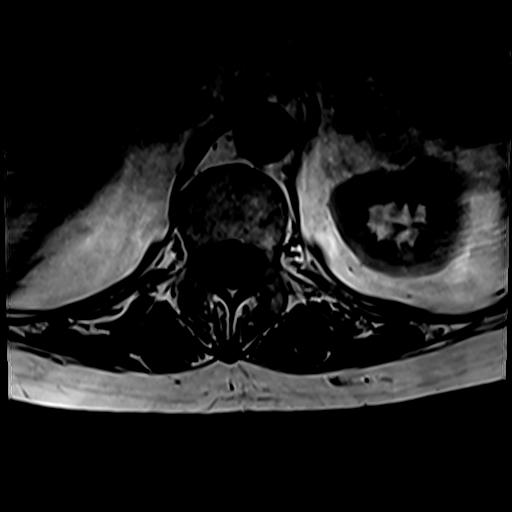
[im 39/39]
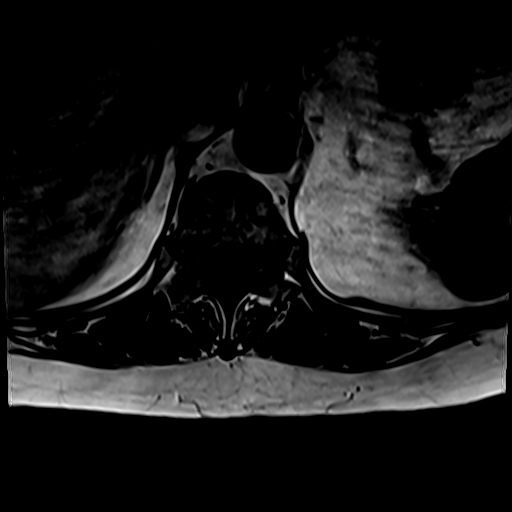

[Series 15: T1 fat-sat post-contrast · sagittal · 4.0mm · 0.73mm/px · 3 of 15 slices shown]
[im 1/15]
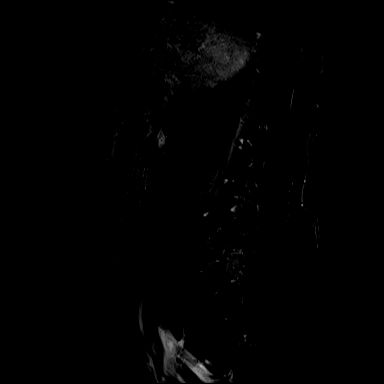
[im 8/15]
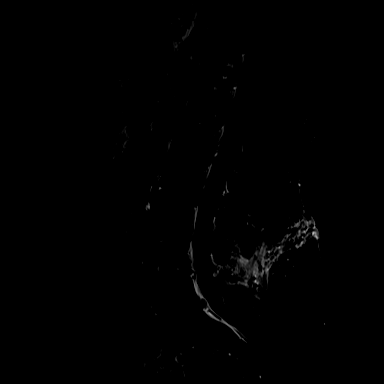
[im 15/15]
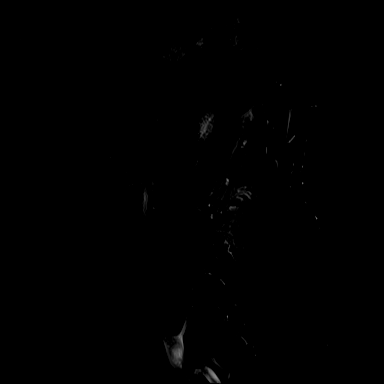

[Series 18: T1 · axial · 4.0mm · 0.28mm/px · z∈[-63,+89]mm · 3 of 39 slices shown (3 of 3)]
[im 5/39]
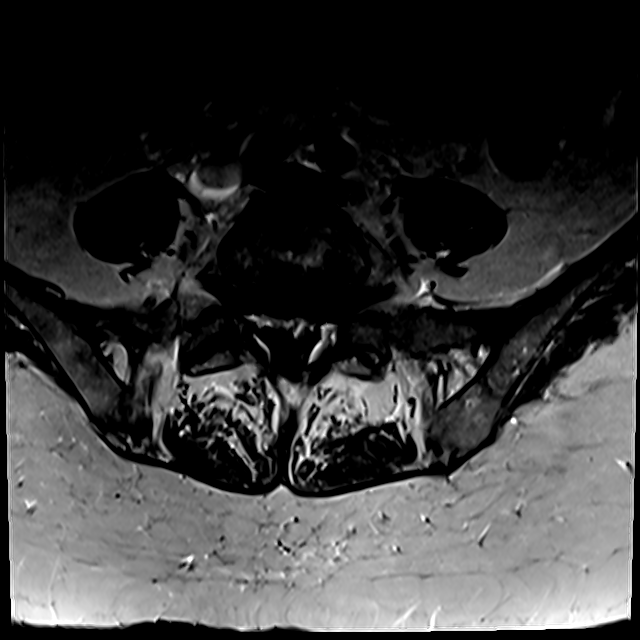
[im 20/39]
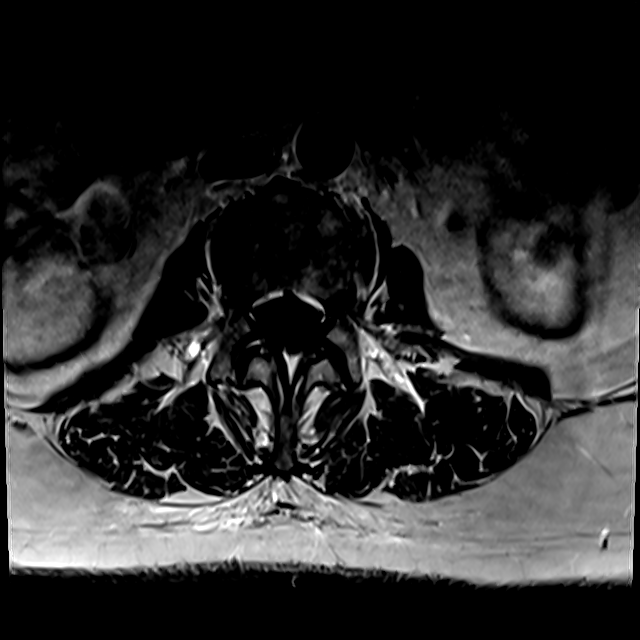
[im 34/39]
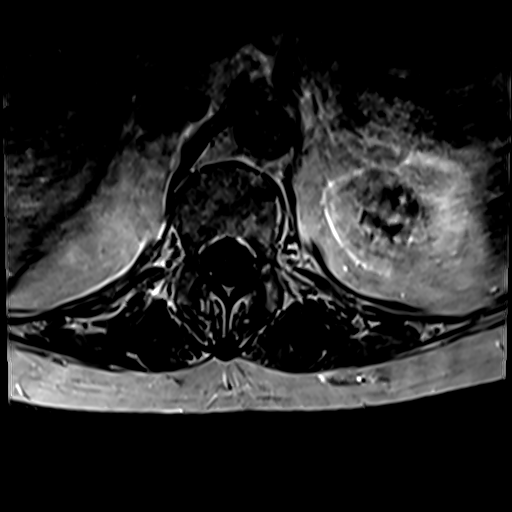

[19 of 40 positions shown; findings below may reference images not displayed]

FINDINGS: The patient's palpable abnormality is marked on the anterior aspect
of the upper thigh. No subcutaneous mass is identified. Fairly
symmetric and homogeneous and quite prominent subcutaneous fat.

The underlying thigh musculature demonstrates age related fatty
atrophy but no muscle mass or hematoma.

Both hips are normally located. Mild age related degenerative
changes but no stress fracture or AVN. No bone lesions are
identified. No marrow edema. The pubic symphysis demonstrates mild
degenerative changes but no lower pelvic bone lesions or stress or
insufficiency fractures are identified. The intrapelvic structures
are grossly normal. No inguinal mass or hernia.
IMPRESSION: 1. No soft tissue mass is identified.
2. No muscle mass or hematoma.
3. Mild age related degenerative changes but no stress fracture or
AVN. No worrisome bone lesions.

## 2020-10-25 MED ORDER — GADOBENATE DIMEGLUMINE 529 MG/ML IV SOLN
15.0000 mL | Freq: Once | INTRAVENOUS | Status: AC | PRN
  Administered 2020-10-25: 15 mL via INTRAVENOUS

## 2020-10-25 NOTE — Progress Notes (Signed)
Called to inform pt of arrival time change to 0930 for surgery tomorrow. Pt did not answer, left voicemail of time and phone number to call if any questions.

## 2020-10-26 ENCOUNTER — Encounter (HOSPITAL_BASED_OUTPATIENT_CLINIC_OR_DEPARTMENT_OTHER)
Admission: RE | Disposition: A | Discharge status: Home / Self Care | Payer: Self-pay | Source: Home / Self Care | Attending: Urology

## 2020-10-26 ENCOUNTER — Ambulatory Visit (HOSPITAL_BASED_OUTPATIENT_CLINIC_OR_DEPARTMENT_OTHER): Payer: Medicare Other | Admitting: Certified Registered"

## 2020-10-26 ENCOUNTER — Ambulatory Visit (HOSPITAL_BASED_OUTPATIENT_CLINIC_OR_DEPARTMENT_OTHER)
Admission: RE | Admit: 2020-10-26 | Discharge: 2020-10-26 | Disposition: A | Discharge status: Home / Self Care | Payer: Medicare Other | Attending: Urology | Admitting: Urology

## 2020-10-26 ENCOUNTER — Encounter (HOSPITAL_BASED_OUTPATIENT_CLINIC_OR_DEPARTMENT_OTHER): Payer: Self-pay | Admitting: Urology

## 2020-10-26 ENCOUNTER — Other Ambulatory Visit: Payer: Self-pay

## 2020-10-26 DIAGNOSIS — E669 Obesity, unspecified: Secondary | ICD-10-CM | POA: Diagnosis not present

## 2020-10-26 DIAGNOSIS — N202 Calculus of kidney with calculus of ureter: Secondary | ICD-10-CM | POA: Insufficient documentation

## 2020-10-26 DIAGNOSIS — Z79899 Other long term (current) drug therapy: Secondary | ICD-10-CM | POA: Diagnosis not present

## 2020-10-26 DIAGNOSIS — Z6832 Body mass index (BMI) 32.0-32.9, adult: Secondary | ICD-10-CM | POA: Diagnosis not present

## 2020-10-26 DIAGNOSIS — Z87891 Personal history of nicotine dependence: Secondary | ICD-10-CM | POA: Insufficient documentation

## 2020-10-26 DIAGNOSIS — Z96653 Presence of artificial knee joint, bilateral: Secondary | ICD-10-CM | POA: Insufficient documentation

## 2020-10-26 DIAGNOSIS — E119 Type 2 diabetes mellitus without complications: Secondary | ICD-10-CM | POA: Diagnosis not present

## 2020-10-26 HISTORY — DX: Type 2 diabetes mellitus without complications: E11.9

## 2020-10-26 HISTORY — DX: Presence of external hearing-aid: Z97.4

## 2020-10-26 HISTORY — PX: CYSTOSCOPY WITH RETROGRADE PYELOGRAM, URETEROSCOPY AND STENT PLACEMENT: SHX5789

## 2020-10-26 HISTORY — DX: Pain in left leg: M79.605

## 2020-10-26 HISTORY — PX: HOLMIUM LASER APPLICATION: SHX5852

## 2020-10-26 HISTORY — DX: Hyperlipidemia, unspecified: E78.5

## 2020-10-26 HISTORY — DX: Gastro-esophageal reflux disease without esophagitis: K21.9

## 2020-10-26 LAB — POCT I-STAT, CHEM 8
BUN: 18 mg/dL (ref 8–23)
Calcium, Ion: 1.46 mmol/L — ABNORMAL HIGH (ref 1.15–1.40)
Chloride: 99 mmol/L (ref 98–111)
Creatinine, Ser: 0.7 mg/dL (ref 0.44–1.00)
Glucose, Bld: 158 mg/dL — ABNORMAL HIGH (ref 70–99)
HCT: 41 % (ref 36.0–46.0)
Hemoglobin: 13.9 g/dL (ref 12.0–15.0)
Potassium: 3.5 mmol/L (ref 3.5–5.1)
Sodium: 138 mmol/L (ref 135–145)
TCO2: 28 mmol/L (ref 22–32)

## 2020-10-26 LAB — GLUCOSE, CAPILLARY: Glucose-Capillary: 149 mg/dL — ABNORMAL HIGH (ref 70–99)

## 2020-10-26 SURGERY — CYSTOURETEROSCOPY, WITH RETROGRADE PYELOGRAM AND STENT INSERTION
Anesthesia: General | Site: Pelvis | Laterality: Right

## 2020-10-26 MED ORDER — KETOROLAC TROMETHAMINE 30 MG/ML IJ SOLN
INTRAMUSCULAR | Status: AC
  Filled 2020-10-26: qty 1

## 2020-10-26 MED ORDER — CEPHALEXIN 500 MG PO CAPS: 500.0000 mg | ORAL_CAPSULE | Freq: Two times a day (BID) | ORAL | 0 refills | Status: AC

## 2020-10-26 MED ORDER — ONDANSETRON HCL 4 MG/2ML IJ SOLN
INTRAMUSCULAR | Status: DC | PRN
  Administered 2020-10-26: 4 mg via INTRAVENOUS

## 2020-10-26 MED ORDER — LIDOCAINE 2% (20 MG/ML) 5 ML SYRINGE
INTRAMUSCULAR | Status: DC | PRN
  Administered 2020-10-26: 60 mg via INTRAVENOUS

## 2020-10-26 MED ORDER — LIDOCAINE HCL (PF) 2 % IJ SOLN
INTRAMUSCULAR | Status: AC
  Filled 2020-10-26: qty 5

## 2020-10-26 MED ORDER — GENTAMICIN SULFATE 40 MG/ML IJ SOLN
280.0000 mg | Freq: Once | INTRAVENOUS | Status: DC
  Filled 2020-10-26: qty 7

## 2020-10-26 MED ORDER — ONDANSETRON HCL 4 MG/2ML IJ SOLN
INTRAMUSCULAR | Status: AC
  Filled 2020-10-26: qty 2

## 2020-10-26 MED ORDER — HYDROCODONE-ACETAMINOPHEN 5-325 MG PO TABS: 1.0000 | ORAL_TABLET | Freq: Four times a day (QID) | ORAL | 0 refills | Status: DC | PRN

## 2020-10-26 MED ORDER — DEXAMETHASONE SODIUM PHOSPHATE 10 MG/ML IJ SOLN
INTRAMUSCULAR | Status: AC
  Filled 2020-10-26: qty 1

## 2020-10-26 MED ORDER — FENTANYL CITRATE (PF) 100 MCG/2ML IJ SOLN
25.0000 ug | INTRAMUSCULAR | Status: DC | PRN
Start: 2020-10-26 — End: 2020-10-26

## 2020-10-26 MED ORDER — LACTATED RINGERS IV SOLN
INTRAVENOUS | Status: DC
  Administered 2020-10-26: 1000 mL via INTRAVENOUS

## 2020-10-26 MED ORDER — AMISULPRIDE (ANTIEMETIC) 5 MG/2ML IV SOLN: 10.0000 mg | Freq: Once | INTRAVENOUS | Status: DC | PRN

## 2020-10-26 MED ORDER — FENTANYL CITRATE (PF) 100 MCG/2ML IJ SOLN
INTRAMUSCULAR | Status: DC | PRN
  Administered 2020-10-26 (×2): 25 ug via INTRAVENOUS

## 2020-10-26 MED ORDER — DEXAMETHASONE SODIUM PHOSPHATE 10 MG/ML IJ SOLN
INTRAMUSCULAR | Status: DC | PRN
  Administered 2020-10-26: 5 mg via INTRAVENOUS

## 2020-10-26 MED ORDER — ARTIFICIAL TEARS OPHTHALMIC OINT
TOPICAL_OINTMENT | OPHTHALMIC | Status: AC
  Filled 2020-10-26: qty 3.5

## 2020-10-26 MED ORDER — IOHEXOL 300 MG/ML  SOLN
INTRAMUSCULAR | Status: DC | PRN
  Administered 2020-10-26: 16 mL via URETHRAL

## 2020-10-26 MED ORDER — PHENYLEPHRINE 40 MCG/ML (10ML) SYRINGE FOR IV PUSH (FOR BLOOD PRESSURE SUPPORT)
PREFILLED_SYRINGE | INTRAVENOUS | Status: DC | PRN
  Administered 2020-10-26: 40 ug via INTRAVENOUS

## 2020-10-26 MED ORDER — SODIUM CHLORIDE 0.9 % IR SOLN
Status: DC | PRN
Start: 2020-10-26 — End: 2020-10-26
  Administered 2020-10-26: 3000 mL

## 2020-10-26 MED ORDER — ACETAMINOPHEN 500 MG PO TABS: 1000.0000 mg | ORAL_TABLET | Freq: Once | ORAL | Status: DC

## 2020-10-26 MED ORDER — PHENYLEPHRINE 40 MCG/ML (10ML) SYRINGE FOR IV PUSH (FOR BLOOD PRESSURE SUPPORT)
PREFILLED_SYRINGE | INTRAVENOUS | Status: AC
  Filled 2020-10-26: qty 10

## 2020-10-26 MED ORDER — FENTANYL CITRATE (PF) 100 MCG/2ML IJ SOLN
INTRAMUSCULAR | Status: AC
  Filled 2020-10-26: qty 2

## 2020-10-26 MED ORDER — GENTAMICIN SULFATE 40 MG/ML IJ SOLN: 5.0000 mg/kg | INTRAVENOUS | Status: DC

## 2020-10-26 MED ORDER — EPHEDRINE SULFATE-NACL 50-0.9 MG/10ML-% IV SOSY
PREFILLED_SYRINGE | INTRAVENOUS | Status: DC | PRN
  Administered 2020-10-26 (×2): 5 mg via INTRAVENOUS

## 2020-10-26 MED ORDER — PROPOFOL 10 MG/ML IV BOLUS
INTRAVENOUS | Status: DC | PRN
  Administered 2020-10-26: 150 mg via INTRAVENOUS

## 2020-10-26 MED ORDER — KETOROLAC TROMETHAMINE 30 MG/ML IJ SOLN
INTRAMUSCULAR | Status: DC | PRN
  Administered 2020-10-26: 30 mg via INTRAVENOUS

## 2020-10-26 SURGICAL SUPPLY — 26 items
BAG DRAIN URO-CYSTO SKYTR STRL (DRAIN) ×2 IMPLANT
BAG DRN UROCATH (DRAIN) ×1
BASKET LASER NITINOL 1.9FR (BASKET) ×2 IMPLANT
BSKT STON RTRVL 120 1.9FR (BASKET) ×1
CATH INTERMIT  6FR 70CM (CATHETERS) ×2 IMPLANT
CLOTH BEACON ORANGE TIMEOUT ST (SAFETY) ×2 IMPLANT
FIBER LASER FLEXIVA 365 (UROLOGICAL SUPPLIES) IMPLANT
GLOVE SURG ENC MOIS LTX SZ7.5 (GLOVE) ×2 IMPLANT
GOWN STRL REUS W/TWL LRG LVL3 (GOWN DISPOSABLE) ×2 IMPLANT
GUIDEWIRE ANG ZIPWIRE 038X150 (WIRE) ×2 IMPLANT
GUIDEWIRE STR DUAL SENSOR (WIRE) ×2 IMPLANT
IV NS 1000ML (IV SOLUTION)
IV NS 1000ML BAXH (IV SOLUTION) IMPLANT
IV NS IRRIG 3000ML ARTHROMATIC (IV SOLUTION) ×4 IMPLANT
KIT TURNOVER CYSTO (KITS) ×2 IMPLANT
MANIFOLD NEPTUNE II (INSTRUMENTS) ×2 IMPLANT
NS IRRIG 500ML POUR BTL (IV SOLUTION) ×2 IMPLANT
PACK CYSTO (CUSTOM PROCEDURE TRAY) ×2 IMPLANT
SHEATH URETERAL 12FRX28CM (UROLOGICAL SUPPLIES) ×2 IMPLANT
STENT POLARIS 5FRX24 (STENTS) ×2 IMPLANT
SYR 10ML LL (SYRINGE) ×2 IMPLANT
TRACTIP FLEXIVA PULS ID 200XHI (Laser) ×1 IMPLANT
TRACTIP FLEXIVA PULSE ID 200 (Laser) ×2
TUBE CONNECTING 12X1/4 (SUCTIONS) ×2 IMPLANT
TUBE FEEDING 8FR 16IN STR KANG (MISCELLANEOUS) ×2 IMPLANT
TUBING UROLOGY SET (TUBING) IMPLANT

## 2020-10-26 NOTE — Transfer of Care (Signed)
Immediate Anesthesia Transfer of Care Note  Patient: Ann Thompson  Procedure(s) Performed: Procedure(s) (LRB): CYSTOSCOPY WITH RETROGRADE PYELOGRAM, URETEROSCOPY AND STENT REPLACEMENT (Right) HOLMIUM LASER APPLICATION (Right)  Patient Location: PACU  Anesthesia Type: General  Level of Consciousness: awake, oriented, sedated and patient cooperative  Airway & Oxygen Therapy: Patient Spontanous Breathing and Patient connected to face mask oxygen  Post-op Assessment: Report given to PACU RN and Post -op Vital signs reviewed and stable  Post vital signs: Reviewed and stable  Complications: No apparent anesthesia complications  Last Vitals:  Vitals Value Taken Time  BP    Temp    Pulse 76 10/26/20 1222  Resp 17 10/26/20 1222  SpO2 95 % 10/26/20 1222  Vitals shown include unvalidated device data.  Last Pain:  Vitals:   10/26/20 0956  TempSrc:   PainSc: 4       Patients Stated Pain Goal: 6 (10/26/20 0956)  Complications: No complications documented.

## 2020-10-26 NOTE — OR Nursing (Signed)
Right Ureteral stent removed in [facility] OR #2 by Dr. Berneice Heinrich

## 2020-10-26 NOTE — Anesthesia Procedure Notes (Signed)
Procedure Name: LMA Insertion Date/Time: 10/26/2020 11:37 AM Performed by: Marny Lowenstein, CRNA Pre-anesthesia Checklist: Patient identified, Emergency Drugs available, Suction available and Patient being monitored Patient Re-evaluated:Patient Re-evaluated prior to induction Oxygen Delivery Method: Circle system utilized Preoxygenation: Pre-oxygenation with 100% oxygen Induction Type: IV induction Ventilation: Mask ventilation without difficulty LMA: LMA inserted LMA Size: 4.0 Number of attempts: 1 Airway Equipment and Method: Patient positioned with wedge pillow Placement Confirmation: positive ETCO2 and breath sounds checked- equal and bilateral Tube secured with: Tape Dental Injury: Injury to lip  Comments: Small "cut" noted on pt right side upper lip after LMA insertion. Cleaned with sterile water and lacrilube applied to site

## 2020-10-26 NOTE — H&P (Signed)
Ann Thompson is an 80 y.o. female.    Chief Complaint: Pre-Op RIGHT Ureteroscopic Stone Manipulation  HPI:   1- RIGHT ureteral / Renal Stones - 77mm Rt prox ureteral + scattered Rt renal stones by Er CT 09/2020 treated with stenting as hydro and infeciton. No left sided stones.  2 - Recent Pyelonephritis - treated with IV  Bridged to PO ABX 09/2020 for pyelo from obstrucitn stone. UCX non-clonal, but improved on rocephin, then keflex. She is obese diabetic.  Today "Ann Thompson" is seen to proceed with RIGHT ureteroscopic stone manipulation with goal of stone free. No interval fevers. C19 screen negative. She has been on keflex pre-op as RX'd.   Past Medical History:  Diagnosis Date  . Arthritis   . DM type 2 (diabetes mellitus, type 2) (HCC)   . GERD (gastroesophageal reflux disease)   . History of kidney stones   . Hyperlipemia   . Hypertension   . Left leg pain   . Wears hearing aid in both ears     Past Surgical History:  Procedure Laterality Date  . ABDOMINAL HYSTERECTOMY  1982   partial  . APPENDECTOMY  1957  . BACK SURGERY  07/12/2020   lower at [facility]  . CYSTOSCOPY W/ URETERAL STENT PLACEMENT Right 10/06/2020   Procedure: CYSTOSCOPY WITH RETROGRADE PYELOGRAM/URETERAL STENT PLACEMENT;  Surgeon: Sebastian Ache, MD;  Location: [facility] OR;  Service: Urology;  Laterality: Right;  . JOINT REPLACEMENT  2016   both knees partial replacement    Family History  Family history unknown: Yes   Social History:  reports that she quit smoking about 39 years ago. Her smoking use included cigarettes. She has a 25.00 pack-year smoking history. She has never used smokeless tobacco. She reports that she does not drink alcohol and does not use drugs.  Allergies: No Known Allergies  No medications prior to admission.    No results found for this or any previous visit (from the past 48 hour(s)). No results found.  Review of Systems  Constitutional: Negative for chills and fever.   Genitourinary: Positive for urgency.  All other systems reviewed and are negative.   Height 5' (1.524 m), weight 72.6 kg. Physical Exam Vitals reviewed.  HENT:     Head: Normocephalic.     Nose: Nose normal.  Eyes:     Pupils: Pupils are equal, round, and reactive to light.  Cardiovascular:     Rate and Rhythm: Normal rate.  Abdominal:     Comments: Stable large truncal obesity.   Genitourinary:    Comments: NO CVAT at present.  Musculoskeletal:        General: Normal range of motion.     Cervical back: Normal range of motion.  Neurological:     General: No focal deficit present.     Mental Status: She is alert.  Psychiatric:        Mood and Affect: Mood normal.      Assessment/Plan  Proceed as planned with RIGHT ureteroscopic stone manipulation with goal of stone free and reduce nidus of future infections. Risks, beneftis, alternatives, expected peri-op course discussed previously and reiterated today.   Sebastian Ache, MD 10/26/2020, 7:52 AM

## 2020-10-26 NOTE — Anesthesia Postprocedure Evaluation (Signed)
Anesthesia Post Note  Patient: Ann Thompson  Procedure(s) Performed: CYSTOSCOPY WITH RETROGRADE PYELOGRAM, URETEROSCOPY AND STENT REPLACEMENT (Right Pelvis) HOLMIUM LASER APPLICATION (Right Pelvis)     Patient location during evaluation: PACU Anesthesia Type: General Level of consciousness: awake and alert Pain management: pain level controlled Vital Signs Assessment: post-procedure vital signs reviewed and stable Respiratory status: spontaneous breathing, nonlabored ventilation, respiratory function stable and patient connected to nasal cannula oxygen Cardiovascular status: blood pressure returned to baseline and stable Postop Assessment: no apparent nausea or vomiting Anesthetic complications: no   No complications documented.  Last Vitals:  Vitals:   10/26/20 1256 10/26/20 1300  BP:  (!) 157/64  Pulse: 72 71  Resp: 16 15  Temp: (!) 36.4 C   SpO2: 95% 96%    Last Pain:  Vitals:   10/26/20 1256  TempSrc:   PainSc: 0-No pain                 Kennieth Rad

## 2020-10-26 NOTE — Anesthesia Preprocedure Evaluation (Signed)
Anesthesia Evaluation  Patient identified by MRN, date of birth, ID band Patient awake    Reviewed: Allergy & Precautions, NPO status , Patient's Chart, lab work & pertinent test results  Airway Mallampati: II  TM Distance: >3 FB     Dental  (+) Dental Advisory Given   Pulmonary former smoker,    breath sounds clear to auscultation       Cardiovascular hypertension, Pt. on medications and Pt. on home beta blockers  Rhythm:Regular Rate:Normal     Neuro/Psych negative neurological ROS     GI/Hepatic Neg liver ROS, GERD  ,  Endo/Other  diabetes, Type 2, Oral Hypoglycemic Agents  Renal/GU Renal disease     Musculoskeletal  (+) Arthritis ,   Abdominal   Peds  Hematology negative hematology ROS (+)   Anesthesia Other Findings   Reproductive/Obstetrics                             Anesthesia Physical Anesthesia Plan  ASA: II  Anesthesia Plan: General   Post-op Pain Management:    Induction: Intravenous  PONV Risk Score and Plan: 3 and Dexamethasone, Ondansetron and Treatment may vary due to age or medical condition  Airway Management Planned: LMA  Additional Equipment:   Intra-op Plan:   Post-operative Plan: Extubation in OR  Informed Consent: I have reviewed the patients History and Physical, chart, labs and discussed the procedure including the risks, benefits and alternatives for the proposed anesthesia with the patient or authorized representative who has indicated his/her understanding and acceptance.     Dental advisory given  Plan Discussed with: CRNA  Anesthesia Plan Comments:         Anesthesia Quick Evaluation

## 2020-10-26 NOTE — Brief Op Note (Signed)
10/26/2020  12:11 PM  PATIENT:  Ann Thompson  80 y.o. female  PRE-OPERATIVE DIAGNOSIS:  RIGHT URETERAL AND RENAL STONES, HISTORY OF PYELONEPHRITIS  POST-OPERATIVE DIAGNOSIS:  RIGHT URETERAL AND RENAL STONES, HISTORY OF PYELONEPHRITIS  PROCEDURE:  Procedure(s) with comments: CYSTOSCOPY WITH RETROGRADE PYELOGRAM, URETEROSCOPY AND STENT REPLACEMENT (Right) - 90 MINS HOLMIUM LASER APPLICATION (Right)  SURGEON:  Surgeon(s) and Role:    Sebastian Ache, MD - Primary  PHYSICIAN ASSISTANT:   ASSISTANTS: none   ANESTHESIA:   general  EBL:  0 mL   BLOOD ADMINISTERED:none  DRAINS: Penrose drain in the NONE   LOCAL MEDICATIONS USED:  NONE  SPECIMEN:  No Specimen  DISPOSITION OF SPECIMEN:  N/A  COUNTS:  YES  TOURNIQUET:  * No tourniquets in log *  DICTATION: .Other Dictation: Dictation Number  [id number]  PLAN OF CARE: Discharge to home after PACU  PATIENT DISPOSITION:  PACU - hemodynamically stable.   Delay start of Pharmacological VTE agent (>24hrs) due to surgical blood loss or risk of bleeding: no

## 2020-10-26 NOTE — Discharge Instructions (Signed)
1 - You may have urinary urgency (bladder spasms) and bloody urine on / off with stent in place. This is normal.  2 - Remove tethered stent on Monday morning at home by pulling on string, then blue-white plastic tubing, and discarding. Office is open Monday if any problems arise.   3 - Call MD or go to ER for fever >102, severe pain / nausea / vomiting not relieved by medications, or acute change in medical status  [facility] [phone] Post Ureteroscopy With Stent Instructions  Definitions:  Ureter: The duct that transports urine from the kidney to the bladder. Stent:   A plastic hollow tube that is placed into the ureter, from the kidney to the bladder to prevent the ureter from swelling shut.  GENERAL INSTRUCTIONS:  Despite the fact that no skin incisions were used, the area around the ureter and bladder is raw and irritated. The stent is a foreign body which will further irritate the bladder wall. This irritation is manifested by increased frequency of urination, both day and night, and by an increase in the urge to urinate. In some, the urge to urinate is present almost always. Sometimes the urge is strong enough that you may not be able to stop yourself from urinating. The only real cure is to remove the stent and then give time for the bladder wall to heal which can't be done until the danger of the ureter swelling shut has passed, which varies.  You may see some blood in your urine while the stent is in place and a few days afterwards. Do not be alarmed, even if the urine was clear for a while. Get off your feet and drink lots of fluids until clearing occurs. If you start to pass clots or don't improve, call us.  DIET: You may return to your normal diet immediately. Because of the raw surface of your bladder, alcohol, spicy foods, acid type foods and drinks with caffeine may cause irritation or frequency and should be used in moderation. To keep your urine flowing  freely and to avoid constipation, drink plenty of fluids during the day ( 8-10 glasses ). Tip: Avoid cranberry juice because it is very acidic.  ACTIVITY: Your physical activity doesn't need to be restricted. However, if you are very active, you may see some blood in your urine. We suggest that you reduce your activity under these circumstances until the bleeding has stopped.  BOWELS: It is important to keep your bowels regular during the postoperative period. Straining with bowel movements can cause bleeding. A bowel movement every other day is reasonable. Use a mild laxative if needed, such as Milk of Magnesia 2-3 tablespoons, or 2 Dulcolax tablets. Call if you continue to have problems. If you have been taking narcotics for pain, before, during or after your surgery, you may be constipated. Take a laxative if necessary.   MEDICATION: You should resume your pre-surgery medications unless told not to. In addition you will often be given an antibiotic to prevent infection. These should be taken as prescribed until the bottles are finished unless you are having an unusual reaction to one of the drugs.  PROBLEMS YOU SHOULD REPORT TO [place]:  Fevers over 100.5 Fahrenheit.  Heavy bleeding, or clots ( See above notes about blood in urine ).  Inability to urinate.  Drug reactions ( hives, rash, nausea, vomiting, diarrhea ).  Severe burning or pain with urination that is not improving.  FOLLOW-UP: You will need a follow-up appointment  to monitor your progress. Call for this appointment at the number listed above. Usually the first appointment will be about three to fourteen days after your surgery.  Post Anesthesia Home Care Instructions  Activity: Get plenty of rest for the remainder of the day. A responsible individual must stay with you for 24 hours following the procedure.  For the next 24 hours, DO NOT: -Drive a car -Advertising copywriter -Drink alcoholic beverages -Take any medication  unless instructed by your physician -Make any legal decisions or sign important papers.  Meals: Start with liquid foods such as gelatin or soup. Progress to regular foods as tolerated. Avoid greasy, spicy, heavy foods. If nausea and/or vomiting occur, drink only clear liquids until the nausea and/or vomiting subsides. Call your physician if vomiting continues.  Special Instructions/Symptoms: Your throat may feel dry or sore from the anesthesia or the breathing tube placed in your throat during surgery. If this causes discomfort, gargle with warm salt water. The discomfort should disappear within 24 hours.

## 2020-10-29 ENCOUNTER — Encounter (HOSPITAL_BASED_OUTPATIENT_CLINIC_OR_DEPARTMENT_OTHER): Payer: Self-pay | Admitting: Urology

## 2020-10-29 NOTE — Op Note (Signed)
NAMETACHE, BOBST MEDICAL RECORD [id number] ACCOUNT [id number] DATE OF BIRTH:Jul 06, 1941 FACILITY: [facility] LOCATION: [facility] PHYSICIAN:Peterson Mathey, MD  OPERATIVE REPORT  DATE OF PROCEDURE:  10/26/2020  PREOPERATIVE DIAGNOSIS:  Right renal ureteral stone, history of urosepsis.  PROCEDURE: 1.  Cystoscopy, right retrograde pyelogram, interpretation. 2.  Right ureteroscopy with laser lithotripsy. 3.  Exchange of right ureteral stent, 5 x 24 Polaris with tether.  ESTIMATED BLOOD LOSS:  Nil.  MEDICATIONS:  None.  SPECIMENS:  Right renal stone fragments dusted and discarded.  OPERATIVE FINDINGS: 1.  Previous right proximal ureteral stone, retrograde positioned to the renal pelvis, very, very soft consistency. 2.  Upper pole renal stone.  Also, incredibly soft consistency. 3.  Complete resolution of all accessible stone fragments larger than one-third mm following laser lithotripsy and basket extraction. 4.  Successful replacement of right ureteral stent, proximal end in renal pelvis, distal end in urinary bladder.  INDICATIONS:  The patient is a pleasant 80 year old lady with history of obesity and diabetes.  She was found on workup of colicky flank pain and then high fevers to have a proximal right ureteral stone with early urosepsis picture.  She underwent  stenting in the urgent setting at the end of 09/27/2020 for renal decompression.  She had hospital admission for intravenous antibiotics that were bridged to oral therapy.  She cleared her infectious parameters and was sent home.  She now presents for  definitive management of her stone.  She remains fever free.  She began antibiotics again several days preop.  Informed consent was obtained and placed in medical record.  DESCRIPTION OF PROCEDURE:  The patient being herself verified, procedure being right ureteroscopic stimulation was confirmed.  Procedure timeout was performed.  Intravenous antibiotics administered, general LMA  anesthesia induced.  The patient was placed  into a low lithotomy position.  Sterile field was created, prepping and draping the patient's vagina, introitus and proximal thighs using iodine.  Cystourethroscopy was performed using 21-French rigid cystoscope with offset lens.  There was significant  cystocele noted.  Inspection of bladder revealed no diverticula, calcifications, papillary lesions.  Distal right stent was seen easily.  It was grasped, brought to the level of the urethral meatus.  A 0.038 ZIPwire was advanced to the level of the  kidney and the stent was exchanged for open-ended catheter and right retrograde pyelogram was obtained.  Right retrograde pyelogram demonstrated a single right ureter with single system right kidney.  No obvious filling defects or narrowing noted.  ZIPwire was once again advanced, set aside as a safety wire.  An 8-French feeding tube was placed in the  urinary bladder for pressure release, and semirigid ureteroscopy was performed of the entire length of right ureter alongside a separate sensor working wire.  No mucosal abnormalities were found.  The prior right ureteral stone was not visualized, likely  retrograde positioned into the renal pelvis.  Semirigid scope was exchanged for a 12/14 short length ureteral access sheath at the level of the mid ureter using continuous fluoroscopic guidance and flexible digital ureteroscopy was performed of the  proximal right ureter and systematic inspection of the right kidney including all calices x3.  In the renal pelvis, there was a free floating stone consistent with prior retrograde positioning of her ureteral stone as it was approximately 6-8 mm.  It  appeared very white and proteinaceous.  It was grasped with the Escape basket and essentially disintegrated with basket manipulation into fragments that were less than one-third mm.  Upper pole  of the kidney was also inspected as there was another  calcification noted on  imaging and a much larger stone was noted in the upper pole infundibulum.  This also appeared to be very, very soft in consistency with a size being greater than a centimeter.  As such, holmium laser energy applied at 70 setting of  0.2 joules and 30 Hz and the stone was completely dusted into innumerable tiny fragments each much less than one-third mm.  Again, the stone was also incredibly soft, also being proteinaceous type.  Unfortunately, the fragments were so small.  There was  really no reasonable way to obtain any for composition analysis, but again the visualization was completely consistent with proteinaceous type stone.  All calices were inspected.  Again, there was complete resolution of all accessible stone fragments  larger than one-third mm.  Access sheath was removed under direct vision.  No significant mucosal abnormalities were found.  Given her prior infectious nature, it was felt that brief interval stenting with tethered stent would be most prudent.  As such,  a new 5 x 24 Polaris-type stent was placed over remaining safety wire using fluoroscopic guidance.  Good proximal and distal planes were noted.  Tether was left in place, trimmed to length and tucked per vagina and the procedure was terminated.  The  patient tolerated the procedure well.  No immediate perioperative complications.  The patient was taken to the postanesthesia care unit in stable condition.  Plan for discharge home.  IN/NUANCE  D:10/26/2020 T:10/27/2020 JOB:[id number]

## 2020-11-27 DIAGNOSIS — M961 Postlaminectomy syndrome, not elsewhere classified: Secondary | ICD-10-CM | POA: Insufficient documentation

## 2020-12-20 ENCOUNTER — Encounter: Payer: Self-pay | Admitting: Adult Health

## 2020-12-20 ENCOUNTER — Ambulatory Visit (INDEPENDENT_AMBULATORY_CARE_PROVIDER_SITE_OTHER): Payer: Medicare Other | Admitting: Adult Health

## 2020-12-20 ENCOUNTER — Other Ambulatory Visit: Payer: Self-pay

## 2020-12-20 VITALS — BP 130/82 | HR 71 | Temp 98.5°F | Ht 60.0 in | Wt 170.6 lb

## 2020-12-20 DIAGNOSIS — E118 Type 2 diabetes mellitus with unspecified complications: Secondary | ICD-10-CM

## 2020-12-20 DIAGNOSIS — I10 Essential (primary) hypertension: Secondary | ICD-10-CM

## 2020-12-20 DIAGNOSIS — G2581 Restless legs syndrome: Secondary | ICD-10-CM

## 2020-12-20 LAB — POCT GLYCOSYLATED HEMOGLOBIN (HGB A1C): Hemoglobin A1C: 6.9 % — AB (ref 4.0–5.6)

## 2020-12-20 MED ORDER — SITAGLIPTIN PHOSPHATE 25 MG PO TABS: 25.0000 mg | ORAL_TABLET | Freq: Every day | ORAL | 1 refills | Status: DC

## 2020-12-20 MED ORDER — PRAMIPEXOLE DIHYDROCHLORIDE 1.5 MG PO TABS: 1.5000 mg | ORAL_TABLET | Freq: Every evening | ORAL | 1 refills | Status: DC

## 2020-12-20 NOTE — Progress Notes (Signed)
Subjective:    Patient ID: Ann Thompson, female    DOB: [birth year], 80 y.o.   MRN: [id number]  HPI 80 year old female who  has a past medical history of Arthritis, DM type 2 (diabetes mellitus, type 2) (HCC), GERD (gastroesophageal reflux disease), History of kidney stones, Hyperlipemia, Hypertension, Left leg pain, and Wears hearing aid in both ears.  She presents to the office today for three month follow up regarding DM and HTN   DM -currently prescribed Januvia 25 mg daily.  She has been well controlled on this medication in the past, unfortunately her last A1c had increased from 6.7-7.5.  She was advised to work on lifestyle modifications prior to adding medication therapy. Lab Results  Component Value Date   HGBA1C 7.5 (H) 09/21/2020   HTN -takes Ziac 2.5-6.25 mg daily and Dyazide 37.5-25 mg daily.  She denies dizziness, lightheadedness, chest pain, shortness of breath BP Readings from Last 3 Encounters:  12/20/20 130/82  10/26/20 (!) 165/62  10/11/20 (!) 160/90   Restless Legs syndrome - chronic issue. Has been on 1 mg QHS for multiple years. Feels as though she needs to increase her dose. At home she has been taking 1.5 mg and feels controlled on this   Review of Systems See HPI   Past Medical History:  Diagnosis Date  . Arthritis   . DM type 2 (diabetes mellitus, type 2) (HCC)   . GERD (gastroesophageal reflux disease)   . History of kidney stones   . Hyperlipemia   . Hypertension   . Left leg pain   . Wears hearing aid in both ears     Social History   Socioeconomic History  . Marital status: Married    Spouse name: Not on file  . Number of children: Not on file  . Years of education: Not on file  . Highest education level: Not on file  Occupational History  . Not on file  Tobacco Use  . Smoking status: Former Smoker    Packs/day: 1.00    Years: 25.00    Pack years: 25.00    Types: Cigarettes    Quit date: 09/08/1981    Years since quitting: 39.3  .  Smokeless tobacco: Never Used  Vaping Use  . Vaping Use: Never used  Substance and Sexual Activity  . Alcohol use: Never  . Drug use: Never  . Sexual activity: Not on file  Other Topics Concern  . Not on file  Social History Narrative  . Not on file   Social Determinants of Health   Financial Resource Strain: Not on file  Food Insecurity: Not on file  Transportation Needs: Not on file  Physical Activity: Not on file  Stress: Not on file  Social Connections: Not on file  Intimate Partner Violence: Not on file    Past Surgical History:  Procedure Laterality Date  . ABDOMINAL HYSTERECTOMY  1982   partial  . APPENDECTOMY  1957  . BACK SURGERY  07/12/2020   lower at [facility]  . CYSTOSCOPY W/ URETERAL STENT PLACEMENT Right 10/06/2020   Procedure: CYSTOSCOPY WITH RETROGRADE PYELOGRAM/URETERAL STENT PLACEMENT;  Surgeon: Sebastian Ache, MD;  Location: [facility] OR;  Service: Urology;  Laterality: Right;  . CYSTOSCOPY WITH RETROGRADE PYELOGRAM, URETEROSCOPY AND STENT PLACEMENT Right 10/26/2020   Procedure: CYSTOSCOPY WITH RETROGRADE PYELOGRAM, URETEROSCOPY AND STENT REPLACEMENT;  Surgeon: Sebastian Ache, MD;  Location: [facility];  Service: Urology;  Laterality: Right;  90 MINS  . HOLMIUM  LASER APPLICATION Right 10/26/2020   Procedure: HOLMIUM LASER APPLICATION;  Surgeon: Sebastian Ache, MD;  Location: [facility];  Service: Urology;  Laterality: Right;  . JOINT REPLACEMENT  2016   both knees partial replacement    Family History  Family history unknown: Yes    No Known Allergies  Current Outpatient Medications on File Prior to Visit  Medication Sig Dispense Refill  . acetaminophen (TYLENOL) 500 MG tablet Take 1,000 mg by mouth every 6 (six) hours as needed.    . bisoprolol-hydrochlorothiazide (ZIAC) 2.5-6.25 MG tablet Take 1 tablet by mouth daily. 90 tablet 3  . fluticasone (FLONASE) 50 MCG/ACT nasal spray Place 2 sprays into both nostrils  daily. 16 g 6  . Glucosamine-Chondroit-Vit C-Mn (GLUCOSAMINE 1500 COMPLEX) CAPS Take 1 capsule by mouth daily.    Marland Kitchen HYDROcodone-acetaminophen (NORCO/VICODIN) 5-325 MG tablet Take 1 tablet by mouth every 6 (six) hours as needed for moderate pain or severe pain. Post-operatively 10 tablet 0  . MAGNESIUM-POTASSIUM PO Take 1 tablet by mouth daily.    . Multiple Vitamin (MULTIVITAMIN) tablet Take 1 tablet by mouth daily.    . pramipexole (MIRAPEX) 1 MG tablet Take 1 mg by mouth at bedtime.    . sitaGLIPtin (JANUVIA) 25 MG tablet Take 1 tablet (25 mg total) by mouth daily. 90 tablet 0  . Tetrahydrozoline-Zn Sulfate (EYE DROPS A/C OP) Apply 1 drop to eye daily as needed (dry eyes).    . triamterene-hydrochlorothiazide (DYAZIDE) 37.5-25 MG capsule Take 1 capsule by mouth daily.    Marland Kitchen UNABLE TO FIND Med Name: cbd oil to knees prn     No current facility-administered medications on file prior to visit.    BP 130/82 (BP Location: Left Arm, Patient Position: Sitting, Cuff Size: Large)   Pulse 71   Temp 98.5 F (36.9 C) (Oral)   Ht 5' (1.524 m)   Wt 170 lb 9.6 oz (77.4 kg)   SpO2 95%   BMI 33.32 kg/m       Objective:   Physical Exam Vitals and nursing note reviewed.  Constitutional:      Appearance: Normal appearance.  Cardiovascular:     Rate and Rhythm: Normal rate and regular rhythm.  Neurological:     Mental Status: She is alert.  Psychiatric:        Mood and Affect: Mood normal.        Behavior: Behavior normal.        Thought Content: Thought content normal.        Judgment: Judgment normal.       Assessment & Plan:  1. Controlled type 2 diabetes mellitus with complication, without long-term current use of insulin (HCC)  - POC HgB A1c- 6.9 - has improved.  - Continue with JAuavia 25 mg  - Follow up in 6 months  - sitaGLIPtin (JANUVIA) 25 MG tablet; Take 1 tablet (25 mg total) by mouth daily.  Dispense: 90 tablet; Refill: 1  2. Primary hypertension - Controlled.  - No  change in medications   3. Restless leg syndrome - Will increase Mirapex to 1.5 mg  - pramipexole (MIRAPEX) 1.5 MG tablet; Take 1 tablet (1.5 mg total) by mouth at bedtime.  Dispense: 90 tablet; Refill: 1  [name], NP

## 2020-12-20 NOTE — Patient Instructions (Addendum)
It was great seeing you today   Your A1c went down to 6.9 from 7.5   I am going to see you back in 6 months

## 2020-12-25 DIAGNOSIS — M25552 Pain in left hip: Secondary | ICD-10-CM | POA: Insufficient documentation

## 2021-01-21 ENCOUNTER — Other Ambulatory Visit: Payer: Self-pay | Admitting: Adult Health

## 2021-01-21 ENCOUNTER — Telehealth: Payer: Self-pay | Admitting: Adult Health

## 2021-01-21 NOTE — Telephone Encounter (Signed)
Left message for patient to call back and schedule Medicare Annual Wellness Visit (AWV) either virtually or in office.   AWV-I PER PALMETTO 09/08/09 please schedule at anytime with [facility] Nurse Health Advisor 1 or 2   This should be a 45 minute visit.  

## 2021-01-29 ENCOUNTER — Ambulatory Visit: Payer: Medicare Other

## 2021-01-30 ENCOUNTER — Other Ambulatory Visit: Payer: Self-pay

## 2021-01-30 ENCOUNTER — Ambulatory Visit: Payer: Medicare Other

## 2021-01-30 ENCOUNTER — Ambulatory Visit (INDEPENDENT_AMBULATORY_CARE_PROVIDER_SITE_OTHER): Payer: Medicare Other

## 2021-01-30 VITALS — BP 132/72 | HR 80 | Temp 97.9°F | Ht 60.0 in | Wt 168.0 lb

## 2021-01-30 DIAGNOSIS — Z780 Asymptomatic menopausal state: Secondary | ICD-10-CM

## 2021-01-30 DIAGNOSIS — Z0000 Encounter for general adult medical examination without abnormal findings: Secondary | ICD-10-CM | POA: Diagnosis not present

## 2021-01-30 NOTE — Progress Notes (Signed)
Subjective:   Ann Thompson is a 80 y.o. female who presents for Medicare Annual (Subsequent) preventive examination.  Review of Systems    n/a       Objective:    There were no vitals filed for this visit. There is no height or weight on file to calculate BMI.  Advanced Directives 10/26/2020 10/08/2020  Does Patient Have a Medical Advance Directive? Yes Yes  Type of Advance Directive - Healthcare Power of [place];Living will  Does patient want to make changes to medical advance directive? No - Patient declined No - Patient declined  Copy of Healthcare Power of Attorney in Chart? - No - copy requested    Current Medications (verified) Outpatient Encounter Medications as of 01/30/2021  Medication Sig  . acetaminophen (TYLENOL) 500 MG tablet Take 1,000 mg by mouth every 6 (six) hours as needed.  . bisoprolol-hydrochlorothiazide (ZIAC) 2.5-6.25 MG tablet Take 1 tablet by mouth daily.  . fluticasone (FLONASE) 50 MCG/ACT nasal spray Place 2 sprays into both nostrils daily.  . Glucosamine-Chondroit-Vit C-Mn (GLUCOSAMINE 1500 COMPLEX) CAPS Take 1 capsule by mouth daily.  Marland Kitchen HYDROcodone-acetaminophen (NORCO/VICODIN) 5-325 MG tablet Take 1 tablet by mouth every 6 (six) hours as needed for moderate pain or severe pain. Post-operatively  . MAGNESIUM-POTASSIUM PO Take 1 tablet by mouth daily.  . Multiple Vitamin (MULTIVITAMIN) tablet Take 1 tablet by mouth daily.  . pramipexole (MIRAPEX) 1.5 MG tablet Take 1 tablet (1.5 mg total) by mouth at bedtime.  . sitaGLIPtin (JANUVIA) 25 MG tablet Take 1 tablet (25 mg total) by mouth daily.  Jeananne Rama Sulfate (EYE DROPS A/C OP) Apply 1 drop to eye daily as needed (dry eyes).  . triamterene-hydrochlorothiazide (MAXZIDE-25) 37.5-25 MG tablet TAKE 1 TABLET BY MOUTH EVERY DAY  . UNABLE TO FIND Med Name: cbd oil to knees prn   No facility-administered encounter medications on file as of 01/30/2021.    Allergies (verified) Patient has no known  allergies.   History: Past Medical History:  Diagnosis Date  . Arthritis   . DM type 2 (diabetes mellitus, type 2) (HCC)   . GERD (gastroesophageal reflux disease)   . History of kidney stones   . Hyperlipemia   . Hypertension   . Left leg pain   . Wears hearing aid in both ears    Past Surgical History:  Procedure Laterality Date  . ABDOMINAL HYSTERECTOMY  1982   partial  . APPENDECTOMY  1957  . BACK SURGERY  07/12/2020   lower at [facility]  . CYSTOSCOPY W/ URETERAL STENT PLACEMENT Right 10/06/2020   Procedure: CYSTOSCOPY WITH RETROGRADE PYELOGRAM/URETERAL STENT PLACEMENT;  Surgeon: Sebastian Ache, MD;  Location: [facility] OR;  Service: Urology;  Laterality: Right;  . CYSTOSCOPY WITH RETROGRADE PYELOGRAM, URETEROSCOPY AND STENT PLACEMENT Right 10/26/2020   Procedure: CYSTOSCOPY WITH RETROGRADE PYELOGRAM, URETEROSCOPY AND STENT REPLACEMENT;  Surgeon: Sebastian Ache, MD;  Location: [facility];  Service: Urology;  Laterality: Right;  90 MINS  . HOLMIUM LASER APPLICATION Right 10/26/2020   Procedure: HOLMIUM LASER APPLICATION;  Surgeon: Sebastian Ache, MD;  Location: [facility];  Service: Urology;  Laterality: Right;  . JOINT REPLACEMENT  2016   both knees partial replacement   Family History  Family history unknown: Yes   Social History   Socioeconomic History  . Marital status: Married    Spouse name: Not on file  . Number of children: Not on file  . Years of education: Not on file  . Highest  education level: Not on file  Occupational History  . Not on file  Tobacco Use  . Smoking status: Former Smoker    Packs/day: 1.00    Years: 25.00    Pack years: 25.00    Types: Cigarettes    Quit date: 09/08/1981    Years since quitting: 39.4  . Smokeless tobacco: Never Used  Vaping Use  . Vaping Use: Never used  Substance and Sexual Activity  . Alcohol use: Never  . Drug use: Never  . Sexual activity: Not on file  Other Topics Concern   . Not on file  Social History Narrative  . Not on file   Social Determinants of Health   Financial Resource Strain: Not on file  Food Insecurity: Not on file  Transportation Needs: Not on file  Physical Activity: Not on file  Stress: Not on file  Social Connections: Not on file    Tobacco Counseling Counseling given: Not Answered   Clinical Intake:                 Diabetic?no         Activities of Daily Living In your present state of health, do you have any difficulty performing the following activities: 10/26/2020 10/08/2020  Hearing? N Y  Vision? N N  Difficulty concentrating or making decisions? N N  Walking or climbing stairs? Y Y  Dressing or bathing? N N  Doing errands, shopping? - Y    Patient Care Team: [name], NP as PCP - General (Family Medicine)  Indicate any recent Medical Services you may have received from other than [facility] providers in the past year (date may be approximate).     Assessment:   This is a routine wellness examination for [place].  Hearing/Vision screen No exam data present  Dietary issues and exercise activities discussed:    Goals Addressed   None    Depression Screen PHQ 2/9 Scores 09/21/2020  PHQ - 2 Score 0    Fall Risk Fall Risk  09/21/2020  Falls in the past year? 0    FALL RISK PREVENTION PERTAINING TO THE HOME:  Any stairs in or around the home? No  If so, are there any without handrails? Yes  Home free of loose throw rugs in walkways, pet beds, electrical cords, etc? Yes  Adequate lighting in your home to reduce risk of falls? Yes   ASSISTIVE DEVICES UTILIZED TO PREVENT FALLS:  Life alert? No  Use of a cane, walker or w/c? Yes  Grab bars in the bathroom? Yes  Shower chair or bench in shower? Yes  Elevated toilet seat or a handicapped toilet? Yes   TIMED UP AND GO:  Was the test performed? Yes .  Length of time to ambulate 10 feet: 20 sec.   Gait slow and steady with assistive  device  Cognitive Function:        Immunizations Immunization History  Administered Date(s) Administered  . Fluad Quad(high Dose 65+) 06/22/2020    TDAP status: Up to date  Flu Vaccine status: Up to date  Pneumococcal vaccine status: Up to date  Covid-19 vaccine status: Completed vaccines  Qualifies for Shingles Vaccine? Yes   Zostavax completed No   Shingrix Completed?: No.    Education has been provided regarding the importance of this vaccine. Patient has been advised to call insurance company to determine out of pocket expense if they have not yet received this vaccine. Advised may also receive vaccine at local pharmacy or  Health Dept. Verbalized acceptance and understanding.  Screening Tests Health Maintenance  Topic Date Due  . COVID-19 Vaccine (1) Never done  . TETANUS/TDAP  Never done  . DEXA SCAN  Never done  . PNA vac Low Risk Adult (1 of 2 - PCV13) Never done  . INFLUENZA VACCINE  04/08/2021  . HPV VACCINES  Aged Out    Health Maintenance  Health Maintenance Due  Topic Date Due  . COVID-19 Vaccine (1) Never done  . TETANUS/TDAP  Never done  . DEXA SCAN  Never done  . PNA vac Low Risk Adult (1 of 2 - PCV13) Never done    Colorectal cancer screening: No longer required.   Mammogram status: No longer required due to age.  Bone Density status: Ordered 01/30/2021. Pt provided with contact info and advised to call to schedule appt.  Lung Cancer Screening: (Low Dose CT Chest recommended if Age 70-80 years, 30 pack-year currently smoking OR have quit w/in 15years.) does not qualify.   Lung Cancer Screening Referral: n/a  Additional Screening:  Hepatitis C Screening: does not qualify  Vision Screening: Recommended annual ophthalmology exams for early detection of glaucoma and other disorders of the eye. Is the patient up to date with their annual eye exam?  Yes  Who is the provider or what is the name of the office in which the patient attends annual  eye exams? Unknown  If pt is not established with a provider, would they like to be referred to a provider to establish care? No .   Dental Screening: Recommended annual dental exams for proper oral hygiene  Community Resource Referral / Chronic Care Management: CRR required this visit?  No   CCM required this visit?  No      Plan:     I have personally reviewed and noted the following in the patient's chart:   . Medical and social history . Use of alcohol, tobacco or illicit drugs  . Current medications and supplements including opioid prescriptions.  . Functional ability and status . Nutritional status . Physical activity . Advanced directives . List of other physicians . Hospitalizations, surgeries, and ER visits in previous 12 months . Vitals . Screenings to include cognitive, depression, and falls . Referrals and appointments  In addition, I have reviewed and discussed with patient certain preventive protocols, quality metrics, and best practice recommendations. A written personalized care plan for preventive services as well as general preventive health recommendations were provided to patient.     March Rummage, LPN   12/16/7351   Nurse Notes: none

## 2021-01-30 NOTE — Patient Instructions (Signed)
Ann Thompson , Thank you for taking time to come for your Medicare Wellness Visit. I appreciate your ongoing commitment to your health goals. Please review the following plan we discussed and let me know if I can assist you in the future.   Screening recommendations/referrals: Colonoscopy: no longer required  Mammogram: no longer required  Bone Density: referral completed 01/30/2021 Recommended yearly ophthalmology/optometry visit for glaucoma screening and checkup Recommended yearly dental visit for hygiene and checkup  Vaccinations: Influenza vaccine: current due in fall 2022 Pneumococcal vaccine: per pt completed series  Tdap vaccine: current  Shingles vaccine: current per pt     Advanced directives: will provide copies   Conditions/risks identified: back /leg pain   Next appointment: 02/06/2021  130pm  [name]    Preventive Care 65 Years and Older, Female Preventive care refers to lifestyle choices and visits with your health care provider that can promote health and wellness. What does preventive care include?  A yearly physical exam. This is also called an annual well check.  Dental exams once or twice a year.  Routine eye exams. Ask your health care provider how often you should have your eyes checked.  Personal lifestyle choices, including:  Daily care of your teeth and gums.  Regular physical activity.  Eating a healthy diet.  Avoiding tobacco and drug use.  Limiting alcohol use.  Practicing safe sex.  Taking low-dose aspirin every day.  Taking vitamin and mineral supplements as recommended by your health care provider. What happens during an annual well check? The services and screenings done by your health care provider during your annual well check will depend on your age, overall health, lifestyle risk factors, and family history of disease. Counseling  Your health care provider may ask you questions about your:  Alcohol use.  Tobacco  use.  Drug use.  Emotional well-being.  Home and relationship well-being.  Sexual activity.  Eating habits.  History of falls.  Memory and ability to understand (cognition).  Work and work Astronomer.  Reproductive health. Screening  You may have the following tests or measurements:  Height, weight, and BMI.  Blood pressure.  Lipid and cholesterol levels. These may be checked every 5 years, or more frequently if you are over 73 years old.  Skin check.  Lung cancer screening. You may have this screening every year starting at age 32 if you have a 30-pack-year history of smoking and currently smoke or have quit within the past 15 years.  Fecal occult blood test (FOBT) of the stool. You may have this test every year starting at age 41.  Flexible sigmoidoscopy or colonoscopy. You may have a sigmoidoscopy every 5 years or a colonoscopy every 10 years starting at age 52.  Hepatitis C blood test.  Hepatitis B blood test.  Sexually transmitted disease (STD) testing.  Diabetes screening. This is done by checking your blood sugar (glucose) after you have not eaten for a while (fasting). You may have this done every 1-3 years.  Bone density scan. This is done to screen for osteoporosis. You may have this done starting at age 69.  Mammogram. This may be done every 1-2 years. Talk to your health care provider about how often you should have regular mammograms. Talk with your health care provider about your test results, treatment options, and if necessary, the need for more tests. Vaccines  Your health care provider may recommend certain vaccines, such as:  Influenza vaccine. This is recommended every year.  Tetanus, diphtheria,  and acellular pertussis (Tdap, Td) vaccine. You may need a Td booster every 10 years.  Zoster vaccine. You may need this after age 50.  Pneumococcal 13-valent conjugate (PCV13) vaccine. One dose is recommended after age 14.  Pneumococcal  polysaccharide (PPSV23) vaccine. One dose is recommended after age 1. Talk to your health care provider about which screenings and vaccines you need and how often you need them. This information is not intended to replace advice given to you by your health care provider. Make sure you discuss any questions you have with your health care provider. Document Released: 09/21/2015 Document Revised: 05/14/2016 Document Reviewed: 06/26/2015 Elsevier Interactive Patient Education  2017 [place] Prevention in the Home Falls can cause injuries. They can happen to people of all ages. There are many things you can do to make your home safe and to help prevent falls. What can I do on the outside of my home?  Regularly fix the edges of walkways and driveways and fix any cracks.  Remove anything that might make you trip as you walk through a door, such as a raised step or threshold.  Trim any bushes or trees on the path to your home.  Use bright outdoor lighting.  Clear any walking paths of anything that might make someone trip, such as rocks or tools.  Regularly check to see if handrails are loose or broken. Make sure that both sides of any steps have handrails.  Any raised decks and porches should have guardrails on the edges.  Have any leaves, snow, or ice cleared regularly.  Use sand or salt on walking paths during winter.  Clean up any spills in your garage right away. This includes oil or grease spills. What can I do in the bathroom?  Use night lights.  Install grab bars by the toilet and in the tub and shower. Do not use towel bars as grab bars.  Use non-skid mats or decals in the tub or shower.  If you need to sit down in the shower, use a plastic, non-slip stool.  Keep the floor dry. Clean up any water that spills on the floor as soon as it happens.  Remove soap buildup in the tub or shower regularly.  Attach bath mats securely with double-sided non-slip rug  tape.  Do not have throw rugs and other things on the floor that can make you trip. What can I do in the bedroom?  Use night lights.  Make sure that you have a light by your bed that is easy to reach.  Do not use any sheets or blankets that are too big for your bed. They should not hang down onto the floor.  Have a firm chair that has side arms. You can use this for support while you get dressed.  Do not have throw rugs and other things on the floor that can make you trip. What can I do in the kitchen?  Clean up any spills right away.  Avoid walking on wet floors.  Keep items that you use a lot in easy-to-reach places.  If you need to reach something above you, use a strong step stool that has a grab bar.  Keep electrical cords out of the way.  Do not use floor polish or wax that makes floors slippery. If you must use wax, use non-skid floor wax.  Do not have throw rugs and other things on the floor that can make you trip. What can I do with my  stairs?  Do not leave any items on the stairs.  Make sure that there are handrails on both sides of the stairs and use them. Fix handrails that are broken or loose. Make sure that handrails are as long as the stairways.  Check any carpeting to make sure that it is firmly attached to the stairs. Fix any carpet that is loose or worn.  Avoid having throw rugs at the top or bottom of the stairs. If you do have throw rugs, attach them to the floor with carpet tape.  Make sure that you have a light switch at the top of the stairs and the bottom of the stairs. If you do not have them, ask someone to add them for you. What else can I do to help prevent falls?  Wear shoes that:  Do not have high heels.  Have rubber bottoms.  Are comfortable and fit you well.  Are closed at the toe. Do not wear sandals.  If you use a stepladder:  Make sure that it is fully opened. Do not climb a closed stepladder.  Make sure that both sides of the  stepladder are locked into place.  Ask someone to hold it for you, if possible.  Clearly mark and make sure that you can see:  Any grab bars or handrails.  First and last steps.  Where the edge of each step is.  Use tools that help you move around (mobility aids) if they are needed. These include:  Canes.  Walkers.  Scooters.  Crutches.  Turn on the lights when you go into a dark area. Replace any light bulbs as soon as they burn out.  Set up your furniture so you have a clear path. Avoid moving your furniture around.  If any of your floors are uneven, fix them.  If there are any pets around you, be aware of where they are.  Review your medicines with your doctor. Some medicines can make you feel dizzy. This can increase your chance of falling. Ask your doctor what other things that you can do to help prevent falls. This information is not intended to replace advice given to you by your health care provider. Make sure you discuss any questions you have with your health care provider. Document Released: 06/21/2009 Document Revised: 01/31/2016 Document Reviewed: 09/29/2014 Elsevier Interactive Patient Education  2017 [employer].

## 2021-02-06 ENCOUNTER — Other Ambulatory Visit: Payer: Self-pay

## 2021-02-06 ENCOUNTER — Ambulatory Visit (INDEPENDENT_AMBULATORY_CARE_PROVIDER_SITE_OTHER): Payer: Medicare Other | Admitting: Adult Health

## 2021-02-06 VITALS — BP 140/82 | HR 74 | Temp 98.1°F | Ht 60.0 in | Wt 168.0 lb

## 2021-02-06 DIAGNOSIS — M5416 Radiculopathy, lumbar region: Secondary | ICD-10-CM | POA: Diagnosis not present

## 2021-02-06 NOTE — Progress Notes (Signed)
Subjective:    Patient ID: Ann Thompson, female    DOB: [birth year], 80 y.o.   MRN: [id number]  HPI 80 year old female who  has a past medical history of Arthritis, DM type 2 (diabetes mellitus, type 2) (HCC), GERD (gastroesophageal reflux disease), History of kidney stones, Hyperlipemia, Hypertension, Left leg pain, and Wears hearing aid in both ears.  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  Has been seen by orthopedics as well as neurosurgery.  Had low back surgery done in November 2021 and has had steroid epidurals done in the past.  Unfortunately, she continues to be in significant pain on a constant basis.  Pain is slightly relieved with prescribed Norco.  She would like to try physical therapy to see if she can get a better handle on her pain.   Review of Systems See HPI   Past Medical History:  Diagnosis Date  . Arthritis   . DM type 2 (diabetes mellitus, type 2) (HCC)   . GERD (gastroesophageal reflux disease)   . History of kidney stones   . Hyperlipemia   . Hypertension   . Left leg pain   . Wears hearing aid in both ears     Social History   Socioeconomic History  . Marital status: Married    Spouse name: Not on file  . Number of children: Not on file  . Years of education: Not on file  . Highest education level: Not on file  Occupational History  . Not on file  Tobacco Use  . Smoking status: Former Smoker    Packs/day: 1.00    Years: 25.00    Pack years: 25.00    Types: Cigarettes    Quit date: 09/08/1981    Years since quitting: 39.4  . Smokeless tobacco: Never Used  Vaping Use  . Vaping Use: Never used  Substance and Sexual Activity  . Alcohol use: Never  . Drug use: Never  . Sexual activity: Not on file  Other Topics Concern  . Not on file  Social History Narrative  . Not on file   Social Determinants of Health   Financial Resource Strain: Low Risk   . Difficulty of Paying Living Expenses: Not hard  at all  Food Insecurity: No Food Insecurity  . Worried About Programme researcher, broadcasting/film/video in the Last Year: Never true  . Ran Out of Food in the Last Year: Never true  Transportation Needs: No Transportation Needs  . Lack of Transportation (Medical): No  . Lack of Transportation (Non-Medical): No  Physical Activity: Inactive  . Days of Exercise per Week: 0 days  . Minutes of Exercise per Session: 0 min  Stress: No Stress Concern Present  . Feeling of Stress : Not at all  Social Connections: Moderately Isolated  . Frequency of Communication with Friends and Family: Twice a week  . Frequency of Social Gatherings with Friends and Family: Twice a week  . Attends Religious Services: Never  . Active Member of Clubs or Organizations: No  . Attends Banker Meetings: Never  . Marital Status: Married  Catering manager Violence: Not At Risk  . Fear of Current or Ex-Partner: No  . Emotionally Abused: No  . Physically Abused: No  . Sexually Abused: No    Past Surgical History:  Procedure Laterality Date  . ABDOMINAL HYSTERECTOMY  1982   partial  . APPENDECTOMY  1957  . BACK SURGERY  07/12/2020   lower at [facility]  . CYSTOSCOPY W/ URETERAL STENT PLACEMENT Right 10/06/2020   Procedure: CYSTOSCOPY WITH RETROGRADE PYELOGRAM/URETERAL STENT PLACEMENT;  Surgeon: Sebastian Ache, MD;  [facility];  Service: Urology;  Laterality: Right;  . CYSTOSCOPY WITH RETROGRADE PYELOGRAM, URETEROSCOPY AND STENT PLACEMENT Right 10/26/2020   Procedure: CYSTOSCOPY WITH RETROGRADE PYELOGRAM, URETEROSCOPY AND STENT REPLACEMENT;  Surgeon: Sebastian Ache, MD;  Location[facility];  Service: Urology;  Laterality: Right;  90 MINS  . HOLMIUM LASER APPLICATION Right 10/26/2020   Procedure: HOLMIUM LASER APPLICATION;  Surgeon: Sebastian Ache, MD;  Location: [facility];  Service: Urology;  Laterality: Right;  . JOINT REPLACEMENT  2016   both knees partial replacement     Family History  Family history unknown: Yes    No Known Allergies  Current Outpatient Medications on File Prior to Visit  Medication Sig Dispense Refill  . acetaminophen (TYLENOL) 500 MG tablet Take 1,000 mg by mouth every 6 (six) hours as needed.    . bisoprolol-hydrochlorothiazide (ZIAC) 2.5-6.25 MG tablet Take 1 tablet by mouth daily. 90 tablet 3  . fluticasone (FLONASE) 50 MCG/ACT nasal spray Place 2 sprays into both nostrils daily. 16 g 6  . Glucosamine-Chondroit-Vit C-Mn (GLUCOSAMINE 1500 COMPLEX) CAPS Take 1 capsule by mouth daily.    Marland Kitchen MAGNESIUM-POTASSIUM PO Take 1 tablet by mouth daily.    . Multiple Vitamin (MULTIVITAMIN) tablet Take 1 tablet by mouth daily.    . pramipexole (MIRAPEX) 1.5 MG tablet Take 1 tablet (1.5 mg total) by mouth at bedtime. 90 tablet 1  . sitaGLIPtin (JANUVIA) 25 MG tablet Take 1 tablet (25 mg total) by mouth daily. 90 tablet 1  . Tetrahydrozoline-Zn Sulfate (EYE DROPS A/C OP) Apply 1 drop to eye daily as needed (dry eyes).    . triamterene-hydrochlorothiazide (MAXZIDE-25) 37.5-25 MG tablet TAKE 1 TABLET BY MOUTH EVERY DAY 90 tablet 2  . UNABLE TO FIND Med Name: cbd oil to knees prn    . HYDROcodone-acetaminophen (NORCO/VICODIN) 5-325 MG tablet Take 1 tablet by mouth every 6 (six) hours as needed for moderate pain or severe pain. Post-operatively 10 tablet 0   No current facility-administered medications on file prior to visit.    BP 140/82   Pulse 74   Temp 98.1 F (36.7 C) (Oral)   Ht 5' (1.524 m)   Wt 168 lb (76.2 kg)   SpO2 96%   BMI 32.81 kg/m       Objective:   Physical Exam Vitals and nursing note reviewed.  Constitutional:      Appearance: Normal appearance.  Cardiovascular:     Rate and Rhythm: Normal rate and regular rhythm.     Pulses: Normal pulses.     Heart sounds: Normal heart sounds.  Pulmonary:     Effort: Pulmonary effort is normal.  Skin:    General: Skin is warm and dry.     Capillary Refill: Capillary refill  takes less than 2 seconds.  Neurological:     General: No focal deficit present.     Mental Status: She is alert and oriented to person, place, and time.     Gait: Gait abnormal (walks with cane. Has difficulty getting out of chair).  Psychiatric:        Mood and Affect: Mood normal.        Behavior: Behavior normal.        Thought Content: Thought content normal.        Judgment:  Judgment normal.       Assessment & Plan:  1. Lumbar radiculopathy - Ambulatory referral to Physical Therapy  [name], NP

## 2021-02-08 ENCOUNTER — Other Ambulatory Visit: Payer: Medicare Other

## 2021-02-11 ENCOUNTER — Encounter: Payer: Self-pay | Admitting: Adult Health

## 2021-02-12 NOTE — Telephone Encounter (Signed)
Please advise if you know any dentist that you would recommend.

## 2021-02-13 ENCOUNTER — Telehealth (INDEPENDENT_AMBULATORY_CARE_PROVIDER_SITE_OTHER): Payer: Medicare Other | Admitting: Family Medicine

## 2021-02-13 ENCOUNTER — Other Ambulatory Visit: Payer: Self-pay

## 2021-02-13 ENCOUNTER — Ambulatory Visit: Payer: Medicare Other | Admitting: Physical Therapy

## 2021-02-13 ENCOUNTER — Encounter: Payer: Self-pay | Admitting: Family Medicine

## 2021-02-13 ENCOUNTER — Telehealth: Payer: Self-pay | Admitting: Adult Health

## 2021-02-13 DIAGNOSIS — U071 COVID-19: Secondary | ICD-10-CM | POA: Diagnosis not present

## 2021-02-13 MED ORDER — BENZONATATE 200 MG PO CAPS
200.0000 mg | ORAL_CAPSULE | Freq: Four times a day (QID) | ORAL | 0 refills | Status: DC | PRN
Start: 2021-02-13 — End: 2021-05-28

## 2021-02-13 MED ORDER — MOLNUPIRAVIR EUA 200MG CAPSULE: 4.0000 | ORAL_CAPSULE | Freq: Two times a day (BID) | ORAL | 0 refills | Status: AC

## 2021-02-13 NOTE — Telephone Encounter (Signed)
This has been taking care of. Pt has been scheduled!

## 2021-02-13 NOTE — Telephone Encounter (Signed)
Pt husband call and stated she tested positive for Covid and want a call back to tell her what to do. The # is [phone].

## 2021-02-13 NOTE — Progress Notes (Signed)
   Subjective:    Patient ID: Ann Thompson, female    DOB: 01/27/1941, 80 y.o.   MRN: [id number]  HPI Virtual Visit via Telephone Note  I connected with the patient on 02/13/21 at 10:00 AM EDT by telephone and verified that I am speaking with the correct person using two identifiers.   I discussed the limitations, risks, security and privacy concerns of performing an evaluation and management service by telephone and the availability of in person appointments. I also discussed with the patient that there may be a patient responsible charge related to this service. The patient expressed understanding and agreed to proceed.  Location patient: home Location provider: work or home office Participants present for the call: patient, provider Patient did not have a visit in the prior 7 days to address this/these issue(s).   History of Present Illness: Here for a Covid-19 infection. Last week she was visiting her son and grandson in [place], and she began coughing 3 days ago. Her ribs hurt from coughing, but it is dry and she has no SOB. No fever or NVD. Drinking fluids and taking Mucinex. She tested positive for the Covid-19 virus yesterday.    Observations/Objective: Patient sounds cheerful and well on the phone. I do not appreciate any SOB. Speech and thought processing are grossly intact. Patient reported vitals:  Assessment and Plan: Covid-19 infection. We will treat this with 5 days of Molnupiravir. Use Benzonatate as needed for coughing.  Gershon Crane, MD   Follow Up Instructions:     [phone] [phone] 11-20 [phone] 21-30 I did not refer this patient for an OV in the next 24 hours for this/these issue(s).  I discussed the assessment and treatment plan with the patient. The patient was provided an opportunity to ask questions and all were answered. The patient agreed with the plan and demonstrated an understanding of the instructions.   The patient was advised to call back  or seek an in-person evaluation if the symptoms worsen or if the condition fails to improve as anticipated.  I provided 17 minutes of non-face-to-face time during this encounter.   Gershon Crane, MD    Review of Systems     Objective:   Physical Exam        Assessment & Plan:

## 2021-02-22 ENCOUNTER — Ambulatory Visit: Payer: Medicare Other | Admitting: Physical Therapy

## 2021-02-27 ENCOUNTER — Other Ambulatory Visit: Payer: Self-pay

## 2021-02-27 ENCOUNTER — Encounter: Payer: Self-pay | Admitting: Physical Therapy

## 2021-02-27 ENCOUNTER — Ambulatory Visit (INDEPENDENT_AMBULATORY_CARE_PROVIDER_SITE_OTHER): Payer: Medicare Other | Admitting: Physical Therapy

## 2021-02-27 DIAGNOSIS — M545 Low back pain, unspecified: Secondary | ICD-10-CM

## 2021-02-27 DIAGNOSIS — R262 Difficulty in walking, not elsewhere classified: Secondary | ICD-10-CM | POA: Diagnosis not present

## 2021-02-27 DIAGNOSIS — M6281 Muscle weakness (generalized): Secondary | ICD-10-CM

## 2021-02-27 NOTE — Patient Instructions (Signed)
Access Code: [email or web address] URL: [email or web address] Date: 02/27/2021 Prepared by: Zebedee Iba  Exercises Supine Posterior Pelvic Tilt - 2 x daily - 7 x weekly - 3 sets - 10 reps Supine Lower Trunk Rotation - 2 x daily - 7 x weekly - 2 sets - 10 reps - 3 hold Seated Quadratus Lumborum Stretch with Arm Overhead - 2 x daily - 7 x weekly - 1 sets - 3 reps - 30 hold

## 2021-02-27 NOTE — Therapy (Signed)
[facility] [address] [address] [place], [place], [address] Phone: [phone]   Fax:  [phone]  Physical Therapy Evaluation  Patient Details  Name: Ann NickelBarbara Thompson MRN: [id number] Date of Birth: 08-30-1941 Referring Provider (PT): Ann Freesory Nafziger, NP   Encounter Date: 02/27/2021   PT End of Session - 02/27/21 1654     Visit Number 1    Number of Visits 17    Date for PT Re-Evaluation 05/28/21    Authorization Type Medicare    PT Start Time 1515    PT Stop Time 1615    PT Time Calculation (min) 60 min    Activity Tolerance Patient limited by pain    Behavior [facility] for tasks assessed/performed             Past Medical History:  Diagnosis Date   Arthritis    DM type 2 (diabetes mellitus, type 2) (HCC)    GERD (gastroesophageal reflux disease)    History of kidney stones    Hyperlipemia    Hypertension    Left leg pain    Wears hearing aid in both ears     Past Surgical History:  Procedure Laterality Date   ABDOMINAL HYSTERECTOMY  1982   partial   APPENDECTOMY  1957   BACK SURGERY  07/12/2020   lower at [facility]   CYSTOSCOPY W/ URETERAL STENT PLACEMENT Right 10/06/2020   Procedure: CYSTOSCOPY WITH RETROGRADE PYELOGRAM/URETERAL STENT PLACEMENT;  Surgeon: Sebastian AcheManny, Theodore, MD;  [facility];  Service: Urology;  Laterality: Right;   CYSTOSCOPY WITH RETROGRADE PYELOGRAM, URETEROSCOPY AND STENT PLACEMENT Right 10/26/2020   Procedure: CYSTOSCOPY WITH RETROGRADE PYELOGRAM, URETEROSCOPY AND STENT REPLACEMENT;  Surgeon: Sebastian AcheManny, Theodore, MD;  [facility];  Service: Urology;  Laterality: Right;  90 MINS   HOLMIUM LASER APPLICATION Right 10/26/2020   Procedure: HOLMIUM LASER APPLICATION;  Surgeon: Sebastian AcheManny, Theodore, MD;  [facility];  Service: Urology;  Laterality: Right;   JOINT REPLACEMENT  2016   both knees partial replacement    There were no vitals filed for  this visit.    Subjective Assessment - 02/27/21 1523     Subjective Pt states the pain has been going on for 6 months-1 year at this point. Pt has LBP and L LE pain. She has had continued pain since her surgery. The pain was so excruciating that movement/exercise was too unbearable. Pt states the pain is all the way down to the ankle along the outer thigh/leg. Pt states that it might be SIJ related pain. Pt states the pain is stabbing but denies NT. Pt has most pain the morning. Pt denies trouble sleeping bc of pain.Aggs: waking up, bending, squatting, lifting(<5lbs); Eases: laying down/resting, leaning on shopping. Pt denies coughing, sneezing, laughing pain. Current 3/10, Best 3/10, Worst 10/10. Pt states the L leg will feel like it gives out on occasion. Pt denies BB function changes. Pt denies saddle anesthesia. Pt states she is fairly sendatary throughout the day. Pt does not currently exercise. Pt denies cancer red flags.    Patient is accompained by: Family member   Daughter- Ann Thompson   Pertinent History Surgery for herniated disc- no relief; injections; nerve block; restless leg syndrome    How long can you sit comfortably? 10-15 mins    How long can you stand comfortably? 15mins    How long can you walk comfortably? 20-30 mins    Patient Stated Goals Pt states she likes to  golf and would love to golf.    Pain Onset Other (comment)                [facility] PT Assessment - 02/27/21 1600       Assessment   Medical Diagnosis M54.16 (ICD-10-CM) - Lumbar radiculopathy    Referring Provider (PT) Ann Frees, NP    Prior Therapy N/A      Precautions   Precautions None      Restrictions   Weight Bearing Restrictions No      Balance Screen   Has the patient fallen in the past 6 months No    Has the patient had a decrease in activity level because of a fear of falling?  No    Is the patient reluctant to leave their home because of a fear of falling?  No      Home Curator residence      Prior Function   Level of Independence Independent      Cognition   Overall Cognitive Status Within Functional Limits for tasks assessed      Observation/Other Assessments   Oswestry Index 21/50 42%      Sensation   Light Touch Appears Intact      Functional Tests   Functional tests Squat;Sit to Stand      Squat   Comments unable to test due to pain      Sit to Stand   Comments increased time, decreased hip extension, quad dominant, guarded with upright back position      Posture/Postural Control   Posture/Postural Control Postural limitations    Postural Limitations Decreased lumbar lordosis;Weight shift right;Flexed trunk      Deep Tendon Reflexes   DTR Assessment Site Achilles    Achilles DTR 2+   L3-4 2+     AROM   Overall AROM Comments L/S motion limited >50% in all directions, increased pain with flexion into hip and lateral thigh      PROM   Overall PROM Comments untested due to increased pain      Strength   Overall Strength Comments 4/5 on R hip and knee; 4-/5 L hip and knee through- significant fatiguing weakness      Flexibility   Soft Tissue Assessment /Muscle Length yes    Hamstrings signficantly limited with supine 90/90    Piriformis sig limited L hip IR ROM    Quadratus Lumborum sig limited on L      Palpation   Spinal mobility decreased L1-5 extension glide with CPA S/L    SI assessment  TTP along R SIJ    Palpation comment TTP anterior, post, and lateral thigh and hip of L LE      Special Tests    Special Tests Lumbar;Sacrolliac Tests    Lumbar Tests Slump Test;Prone Knee Bend Test;Straight Leg Raise    Sacroiliac Tests  --   Untested due to significant increased in pain with transfers     Slump test   Findings Positive      Straight Leg Raise   Findings Positive      Bed Mobility   Bed Mobility Rolling Right;Rolling Left;Supine to Sit;Sit to Supine    Rolling Right Other (comment)   painful    Rolling Left Other (comment)   painful, improved with log roll   Supine to Sit Minimal Assistance - Patient > 75%    Sit to Supine Independent      Transfers  Five time sit to stand comments  27.2 s      Ambulation/Gait   Ambulation/Gait Yes    Ambulation/Gait Assistance 6: Modified independent (Device/Increase time)    Ambulation Distance (Feet) 25 Feet    Assistive device Straight cane    Gait Pattern Decreased stance time - left;Decreased step length - right;Trendelenburg;Antalgic;Lateral trunk lean to right;Trunk flexed;Narrow base of support    Stairs --   unable to perform                       Objective measurements completed on examination: See above findings.       [facility] Adult PT Treatment/Exercise - 02/27/21 1600       Exercises   Exercises Lumbar      Lumbar Exercises: Stretches   Lower Trunk Rotation Limitations 15x 3s hold to R only    Other Lumbar Stretch Exercise seated QL stretch 30s 3x      Lumbar Exercises: Supine   Pelvic Tilt Limitations 20x                    PT Education - 02/27/21 1653     Education Details MOI, diagnosis, prognosis, anatomy, exercise progression, DOMS expectations, muscle firing, HEP, joint protection, postural changes, POC    Person(s) Educated Patient;Child(ren)    Methods Explanation;Demonstration;Tactile cues;Verbal cues;Handout    Comprehension Verbalized understanding;Returned demonstration;Verbal cues required;Tactile cues required              PT Short Term Goals - 02/27/21 1700       PT SHORT TERM GOAL #1   Title Pt will become independent with HEP in order to demonstrate synthesis of PT education.    Time 4    Period Weeks    Status New      PT SHORT TERM GOAL #2   Title Pt will be able to demonstrate gait without AD in order to demonstrate functional improvement in LE function for self-care and house hold duties.    Time 4    Period Weeks    Status New      PT SHORT TERM GOAL  #3   Title Pt will be able to demonstrate ability to bend and pick up from floor without pain in order to demonstrate functional improvement in lumbopelvic function for self-care and house hold duties.    Time 4    Period Weeks    Status New               PT Long Term Goals - 02/27/21 1703       PT LONG TERM GOAL #1   Title Pt will become independent with final HEP in order to demonstrate synthesis of PT education.    Baseline 8    Period Weeks    Status New      PT LONG TERM GOAL #2   Title Pt will demonstrate at least a 12.8 improvement in Oswestry Index in order to demonstrate a clinically significant change in LBP and function.    Time 8    Period Weeks      PT LONG TERM GOAL #3   Title Pt will be able to perform 5XSTS in under 12  in order to demonstrate functional improvement above the cut off score for older adults.    Time 8    Period Weeks    Status New      PT LONG TERM GOAL #4   Title Pt will  demonstrate/report ability to stand/sit/sleep without pain in order to demonstrate functional improvement and tolerance to static positioning.    Time 8    Period Weeks    Status New                    Plan - 02/27/21 1654     Clinical Impression Statement Pt is an 80 y.o. female presenting to PT eval today for CC of LBP and L LE pain. Pt presents with signficant L/S and hip ROM restrictions, pain limited movement, gait deviation, and L LE weakness. Pt's s/s appear consistent with lumbar radiculopathy with SIJ irritation. Pt's symptoms are highly irritable and sensitive. Of concern is pt's myotomal, fatiguing weakness in L LE. Pt is currently at risk for falls due to significant pain and weakness as demonstrated by functional and pt reported outcome measure. Pt's impairments limit participation with ADL, community ambulation, and recreation. Pt would benefit from continued skilled therapy in order to reach goals and maximize functional lumobpelvic strength and ROM  for full return to PLOF.    Personal Factors and Comorbidities Age;Time since onset of injury/illness/exacerbation;Comorbidity 1;Comorbidity 2;Comorbidity 3+;Fitness    Examination-Activity Limitations Bathing;Locomotion Level;Transfers;Bend;Sit;Sleep;Carry;Squat;Stairs;Stand;Lift    Examination-Participation Restrictions Yard Work;Occupation;Community Activity;Driving    Stability/Clinical Decision Making Evolving/Moderate complexity    Clinical Decision Making Moderate    Rehab Potential Fair    PT Frequency 2x / week    PT Duration 8 weeks    PT Treatment/Interventions ADLs/Self Care Home Management;Aquatic Therapy;Cryotherapy;Electrical Stimulation;Iontophoresis 4mg /ml Dexamethasone;Moist Heat;Traction;Ultrasound;DME Instruction;Gait training;Stair training;Functional mobility training;Therapeutic activities;Therapeutic exercise;Balance training;Neuromuscular re-education;Patient/family education;Orthotic Fit/Training;Manual techniques;Scar mobilization;Passive range of motion;Dry needling;Taping;Vasopneumatic Device;Spinal Manipulations;Joint Manipulations    PT Next Visit Plan review HEP, ab brace with march, SKTC, piriformis stretch    PT Home Exercise Plan Access Code: [email or web address]    Consulted and Agree with Plan of Care Patient;Family member/caregiver             Patient will benefit from skilled therapeutic intervention in order to improve the following deficits and impairments:  Abnormal gait, Pain, Improper body mechanics, Decreased mobility, Increased muscle spasms, Decreased activity tolerance, Decreased endurance, Decreased range of motion, Decreased strength  Visit Diagnosis: Pain, lumbar region  Muscle weakness (generalized)  Difficulty walking     Problem List Patient Active Problem List   Diagnosis Date Noted   COVID-19 virus infection 02/13/2021   Ureteral stone 10/06/2020   Ureteral stone with hydronephrosis 10/06/2020   Hypertension    10/08/2020 PT,  DPT 02/27/21 5:05 PM   [place] [facility] [address] [place], [place], [place] Phone: [phone]   Fax:  [phone]  Name: Ann Thompson MRN: Ann Thompson Date of Birth: 1941/02/03

## 2021-03-04 ENCOUNTER — Encounter: Payer: Medicare Other | Admitting: Physical Therapy

## 2021-03-06 ENCOUNTER — Other Ambulatory Visit: Payer: Self-pay

## 2021-03-06 ENCOUNTER — Encounter: Payer: Self-pay | Admitting: Physical Therapy

## 2021-03-06 ENCOUNTER — Ambulatory Visit (INDEPENDENT_AMBULATORY_CARE_PROVIDER_SITE_OTHER): Payer: Medicare Other | Admitting: Physical Therapy

## 2021-03-06 DIAGNOSIS — M6281 Muscle weakness (generalized): Secondary | ICD-10-CM | POA: Diagnosis not present

## 2021-03-06 DIAGNOSIS — M545 Low back pain, unspecified: Secondary | ICD-10-CM | POA: Diagnosis not present

## 2021-03-06 DIAGNOSIS — R262 Difficulty in walking, not elsewhere classified: Secondary | ICD-10-CM | POA: Diagnosis not present

## 2021-03-06 NOTE — Therapy (Signed)
[facility] [address] [place], [place], [address] Phone: [phone]   Fax:  [phone]  Physical Therapy Treatment  Patient Details  Name: Ann Thompson MRN: [id number] Date of Birth: Sep 28, 1940 Referring Provider (PT): [name], NP   Encounter Date: 03/06/2021   PT End of Session - 03/06/21 1642     Visit Number 2    Number of Visits 17    Date for PT Re-Evaluation 05/28/21    Authorization Type Medicare    PT Start Time 1437    PT Stop Time 1516    PT Time Calculation (min) 39 min    Activity Tolerance Patient limited by pain    Behavior During Therapy [facility] for tasks assessed/performed             Past Medical History:  Diagnosis Date   Arthritis    DM type 2 (diabetes mellitus, type 2) (HCC)    GERD (gastroesophageal reflux disease)    History of kidney stones    Hyperlipemia    Hypertension    Left leg pain    Wears hearing aid in both ears     Past Surgical History:  Procedure Laterality Date   ABDOMINAL HYSTERECTOMY  1982   partial   APPENDECTOMY  1957   BACK SURGERY  07/12/2020   lower at [facility]   CYSTOSCOPY W/ URETERAL STENT PLACEMENT Right 10/06/2020   Procedure: CYSTOSCOPY WITH RETROGRADE PYELOGRAM/URETERAL STENT PLACEMENT;  Surgeon: Sebastian Ache, MD;  [facility];  Service: Urology;  Laterality: Right;   CYSTOSCOPY WITH RETROGRADE PYELOGRAM, URETEROSCOPY AND STENT PLACEMENT Right 10/26/2020   Procedure: CYSTOSCOPY WITH RETROGRADE PYELOGRAM, URETEROSCOPY AND STENT REPLACEMENT;  Surgeon: Sebastian Ache, MD;  Location: [facility];  Service: Urology;  Laterality: Right;  90 MINS   HOLMIUM LASER APPLICATION Right 10/26/2020   Procedure: HOLMIUM LASER APPLICATION;  Surgeon: Sebastian Ache, MD;  Location: [facility];  Service: Urology;  Laterality: Right;   JOINT REPLACEMENT  2016   both knees partial replacement    There were no vitals filed for  this visit.   Subjective Assessment - 03/06/21 1434     Subjective Pt states she is not having extra pain today. Getting up in the mornings is still the most painful. She reports compliance with HEP.    Patient is accompained by: Family member   Daughter- Boyd Kerbs   Pertinent History Surgery for herniated disc- no relief; injections; nerve block; restless leg syndrome    How long can you sit comfortably? 10-15 mins    How long can you stand comfortably?    How long can you walk comfortably? 20-30 mins    Patient Stated Goals Pt states she likes to golf and would love to golf.    Currently in Pain? Yes    Pain Score 1     Pain Orientation Left    Pain Descriptors / Indicators Constant    Pain Onset Other (comment)                               [facility] Adult PT Treatment/Exercise - 03/06/21 0001       Bed Mobility   Bed Mobility Rolling Right;Rolling Left;Supine to Sit;Sit to Supine    Rolling Right Other (comment)   painful   Rolling Left Other (comment)   painful, improved with log roll   Supine to Sit Independent with assistive  device    Sit to Supine Independent      Transfers   Five time sit to stand comments  --      Ambulation/Gait   Ambulation/Gait --    Ambulation/Gait Assistance --    Ambulation Distance (Feet) --    Assistive device --    Gait Pattern --    Stairs --      Posture/Postural Control   Posture/Postural Control Postural limitations    Postural Limitations Decreased lumbar lordosis;Weight shift right;Flexed trunk      Exercises   Exercises Lumbar      Lumbar Exercises: Stretches   Single Knee to Chest Stretch Limitations 30s 3x    Lower Trunk Rotation Limitations 15x 3s hold to R only    Figure 4 Stretch Limitations 30s 2x no OP    Other Lumbar Stretch Exercise seated QL stretch 30s 3x      Lumbar Exercises: Supine   Pelvic Tilt Limitations 20x    Bridge 20 reps    Bridge Limitations 2x10 cued for alignment   YTB at  knees     Manual Therapy   Manual Therapy Joint mobilization;Soft tissue mobilization    Joint Mobilization L1-3 CPA grade I- II pain mgmt    Soft tissue mobilization L lateral hip and L/S paraspinals                    PT Education - 03/06/21 1650     Education Details exercise progression, DOMS expectations, muscle firing, HEP, increasing daily movement variation, pain neuroscience, postural changes, POC    Person(s) Educated Patient;Child(ren)    Methods Explanation;Demonstration;Tactile cues;Handout;Verbal cues    Comprehension Verbalized understanding;Returned demonstration;Verbal cues required;Tactile cues required              PT Short Term Goals - 02/27/21 1700       PT SHORT TERM GOAL #1   Title Pt will become independent with HEP in order to demonstrate synthesis of PT education.    Time 4    Period Weeks    Status New      PT SHORT TERM GOAL #2   Title Pt will be able to demonstrate gait without AD in order to demonstrate functional improvement in LE function for self-care and house hold duties.    Time 4    Period Weeks    Status New      PT SHORT TERM GOAL #3   Title Pt will be able to demonstrate ability to bend and pick up from floor without pain in order to demonstrate functional improvement in lumbopelvic function for self-care and house hold duties.    Time 4    Period Weeks    Status New               PT Long Term Goals - 02/27/21 1703       PT LONG TERM GOAL #1   Title Pt will become independent with final HEP in order to demonstrate synthesis of PT education.    Baseline 8    Period Weeks    Status New      PT LONG TERM GOAL #2   Title Pt will demonstrate at least a 12.8 improvement in Oswestry Index in order to demonstrate a clinically significant change in LBP and function.    Time 8    Period Weeks      PT LONG TERM GOAL #3   Title Pt will be able to perform 5XSTS  in under 12  in order to demonstrate functional  improvement above the cut off score for older adults.    Time 8    Period Weeks    Status New      PT LONG TERM GOAL #4   Title Pt will demonstrate/report ability to stand/sit/sleep without pain in order to demonstrate functional improvement and tolerance to static positioning.    Time 8    Period Weeks    Status New                   Plan - 03/06/21 1644     Clinical Impression Statement Pt with improved tolerance for ROM at today's session and able to progress hip and L/S stretching exercise without increased pain. Pt with increased sensitivity to touch along L lateral hip and L/S. Pt able to perform bridging today but required increased VC and TC for pelvic rotation as well as diaphragmatic breathing. Pt with lumbopelvic weakness and motor control deficits, esp on L LE that will be progressed as tolerated. Pt given extensive edu about increasing generaly daily physical activity to reduce pain and movement sensitivity in early mornings. Pt would benefit from continued skilled therapy in order to reach goals and maximize functional lumobpelvic strength and ROM for full return to PLOF.    Personal Factors and Comorbidities Age;Time since onset of injury/illness/exacerbation;Comorbidity 1;Comorbidity 2;Comorbidity 3+;Fitness    Examination-Activity Limitations Bathing;Locomotion Level;Transfers;Bend;Sit;Sleep;Carry;Squat;Stairs;Stand;Lift    Examination-Participation Restrictions Yard Work;Occupation;Community Activity;Driving    Stability/Clinical Decision Making Evolving/Moderate complexity    Rehab Potential Fair    PT Frequency 2x / week    PT Duration 8 weeks    PT Treatment/Interventions ADLs/Self Care Home Management;Aquatic Therapy;Cryotherapy;Electrical Stimulation;Iontophoresis 4mg /ml Dexamethasone;Moist Heat;Traction;Ultrasound;DME Instruction;Gait training;Stair training;Functional mobility training;Therapeutic activities;Therapeutic exercise;Balance training;Neuromuscular  re-education;Patient/family education;Orthotic Fit/Training;Manual techniques;Scar mobilization;Passive range of motion;Dry needling;Taping;Vasopneumatic Device;Spinal Manipulations;Joint Manipulations    PT Next Visit Plan review HEP, SLR L, clamshell, STS    PT Home Exercise Plan Access Code: [email or web address]    Consulted and Agree with Plan of Care Patient;Family member/caregiver             Patient will benefit from skilled therapeutic intervention in order to improve the following deficits and impairments:  Abnormal gait, Pain, Improper body mechanics, Decreased mobility, Increased muscle spasms, Decreased activity tolerance, Decreased endurance, Decreased range of motion, Decreased strength  Visit Diagnosis: Pain, lumbar region  Muscle weakness (generalized)  Difficulty walking     Problem List Patient Active Problem List   Diagnosis Date Noted   COVID-19 virus infection 02/13/2021   Ureteral stone 10/06/2020   Ureteral stone with hydronephrosis 10/06/2020   Hypertension    10/08/2020 PT, DPT 03/06/21 4:50 PM   [facility] [address] [place], [place], [place] Phone: [phone]   Fax:  [phone]  Name: Ann Thompson MRN: [name] Date of Birth: 1940-10-07

## 2021-03-06 NOTE — Patient Instructions (Signed)
Access Code: [email or web address] URL: [email or web address] Date: 03/06/2021 Prepared by: Zebedee Iba  Exercises Supine Posterior Pelvic Tilt - 2 x daily - 7 x weekly - 3 sets - 10 reps Supine Lower Trunk Rotation - 2 x daily - 7 x weekly - 2 sets - 10 reps - 3 hold Seated Quadratus Lumborum Stretch with Arm Overhead - 2 x daily - 7 x weekly - 1 sets - 3 reps - 30 hold Supine Single Knee to Chest Stretch - 2 x daily - 7 x weekly - 1 sets - 3 reps - 30 hold

## 2021-03-08 ENCOUNTER — Other Ambulatory Visit: Payer: Self-pay

## 2021-03-08 ENCOUNTER — Ambulatory Visit (INDEPENDENT_AMBULATORY_CARE_PROVIDER_SITE_OTHER): Payer: Medicare Other | Admitting: Physical Therapy

## 2021-03-08 DIAGNOSIS — R262 Difficulty in walking, not elsewhere classified: Secondary | ICD-10-CM | POA: Diagnosis not present

## 2021-03-08 DIAGNOSIS — M545 Low back pain, unspecified: Secondary | ICD-10-CM | POA: Diagnosis not present

## 2021-03-08 DIAGNOSIS — M6281 Muscle weakness (generalized): Secondary | ICD-10-CM | POA: Diagnosis not present

## 2021-03-08 NOTE — Therapy (Signed)
[facility] [address] [place], [place], [address] Phone: [phone]   Fax:  [phone]  Physical Therapy Treatment  Patient Details  Name: Ann Thompson MRN: [id number] Date of Birth: 1941-08-07 Referring Provider (PT): [name], NP   Encounter Date: 03/08/2021   PT End of Session - 03/08/21 1037     Visit Number 3    Number of Visits 17    Date for PT Re-Evaluation 05/28/21    Authorization Type Medicare    PT Start Time 1017    PT Stop Time 1100    PT Time Calculation (min) 43 min    Activity Tolerance Patient limited by pain    Behavior During Therapy [facility] for tasks assessed/performed             Past Medical History:  Diagnosis Date   Arthritis    DM type 2 (diabetes mellitus, type 2) (HCC)    GERD (gastroesophageal reflux disease)    History of kidney stones    Hyperlipemia    Hypertension    Left leg pain    Wears hearing aid in both ears     Past Surgical History:  Procedure Laterality Date   ABDOMINAL HYSTERECTOMY  1982   partial   APPENDECTOMY  1957   BACK SURGERY  07/12/2020   lower at [facility]   CYSTOSCOPY W/ URETERAL STENT PLACEMENT Right 10/06/2020   Procedure: CYSTOSCOPY WITH RETROGRADE PYELOGRAM/URETERAL STENT PLACEMENT;  Surgeon: Sebastian Ache, MD;  Location: [facility] OR;  Service: Urology;  Laterality: Right;   CYSTOSCOPY WITH RETROGRADE PYELOGRAM, URETEROSCOPY AND STENT PLACEMENT Right 10/26/2020   Procedure: CYSTOSCOPY WITH RETROGRADE PYELOGRAM, URETEROSCOPY AND STENT REPLACEMENT;  Surgeon: Sebastian Ache, MD;  Location: [facility];  Service: Urology;  Laterality: Right;  90 MINS   HOLMIUM LASER APPLICATION Right 10/26/2020   Procedure: HOLMIUM LASER APPLICATION;  Surgeon: Sebastian Ache, MD;  Location: [facility];  Service: Urology;  Laterality: Right;   JOINT REPLACEMENT  2016   both knees partial replacement    There were no vitals filed for  this visit.   Subjective Assessment - 03/08/21 1018     Subjective Pt states that the pain is better. She has had less pain into the leg.        Pertinent History Surgery for herniated disc- no relief; injections; nerve block; restless leg syndrome    How long can you sit comfortably? 10-15 mins    How long can you stand comfortably?    How long can you walk comfortably? 20-30 mins    Patient Stated Goals Pt states she likes to golf and would love to golf.    Currently in Pain? Yes    Pain Score 3     Pain Location Hip    Pain Orientation Left    Pain Descriptors / Indicators Aching;Sore    Pain Onset Other (comment)                               [facility] Adult PT Treatment/Exercise - 03/08/21 0001       Bed Mobility   Bed Mobility Rolling Right;Rolling Left;Supine to Sit;Sit to Supine    Rolling Right Other (comment)   painful   Rolling Left Other (comment)   painful, improved with log roll   Supine to Sit Independent with assistive device    Sit to Supine Independent  Posture/Postural Control   Posture/Postural Control Postural limitations    Postural Limitations Decreased lumbar lordosis;Weight shift right;Flexed trunk      Exercises   Exercises Lumbar      Lumbar Exercises: Stretches   Single Knee to Chest Stretch Limitations 30s 3x    Lower Trunk Rotation Limitations 15x 3s hold to R only    Figure 4 Stretch Limitations 30s 2x no OP    Other Lumbar Stretch Exercise seated ball flexion stretch 10x 5s hold  3 way     Lumbar Exercises: Supine   Pelvic Tilt Limitations 20x    Bridge 20 reps    Bridge Limitations 2x10 cued for alignment   YTB at knees     Lumbar Exercises: Sidelying   Clam 20 reps    Clam Limitations YTB   2x10     Manual Therapy   Manual Therapy Joint mobilization;Soft tissue mobilization    Joint Mobilization L1-3 CPA grade I- II pain mgmt    Soft tissue mobilization L lateral hip and L/S paraspinals                     PT Education - 03/08/21 1036     Education Details DOMS expectations, muscle firing, HEP, increasing daily movement variation, pain neuroscience, postural changes, envelope of function    Person(s) Educated Patient    Methods Explanation;Demonstration;Tactile cues;Verbal cues    Comprehension Verbalized understanding;Returned demonstration;Verbal cues required;Tactile cues required              PT Short Term Goals - 02/27/21 1700       PT SHORT TERM GOAL #1   Title Pt will become independent with HEP in order to demonstrate synthesis of PT education.    Time 4    Period Weeks    Status New      PT SHORT TERM GOAL #2   Title Pt will be able to demonstrate gait without AD in order to demonstrate functional improvement in LE function for self-care and house hold duties.    Time 4    Period Weeks    Status New      PT SHORT TERM GOAL #3   Title Pt will be able to demonstrate ability to bend and pick up from floor without pain in order to demonstrate functional improvement in lumbopelvic function for self-care and house hold duties.    Time 4    Period Weeks    Status New               PT Long Term Goals - 02/27/21 1703       PT LONG TERM GOAL #1   Title Pt will become independent with final HEP in order to demonstrate synthesis of PT education.    Baseline 8    Period Weeks    Status New      PT LONG TERM GOAL #2   Title Pt will demonstrate at least a 12.8 improvement in Oswestry Index in order to demonstrate a clinically significant change in LBP and function.    Time 8    Period Weeks      PT LONG TERM GOAL #3   Title Pt will be able to perform 5XSTS in under 12  in order to demonstrate functional improvement above the cut off score for older adults.    Time 8    Period Weeks    Status New      PT LONG TERM GOAL #4  Title Pt will demonstrate/report ability to stand/sit/sleep without pain in order to demonstrate functional improvement and  tolerance to static positioning.    Time 8    Period Weeks    Status New                   Plan - 03/08/21 1039     Clinical Impression Statement Pt continuing to progress with increased tolerance to L/S and hip AROM. Lateral thigh and hip on L continue to be TTP but are improvement with tissue length with stretching and exercise. Pt able to tolerate S/L clam but has increased trunk compensation likely due to weakness and coordinatoin deficit. Pt given reinforcement for increasing generaly daily physical activity to reduce pain and movement sensitivity. Pt would benefit from continued skilled therapy in order to reach goals and maximize functional lumobpelvic strength and ROM for full return to PLOF.    Personal Factors and Comorbidities Age;Time since onset of injury/illness/exacerbation;Comorbidity 1;Comorbidity 2;Comorbidity 3+;Fitness    Examination-Activity Limitations Bathing;Locomotion Level;Transfers;Bend;Sit;Sleep;Carry;Squat;Stairs;Stand;Lift    Examination-Participation Restrictions Yard Work;Occupation;Community Activity;Driving    Stability/Clinical Decision Making Evolving/Moderate complexity    Rehab Potential Fair    PT Frequency 2x / week    PT Duration 8 weeks    PT Treatment/Interventions ADLs/Self Care Home Management;Aquatic Therapy;Cryotherapy;Electrical Stimulation;Iontophoresis 4mg /ml Dexamethasone;Moist Heat;Traction;Ultrasound;DME Instruction;Gait training;Stair training;Functional mobility training;Therapeutic activities;Therapeutic exercise;Balance training;Neuromuscular re-education;Patient/family education;Orthotic Fit/Training;Manual techniques;Scar mobilization;Passive range of motion;Dry needling;Taping;Vasopneumatic Device;Spinal Manipulations;Joint Manipulations    PT Next Visit Plan review HEP, SLR L, clamshell, STS    PT Home Exercise Plan Access Code: [email or web address]    Consulted and Agree with Plan of Care Patient;Family member/caregiver              Patient will benefit from skilled therapeutic intervention in order to improve the following deficits and impairments:  Abnormal gait, Pain, Improper body mechanics, Decreased mobility, Increased muscle spasms, Decreased activity tolerance, Decreased endurance, Decreased range of motion, Decreased strength  Visit Diagnosis: Pain, lumbar region  Muscle weakness (generalized)  Difficulty walking     Problem List Patient Active Problem List   Diagnosis Date Noted   COVID-19 virus infection 02/13/2021   Ureteral stone 10/06/2020   Ureteral stone with hydronephrosis 10/06/2020   Hypertension     10/08/2020 PT, DPT 03/08/21 11:01 AM   [facility] [address] [place], [place], [place] Phone: [phone]   Fax:  [phone]  Name: Ann Thompson MRN: [name] Date of Birth: 02/12/1941

## 2021-03-13 ENCOUNTER — Encounter (HOSPITAL_BASED_OUTPATIENT_CLINIC_OR_DEPARTMENT_OTHER): Payer: Self-pay | Admitting: Physical Therapy

## 2021-03-13 ENCOUNTER — Ambulatory Visit (HOSPITAL_BASED_OUTPATIENT_CLINIC_OR_DEPARTMENT_OTHER): Payer: Medicare Other | Attending: Adult Health | Admitting: Physical Therapy

## 2021-03-13 ENCOUNTER — Other Ambulatory Visit: Payer: Self-pay

## 2021-03-13 DIAGNOSIS — M545 Low back pain, unspecified: Secondary | ICD-10-CM | POA: Diagnosis not present

## 2021-03-13 DIAGNOSIS — M6281 Muscle weakness (generalized): Secondary | ICD-10-CM | POA: Diagnosis not present

## 2021-03-13 DIAGNOSIS — R262 Difficulty in walking, not elsewhere classified: Secondary | ICD-10-CM | POA: Diagnosis not present

## 2021-03-13 DIAGNOSIS — Z8616 Personal history of COVID-19: Secondary | ICD-10-CM | POA: Diagnosis not present

## 2021-03-13 NOTE — Therapy (Signed)
[facility] [address] [place], [place], [address] Phone: [phone]   Fax:  [phone]  Physical Therapy Treatment  Patient Details  Name: Ann Thompson MRN: [id number] Date of Birth: Sep 04, 1941 Referring Provider (PT): [name], NP   Encounter Date: 03/13/2021   PT End of Session - 03/13/21 1458     Visit Number 4    Number of Visits 17    Date for PT Re-Evaluation 05/28/21    Authorization Type Medicare    PT Start Time 1432    PT Stop Time 1515    PT Time Calculation (min) 43 min    Activity Tolerance Patient limited by pain    Behavior During Therapy [facility] for tasks assessed/performed             Past Medical History:  Diagnosis Date   Arthritis    DM type 2 (diabetes mellitus, type 2) (HCC)    GERD (gastroesophageal reflux disease)    History of kidney stones    Hyperlipemia    Hypertension    Left leg pain    Wears hearing aid in both ears     Past Surgical History:  Procedure Laterality Date   ABDOMINAL HYSTERECTOMY  1982   partial   APPENDECTOMY  1957   BACK SURGERY  07/12/2020   lower at [facility]   CYSTOSCOPY W/ URETERAL STENT PLACEMENT Right 10/06/2020   Procedure: CYSTOSCOPY WITH RETROGRADE PYELOGRAM/URETERAL STENT PLACEMENT;  Surgeon: Sebastian Ache, MD;  Location: [facility] OR;  Service: Urology;  Laterality: Right;   CYSTOSCOPY WITH RETROGRADE PYELOGRAM, URETEROSCOPY AND STENT PLACEMENT Right 10/26/2020   Procedure: CYSTOSCOPY WITH RETROGRADE PYELOGRAM, URETEROSCOPY AND STENT REPLACEMENT;  Surgeon: Sebastian Ache, MD;  Location: [facility];  Service: Urology;  Laterality: Right;  90 MINS   HOLMIUM LASER APPLICATION Right 10/26/2020   Procedure: HOLMIUM LASER APPLICATION;  Surgeon: Sebastian Ache, MD;  Location: [facility];  Service: Urology;  Laterality: Right;   JOINT REPLACEMENT  2016   both knees partial replacement    There were no vitals filed  for this visit.   Subjective Assessment - 03/13/21 1436     Subjective Pt states she has tried stopping the Celebrex due to thinking it might improve her recent increase in bruises. She has noticed more LBP but still has less L LE pain, though still slightly painful around her knee.    Patient is accompained by: Family member   Daughter- Ann Thompson   Pertinent History Surgery for herniated disc- no relief; injections; nerve block; restless leg syndrome    How long can you sit comfortably? 10-15 mins    How long can you stand comfortably?    How long can you walk comfortably? 20-30 mins    Patient Stated Goals Pt states she likes to golf and would love to golf.    Currently in Pain? Yes    Pain Score 4     Pain Location Hip    Pain Orientation Left    Pain Descriptors / Indicators Aching;Sore    Pain Onset Other (comment)    Multiple Pain Sites Yes                               [facility] Adult PT Treatment/Exercise - 03/13/21 0001       Bed Mobility   Bed Mobility Rolling Right;Rolling Left;Supine to Sit;Sit to Supine    Rolling  Right Other (comment)   painful   Rolling Left Other (comment)   painful, improved with log roll   Supine to Sit Independent with assistive device    Sit to Supine Independent      Posture/Postural Control   Posture/Postural Control Postural limitations    Postural Limitations Decreased lumbar lordosis;Weight shift right;Flexed trunk      Exercises   Exercises Lumbar      Lumbar Exercises: Stretches   Lower Trunk Rotation Limitations 15x 3s hold to R only    Pelvic Tilt 20 reps    Pelvic Tilt Limitations 2s    Figure 4 Stretch Limitations 30s 2x no OP    Other Lumbar Stretch Exercise seated ball flexion stretch 10x 5s hold               Lumbar Exercises: Standing   Other Standing Lumbar Exercises paloff press 10x RTB both ways    Other Standing Lumbar Exercises STS 2x10 from raised table height      Lumbar Exercises: Supine    Pelvic Tilt Limitations 20x    Bridge 20 reps    Bridge Limitations 2x10 cued for alignment   YTB at knees     Lumbar Exercises: Sidelying   Clam 20 reps    Clam Limitations YTB   2x10     Manual Therapy   Manual Therapy Joint mobilization;Soft tissue mobilization    Joint Mobilization L1-3 CPA grade I- II pain mgmt    Soft tissue mobilization T10-L5 paraspinals bilat                    PT Education - 03/13/21 1458     Education Details muscle firing, HEP, increasing daily movement variation, pain neuroscience, postural changes, envelope of function, self pain management, TENS unit usage    Person(s) Educated Patient    Methods Explanation;Demonstration;Tactile cues;Verbal cues    Comprehension Verbalized understanding;Returned demonstration;Verbal cues required;Tactile cues required              PT Short Term Goals - 02/27/21 1700       PT SHORT TERM GOAL #1   Title Pt will become independent with HEP in order to demonstrate synthesis of PT education.    Time 4    Period Weeks    Status New      PT SHORT TERM GOAL #2   Title Pt will be able to demonstrate gait without AD in order to demonstrate functional improvement in LE function for self-care and house hold duties.    Time 4    Period Weeks    Status New      PT SHORT TERM GOAL #3   Title Pt will be able to demonstrate ability to bend and pick up from floor without pain in order to demonstrate functional improvement in lumbopelvic function for self-care and house hold duties.    Time 4    Period Weeks    Status New               PT Long Term Goals - 02/27/21 1703       PT LONG TERM GOAL #1   Title Pt will become independent with final HEP in order to demonstrate synthesis of PT education.    Baseline 8    Period Weeks    Status New      PT LONG TERM GOAL #2   Title Pt will demonstrate at least a 12.8 improvement in Oswestry Index in order  to demonstrate a clinically significant change  in LBP and function.    Time 8    Period Weeks      PT LONG TERM GOAL #3   Title Pt will be able to perform 5XSTS in under 12  in order to demonstrate functional improvement above the cut off score for older adults.    Time 8    Period Weeks    Status New      PT LONG TERM GOAL #4   Title Pt will demonstrate/report ability to stand/sit/sleep without pain in order to demonstrate functional improvement and tolerance to static positioning.    Time 8    Period Weeks    Status New                   Plan - 03/13/21 1621     Clinical Impression Statement Pt with increased pain and difficulty with L LE WB at today's session that is likely correlated to recent self reduction of pain medication. However, pt was able to continue with exercise at today's session with minimal to no increase in pain. Pt did have increased L/S and lower T/S paraspinal hypertonicity and spasm that was improved with manual therapy and exercise. Pt able to tolerate isometric lumbopelvic stability exercise without increased back pain but did have L LE pain from increased pressure through LE. Pt given edu about self pain mgmt techniques and the use of her own TENS unit for pain. No comment made about medication usage. Pt would benefit from continued skilled therapy in order to reach goals and maximize functional lumobpelvic strength and ROM for prevention of further functional decline.    Personal Factors and Comorbidities Age;Time since onset of injury/illness/exacerbation;Comorbidity 1;Comorbidity 2;Comorbidity 3+;Fitness    Examination-Activity Limitations Bathing;Locomotion Level;Transfers;Bend;Sit;Sleep;Carry;Squat;Stairs;Stand;Lift    Examination-Participation Restrictions Yard Work;Occupation;Community Activity;Driving    Stability/Clinical Decision Making Evolving/Moderate complexity    Rehab Potential Fair    PT Frequency 2x / week    PT Duration 8 weeks    PT Treatment/Interventions ADLs/Self Care Home  Management;Aquatic Therapy;Cryotherapy;Electrical Stimulation;Iontophoresis 4mg /ml Dexamethasone;Moist Heat;Traction;Ultrasound;DME Instruction;Gait training;Stair training;Functional mobility training;Therapeutic activities;Therapeutic exercise;Balance training;Neuromuscular re-education;Patient/family education;Orthotic Fit/Training;Manual techniques;Scar mobilization;Passive range of motion;Dry needling;Taping;Vasopneumatic Device;Spinal Manipulations;Joint Manipulations    PT Next Visit Plan review HEP, SLR L, clamshell, STS    PT Home Exercise Plan Access Code: [email or web address]    Consulted and Agree with Plan of Care Patient;Family member/caregiver             Patient will benefit from skilled therapeutic intervention in order to improve the following deficits and impairments:  Abnormal gait, Pain, Improper body mechanics, Decreased mobility, Increased muscle spasms, Decreased activity tolerance, Decreased endurance, Decreased range of motion, Decreased strength  Visit Diagnosis: Pain, lumbar region  Difficulty walking  Muscle weakness (generalized)     Problem List Patient Active Problem List   Diagnosis Date Noted   COVID-19 virus infection 02/13/2021   Ureteral stone 10/06/2020   Ureteral stone with hydronephrosis 10/06/2020   Hypertension     10/08/2020 PT, DPT 03/13/21 4:26 PM   [facility] [address] [place], [place], [place] Phone: [phone]   Fax:  [phone]  Name: Ann Thompson MRN: [name] Date of Birth: 01/23/1941

## 2021-03-15 ENCOUNTER — Ambulatory Visit (HOSPITAL_BASED_OUTPATIENT_CLINIC_OR_DEPARTMENT_OTHER): Payer: Medicare Other | Admitting: Physical Therapy

## 2021-03-15 ENCOUNTER — Other Ambulatory Visit: Payer: Self-pay

## 2021-03-15 ENCOUNTER — Encounter (HOSPITAL_BASED_OUTPATIENT_CLINIC_OR_DEPARTMENT_OTHER): Payer: Self-pay | Admitting: Physical Therapy

## 2021-03-15 DIAGNOSIS — M545 Low back pain, unspecified: Secondary | ICD-10-CM | POA: Diagnosis not present

## 2021-03-15 DIAGNOSIS — R262 Difficulty in walking, not elsewhere classified: Secondary | ICD-10-CM

## 2021-03-15 DIAGNOSIS — M6281 Muscle weakness (generalized): Secondary | ICD-10-CM

## 2021-03-15 NOTE — Therapy (Signed)
[facility] [address] [place], [place], [address] Phone: [phone]   Fax:  [phone]  Physical Therapy Treatment  Patient Details  Name: Ann NickelBarbara Thompson MRN: [id number] Date of Birth: Jan 12, 1941 Referring Provider (PT): Shirline Freesory Nafziger, NP   Encounter Date: 03/15/2021   PT End of Session - 03/15/21 1103     Visit Number 5    Number of Visits 17    Date for PT Re-Evaluation 05/28/21    Authorization Type Medicare    PT Start Time 1100    PT Stop Time 1145    PT Time Calculation (min) 45 min    Activity Tolerance Patient limited by pain    Behavior [facility] for tasks assessed/performed             Past Medical History:  Diagnosis Date   Arthritis    DM type 2 (diabetes mellitus, type 2) (HCC)    GERD (gastroesophageal reflux disease)    History of kidney stones    Hyperlipemia    Hypertension    Left leg pain    Wears hearing aid in both ears     Past Surgical History:  Procedure Laterality Date   ABDOMINAL HYSTERECTOMY  1982   partial   APPENDECTOMY  1957   BACK SURGERY  07/12/2020   lower at [facility]   CYSTOSCOPY W/ URETERAL STENT PLACEMENT Right 10/06/2020   Procedure: CYSTOSCOPY WITH RETROGRADE PYELOGRAM/URETERAL STENT PLACEMENT;  Surgeon: Sebastian AcheManny, Theodore, MD;  [facility];  Service: Urology;  Laterality: Right;   CYSTOSCOPY WITH RETROGRADE PYELOGRAM, URETEROSCOPY AND STENT PLACEMENT Right 10/26/2020   Procedure: CYSTOSCOPY WITH RETROGRADE PYELOGRAM, URETEROSCOPY AND STENT REPLACEMENT;  Surgeon: Sebastian AcheManny, Theodore, MD;  [facility];  Service: Urology;  Laterality: Right;  90 MINS   HOLMIUM LASER APPLICATION Right 10/26/2020   Procedure: HOLMIUM LASER APPLICATION;  Surgeon: Sebastian AcheManny, Theodore, MD;  [facility];  Service: Urology;  Laterality: Right;   JOINT REPLACEMENT  2016   both knees partial replacement    There were no vitals filed  for this visit.   Subjective Assessment - 03/15/21 1059     Subjective Pt states she woke up this morning in pain and did her stretches and a hot shower seemed to helped. She reports no increased soreness after last session.    Patient is accompained by: Family member   Daughter- Boyd Kerbsenny   Pertinent History Surgery for herniated disc- no relief; injections; nerve block; restless leg syndrome    How long can you sit comfortably? 10-15 mins    How long can you stand comfortably? 15mins    How long can you walk comfortably? 20-30 mins    Patient Stated Goals Pt states she likes to golf and would love to golf.    Currently in Pain? Yes    Pain Score 3     Pain Location Leg    Pain Orientation Left    Pain Descriptors / Indicators Sore;Aching    Pain Onset Other (comment)                               [facility] Adult PT Treatment/Exercise - 03/15/21 0001       Bed Mobility   Bed Mobility Rolling Right;Rolling Left;Supine to Sit;Sit to Supine    Rolling Right Other (comment)   painful   Rolling Left Other (comment)   painful, improved with  log roll   Supine to Sit Independent with assistive device    Sit to Supine Independent      Posture/Postural Control   Posture/Postural Control Postural limitations    Postural Limitations Decreased lumbar lordosis;Weight shift right;Flexed trunk      Exercises   Exercises Lumbar      Lumbar Exercises: Stretches       Other Lumbar Stretch Exercise seated ball flexion stretch 10x 5s hold      Lumbar Exercises: Aerobic   Nustep L2 5 min      Lumbar Exercises: Standing   Other Standing Lumbar Exercises paloff press 10x RTB both ways; 5lb KB standing trunk and hip rotation (simulate golf rotation, no arms)    Other Standing Lumbar Exercises STS 2x10 RTb on knees from raised table height: HR/TR 15x      Lumbar Exercises: Seated   Other Seated Lumbar Exercises Pelvic tilting on swissball 20x   blue ball; A&P   Other Seated  Lumbar Exercises sciatic n. glide 2x10 L leg straight, heel on floor      Lumbar Exercises: Supine   Pelvic Tilt Limitations 20x    Bridge 20 reps    Bridge Limitations 2x10   RTB at knees   Other Supine Lumbar Exercises SLR on L with knee flex and ext 10x                                                  PT Education - 03/15/21 1151     Education Details HEP upate, functional deficits, home cardio exercise parameters    Person(s) Educated Patient    Methods Explanation;Demonstration;Tactile cues;Verbal cues;Handout    Comprehension Verbalized understanding;Returned demonstration;Verbal cues required;Tactile cues required              PT Short Term Goals - 02/27/21 1700       PT SHORT TERM GOAL #1   Title Pt will become independent with HEP in order to demonstrate synthesis of PT education.    Time 4    Period Weeks    Status New      PT SHORT TERM GOAL #2   Title Pt will be able to demonstrate gait without AD in order to demonstrate functional improvement in LE function for self-care and house hold duties.    Time 4    Period Weeks    Status New      PT SHORT TERM GOAL #3   Title Pt will be able to demonstrate ability to bend and pick up from floor without pain in order to demonstrate functional improvement in lumbopelvic function for self-care and house hold duties.    Time 4    Period Weeks    Status New               PT Long Term Goals - 02/27/21 1703       PT LONG TERM GOAL #1   Title Pt will become independent with final HEP in order to demonstrate synthesis of PT education.    Baseline 8    Period Weeks    Status New      PT LONG TERM GOAL #2   Title Pt will demonstrate at least a 12.8 improvement in Oswestry Index in order to demonstrate a clinically significant change in LBP and function.    Time 8  Period Weeks      PT LONG TERM GOAL #3   Title Pt will be able to perform 5XSTS in under 12  in order to demonstrate  functional improvement above the cut off score for older adults.    Time 8    Period Weeks    Status New      PT LONG TERM GOAL #4   Title Pt will demonstrate/report ability to stand/sit/sleep without pain in order to demonstrate functional improvement and tolerance to static positioning.    Time 8    Period Weeks    Status New                   Plan - 03/15/21 1152     Clinical Impression Statement Pt able to tolerate increased functional exercise progression at today's session. Pt responded favorably to sciatic n. glide and was able to perform standing loading trunk rotations without increased pain. Pt has baseline pain but no exercise increased it. Pt had abolishment of pain by the end of  session. She has palpable, painful nodule on antero-lateral aspect of proximal tibia. Does not appear to be contractile in nature. Smaller nodule present on R side as well. Pt is progressing well with improving overall daily function with moderate improvements in pain. Pt would benefit from continued skilled therapy in order to reach goals and maximize functional lumobpelvic strength and ROM for prevention of further functional decline.    Personal Factors and Comorbidities Age;Time since onset of injury/illness/exacerbation;Comorbidity 1;Comorbidity 2;Comorbidity 3+;Fitness    Examination-Activity Limitations Bathing;Locomotion Level;Transfers;Bend;Sit;Sleep;Carry;Squat;Stairs;Stand;Lift    Examination-Participation Restrictions Yard Work;Occupation;Community Activity;Driving    Stability/Clinical Decision Making Evolving/Moderate complexity    Rehab Potential Fair    PT Frequency 2x / week    PT Duration 8 weeks    PT Treatment/Interventions ADLs/Self Care Home Management;Aquatic Therapy;Cryotherapy;Electrical Stimulation;Iontophoresis 4mg /ml Dexamethasone;Moist Heat;Traction;Ultrasound;DME Instruction;Gait training;Stair training;Functional mobility training;Therapeutic activities;Therapeutic  exercise;Balance training;Neuromuscular re-education;Patient/family education;Orthotic Fit/Training;Manual techniques;Scar mobilization;Passive range of motion;Dry needling;Taping;Vasopneumatic Device;Spinal Manipulations;Joint Manipulations    PT Next Visit Plan review HEP, review sciatic n glide, review trunk rotations, STS with weight, heel toe rocking in parallel bars    PT Home Exercise Plan Access Code: [email or web address]    Consulted and Agree with Plan of Care Patient;Family member/caregiver             Patient will benefit from skilled therapeutic intervention in order to improve the following deficits and impairments:  Abnormal gait, Pain, Improper body mechanics, Decreased mobility, Increased muscle spasms, Decreased activity tolerance, Decreased endurance, Decreased range of motion, Decreased strength  Visit Diagnosis: Pain, lumbar region  Difficulty walking  Muscle weakness (generalized)     Problem List Patient Active Problem List   Diagnosis Date Noted   COVID-19 virus infection 02/13/2021   Ureteral stone 10/06/2020   Ureteral stone with hydronephrosis 10/06/2020   Hypertension    10/08/2020 PT, DPT 03/15/21 12:00 PM  [facility] [address] [place], [place], [place] Phone: [phone]   Fax:  [phone]  Name: Graceyn Fodor MRN: Ann Thompson Date of Birth: 1941-06-22   Access Code: [email or web address] URL: [email or web address] Date: 03/15/2021 Prepared by: 05/16/2021  Exercises Supine Posterior Pelvic Tilt - 2 x daily - 7 x weekly - 3 sets - 10 reps Supine Lower Trunk Rotation - 2 x daily - 7 x weekly - 2 sets - 10 reps - 3 hold Seated Quadratus Lumborum Stretch with Arm Overhead - 2 x daily - 7 x weekly -  1 sets - 3 reps - 30 hold Supine Single Knee to Chest Stretch - 2 x daily - 7 x weekly - 1 sets - 3 reps - 30 hold Supine Bridge with Resistance Band - 1 x daily - 7 x weekly - 3 sets - 10 reps

## 2021-03-15 NOTE — Patient Instructions (Signed)
Access Code: [email or web address] URL: [email or web address] Date: 03/15/2021 Prepared by: Zebedee Iba  Exercises Supine Posterior Pelvic Tilt - 2 x daily - 7 x weekly - 3 sets - 10 reps Supine Lower Trunk Rotation - 2 x daily - 7 x weekly - 2 sets - 10 reps - 3 hold Seated Quadratus Lumborum Stretch with Arm Overhead - 2 x daily - 7 x weekly - 1 sets - 3 reps - 30 hold Supine Single Knee to Chest Stretch - 2 x daily - 7 x weekly - 1 sets - 3 reps - 30 hold Supine Bridge with Resistance Band - 1 x daily - 7 x weekly - 3 sets - 10 reps

## 2021-03-18 ENCOUNTER — Other Ambulatory Visit: Payer: Self-pay

## 2021-03-18 ENCOUNTER — Encounter (HOSPITAL_BASED_OUTPATIENT_CLINIC_OR_DEPARTMENT_OTHER): Payer: Self-pay | Admitting: Physical Therapy

## 2021-03-18 ENCOUNTER — Ambulatory Visit (HOSPITAL_BASED_OUTPATIENT_CLINIC_OR_DEPARTMENT_OTHER): Payer: Medicare Other | Admitting: Physical Therapy

## 2021-03-18 DIAGNOSIS — M545 Low back pain, unspecified: Secondary | ICD-10-CM | POA: Diagnosis not present

## 2021-03-18 DIAGNOSIS — R262 Difficulty in walking, not elsewhere classified: Secondary | ICD-10-CM

## 2021-03-18 DIAGNOSIS — M6281 Muscle weakness (generalized): Secondary | ICD-10-CM

## 2021-03-18 NOTE — Therapy (Signed)
[facility] [address] [place], [place], [address] Phone: [phone]   Fax:  [phone]  Physical Therapy Treatment  Patient Details  Name: Ann Thompson MRN: [id number] Date of Birth: 09-05-1941 Referring Provider (PT): [name], NP   Encounter Date: 03/18/2021   PT End of Session - 03/18/21 1155     Visit Number 6    Number of Visits 17    Date for PT Re-Evaluation 05/28/21    Authorization Type Medicare    PT Start Time 1145    PT Stop Time 1230    PT Time Calculation (min) 45 min    Activity Tolerance Patient limited by pain    Behavior During Therapy [facility] for tasks assessed/performed             Past Medical History:  Diagnosis Date   Arthritis    DM type 2 (diabetes mellitus, type 2) (HCC)    GERD (gastroesophageal reflux disease)    History of kidney stones    Hyperlipemia    Hypertension    Left leg pain    Wears hearing aid in both ears     Past Surgical History:  Procedure Laterality Date   ABDOMINAL HYSTERECTOMY  1982   partial   APPENDECTOMY  1957   BACK SURGERY  07/12/2020   lower at [facility]   CYSTOSCOPY W/ URETERAL STENT PLACEMENT Right 10/06/2020   Procedure: CYSTOSCOPY WITH RETROGRADE PYELOGRAM/URETERAL STENT PLACEMENT;  Surgeon: Sebastian Ache, MD;  Location: [facility] OR;  Service: Urology;  Laterality: Right;   CYSTOSCOPY WITH RETROGRADE PYELOGRAM, URETEROSCOPY AND STENT PLACEMENT Right 10/26/2020   Procedure: CYSTOSCOPY WITH RETROGRADE PYELOGRAM, URETEROSCOPY AND STENT REPLACEMENT;  Surgeon: Sebastian Ache, MD;  Location: [facility];  Service: Urology;  Laterality: Right;  90 MINS   HOLMIUM LASER APPLICATION Right 10/26/2020   Procedure: HOLMIUM LASER APPLICATION;  Surgeon: Sebastian Ache, MD;  [facility];  Service: Urology;  Laterality: Right;   JOINT REPLACEMENT  2016   both knees partial replacement    There were no vitals  filed for this visit.   Subjective Assessment - 03/18/21 1147     Subjective Pt states she is still painful first thing in the morning but she feels the pain is better now. She was sore for about 1-2 days after last session.    Patient is accompained by: Family member   Daughter- Boyd Kerbs   Pertinent History Surgery for herniated disc- no relief; injections; nerve block; restless leg syndrome    How long can you sit comfortably? 10-15 mins    How long can you stand comfortably?    How long can you walk comfortably? 20-30 mins    Patient Stated Goals Pt states she likes to golf and would love to golf.    Currently in Pain? No/denies    Pain Score 0-No pain    Pain Onset Other (comment)                               [facility] Adult PT Treatment/Exercise - 03/18/21 0001                                Posture/Postural Control   Posture/Postural Control Postural limitations    Postural Limitations Decreased lumbar lordosis;Weight shift right;Flexed trunk      Exercises   Exercises  Lumbar      Lumbar Exercises: Stretches   Figure 4 Stretch Limitations 30s 2x no OP    Other Lumbar Stretch Exercise seated ball flexion stretch 10x 5s hold   3 way     Lumbar Exercises: Aerobic   Nustep L2 7 min      Lumbar Exercises: Standing   Other Standing Lumbar Exercises paloff press 10x RTB both ways; 5lb KB standing trunk and hip rotation at mirror  (simulate golf rotation, no arms)    Other Standing Lumbar Exercises STS 2x10 RTb on knees from raised table height 5lb KB: HR/TR 15x; heel toe rocking 20x            Other Seated Lumbar Exercises sciatic n. glide 2x10 L leg straight, heel on floor      Lumbar Exercises: Supine   Bridge 20 reps    Bridge Limitations 2x10   RTB at knees         Manual Therapy   Manual Therapy Joint mobilization;Soft tissue mobilization    Joint Mobilization L1-3 CPA grade II-III   prone   Soft tissue mobilization T10-L5 paraspinals  bilat                    PT Education - 03/18/21 1247     Education Details HEP upate, strength progression,  home cardio exercise parameters, trunk rotation and STS technique for home    Person(s) Educated Patient    Methods Explanation;Demonstration;Tactile cues;Verbal cues    Comprehension Verbalized understanding;Returned demonstration;Verbal cues required;Tactile cues required              PT Short Term Goals - 02/27/21 1700       PT SHORT TERM GOAL #1   Title Pt will become independent with HEP in order to demonstrate synthesis of PT education.    Time 4    Period Weeks    Status New      PT SHORT TERM GOAL #2   Title Pt will be able to demonstrate gait without AD in order to demonstrate functional improvement in LE function for self-care and house hold duties.    Time 4    Period Weeks    Status New      PT SHORT TERM GOAL #3   Title Pt will be able to demonstrate ability to bend and pick up from floor without pain in order to demonstrate functional improvement in lumbopelvic function for self-care and house hold duties.    Time 4    Period Weeks    Status New               PT Long Term Goals - 02/27/21 1703       PT LONG TERM GOAL #1   Title Pt will become independent with final HEP in order to demonstrate synthesis of PT education.    Baseline 8    Period Weeks    Status New      PT LONG TERM GOAL #2   Title Pt will demonstrate at least a 12.8 improvement in Oswestry Index in order to demonstrate a clinically significant change in LBP and function.    Time 8    Period Weeks      PT LONG TERM GOAL #3   Title Pt will be able to perform 5XSTS in under 12  in order to demonstrate functional improvement above the cut off score for older adults.    Time 8    Period Weeks  Status New      PT LONG TERM GOAL #4   Title Pt will demonstrate/report ability to stand/sit/sleep without pain in order to demonstrate functional improvement and  tolerance to static positioning.    Time 8    Period Weeks    Status New                   Plan - 03/18/21 1251     Clinical Impression Statement Pt able to continue with increased functional exercise progression at today's session. Pt able tolerate more increased resistance without increasing baseline level of pain. Pt had increase fatigue in L LE at end of session but had improved gait quality following cuing for heel strike and toe off.  Pt has strength and endurace deficits in the LE with increased loading intensity. Pt to travel soon and will need to walk through sand during her family vacation. Plan to incorport dynamic balance/stepping at next session. Pt would benefit from continued skilled therapy in order to reach goals and maximize functional lumobpelvic strength and ROM for prevention of further functional decline.    Personal Factors and Comorbidities Age;Time since onset of injury/illness/exacerbation;Comorbidity 1;Comorbidity 2;Comorbidity 3+;Fitness    Examination-Activity Limitations Bathing;Locomotion Level;Transfers;Bend;Sit;Sleep;Carry;Squat;Stairs;Stand;Lift    Examination-Participation Restrictions Yard Work;Occupation;Community Activity;Driving    Stability/Clinical Decision Making Evolving/Moderate complexity    Rehab Potential Fair    PT Frequency 2x / week    PT Duration 8 weeks    PT Treatment/Interventions ADLs/Self Care Home Management;Aquatic Therapy;Cryotherapy;Electrical Stimulation;Iontophoresis 4mg /ml Dexamethasone;Moist Heat;Traction;Ultrasound;DME Instruction;Gait training;Stair training;Functional mobility training;Therapeutic activities;Therapeutic exercise;Balance training;Neuromuscular re-education;Patient/family education;Orthotic Fit/Training;Manual techniques;Scar mobilization;Passive range of motion;Dry needling;Taping;Vasopneumatic Device;Spinal Manipulations;Joint Manipulations        PT Home Exercise Plan Access Code: [email or web address]    Consulted  and Agree with Plan of Care Patient;Family member/caregiver             Patient will benefit from skilled therapeutic intervention in order to improve the following deficits and impairments:  Abnormal gait, Pain, Improper body mechanics, Decreased mobility, Increased muscle spasms, Decreased activity tolerance, Decreased endurance, Decreased range of motion, Decreased strength  Visit Diagnosis: Pain, lumbar region  Difficulty walking  Muscle weakness (generalized)     Problem List Patient Active Problem List   Diagnosis Date Noted   COVID-19 virus infection 02/13/2021   Ureteral stone 10/06/2020   Ureteral stone with hydronephrosis 10/06/2020   Hypertension    10/08/2020 PT, DPT 03/18/21 1:25 PM   [facility] [address] [place], [place], [place] Phone: [phone]   Fax:  [phone]  Name: Ann Thompson MRN: [name] Date of Birth: 1940/12/20

## 2021-03-21 ENCOUNTER — Encounter (HOSPITAL_BASED_OUTPATIENT_CLINIC_OR_DEPARTMENT_OTHER): Payer: Self-pay | Admitting: Physical Therapy

## 2021-03-21 ENCOUNTER — Other Ambulatory Visit: Payer: Self-pay

## 2021-03-21 ENCOUNTER — Ambulatory Visit (HOSPITAL_BASED_OUTPATIENT_CLINIC_OR_DEPARTMENT_OTHER): Payer: Medicare Other | Admitting: Physical Therapy

## 2021-03-21 DIAGNOSIS — R262 Difficulty in walking, not elsewhere classified: Secondary | ICD-10-CM

## 2021-03-21 DIAGNOSIS — M545 Low back pain, unspecified: Secondary | ICD-10-CM | POA: Diagnosis not present

## 2021-03-21 DIAGNOSIS — M6281 Muscle weakness (generalized): Secondary | ICD-10-CM

## 2021-03-21 NOTE — Therapy (Signed)
[facility] [address] [place], [place], [address] Phone: [phone]   Fax:  [phone]  Physical Therapy Treatment  Patient Details  Name: Ann Thompson MRN: [id number] Date of Birth: [birth year] Referring Provider (PT): [name], NP   Encounter Date: 03/21/2021   PT End of Session - 03/21/21 1144     Visit Number 7    Number of Visits 17    Date for PT Re-Evaluation 05/28/21    Authorization Type Medicare    PT Start Time 1145    PT Stop Time 1230    PT Time Calculation (min) 45 min    Activity Tolerance Patient limited by pain    Behavior During Therapy [facility] for tasks assessed/performed             Past Medical History:  Diagnosis Date   Arthritis    DM type 2 (diabetes mellitus, type 2) (HCC)    GERD (gastroesophageal reflux disease)    History of kidney stones    Hyperlipemia    Hypertension    Left leg pain    Wears hearing aid in both ears     Past Surgical History:  Procedure Laterality Date   ABDOMINAL HYSTERECTOMY  1982   partial   APPENDECTOMY  1957   BACK SURGERY  07/12/2020   lower at [facility]   CYSTOSCOPY W/ URETERAL STENT PLACEMENT Right 10/06/2020   Procedure: CYSTOSCOPY WITH RETROGRADE PYELOGRAM/URETERAL STENT PLACEMENT;  Surgeon: Sebastian Ache, MD;  Location[facility];  Service: Urology;  Laterality: Right;   CYSTOSCOPY WITH RETROGRADE PYELOGRAM, URETEROSCOPY AND STENT PLACEMENT Right 10/26/2020   Procedure: CYSTOSCOPY WITH RETROGRADE PYELOGRAM, URETEROSCOPY AND STENT REPLACEMENT;  Surgeon: Sebastian Ache, MD;  Location: [facility];  Service: Urology;  Laterality: Right;  90 MINS   HOLMIUM LASER APPLICATION Right 10/26/2020   Procedure: HOLMIUM LASER APPLICATION;  Surgeon: Sebastian Ache, MD;  Location: [facility];  Service: Urology;  Laterality: Right;   JOINT REPLACEMENT  2016   both knees partial replacement    There were no vitals  filed for this visit.   Subjective Assessment - 03/21/21 1144     Subjective Pt states she is doing well. She still has morning pain and was sore for about a day after last session.    Patient is accompained by: Family member   Daughter- Boyd Kerbs   Pertinent History Surgery for herniated disc- no relief; injections; nerve block; restless leg syndrome    How long can you sit comfortably? 10-15 mins    How long can you stand comfortably?    How long can you walk comfortably? 20-30 mins    Patient Stated Goals Pt states she likes to golf and would love to golf.    Currently in Pain? No/denies    Pain Score 0-No pain    Pain Onset Other (comment)                 [facility] Adult PT Treatment/Exercise - 03/21/21 0001       Posture/Postural Control   Posture/Postural Control Postural limitations    Postural Limitations Decreased lumbar lordosis;Weight shift right;Flexed trunk      Exercises   Exercises Lumbar      Lumbar Exercises: Stretches   Figure 4 Stretch Limitations 30s 2x with OP    Other Lumbar Stretch Exercise sidelying quad stretch 30s 3x      Lumbar Exercises: Aerobic   Nustep L2 7 min  Lumbar Exercises: Standing   Other Standing Lumbar Exercises paloff press 15x RTB both ways; 5lb KB standing trunk and hip rotation at mirror  (simulate golf rotation, no arms)    Other Standing Lumbar Exercises STS 2x10 RTb on knees from raised table height 7lb KB; sidestep onto foam 10x each; fwd step onto foam 10x each      Lumbar Exercises: Seated   Other Seated Lumbar Exercises sciatic n. glide 2x10 3s hold L leg straight, heel on floor                                                  PT Education - 03/21/21 1151     Education Details HEP upate, strength progression, home cardio exercise parameters, progress made during PT    Person(s) Educated Patient    Methods Explanation;Demonstration;Tactile cues;Verbal cues    Comprehension Verbalized  understanding;Returned demonstration;Verbal cues required;Tactile cues required              PT Short Term Goals - 02/27/21 1700       PT SHORT TERM GOAL #1   Title Pt will become independent with HEP in order to demonstrate synthesis of PT education.    Time 4    Period Weeks    Status New      PT SHORT TERM GOAL #2   Title Pt will be able to demonstrate gait without AD in order to demonstrate functional improvement in LE function for self-care and house hold duties.    Time 4    Period Weeks    Status New      PT SHORT TERM GOAL #3   Title Pt will be able to demonstrate ability to bend and pick up from floor without pain in order to demonstrate functional improvement in lumbopelvic function for self-care and house hold duties.    Time 4    Period Weeks    Status New               PT Long Term Goals - 02/27/21 1703       PT LONG TERM GOAL #1   Title Pt will become independent with final HEP in order to demonstrate synthesis of PT education.    Baseline 8    Period Weeks    Status New      PT LONG TERM GOAL #2   Title Pt will demonstrate at least a 12.8 improvement in Oswestry Index in order to demonstrate a clinically significant change in LBP and function.    Time 8    Period Weeks      PT LONG TERM GOAL #3   Title Pt will be able to perform 5XSTS in under 12  in order to demonstrate functional improvement above the cut off score for older adults.    Time 8    Period Weeks    Status New      PT LONG TERM GOAL #4   Title Pt will demonstrate/report ability to stand/sit/sleep without pain in order to demonstrate functional improvement and tolerance to static positioning.    Time 8    Period Weeks    Status New                   Plan - 03/21/21 1248     Clinical Impression Statement Pt able to progress  to dynamic balance activity at today's session though required intermittent UE assist and CGA for safety. Pt with decreased L LE stability and  SLS strength as she was performing step up. Pt with trunk lean compensation due to decrease knee extensor strength. Pt had significant knee flexion and hip flexion tension during S/L quad stretch and unable to reach full hip extension. However, pt has improved significantly with mobility and requires less usage of AD during mobility tasks. Pt would benefit from continued skilled therapy in order to reach goals and maximize functional lumobpelvic strength and ROM for prevention of further functional decline.    Personal Factors and Comorbidities Age;Time since onset of injury/illness/exacerbation;Comorbidity 1;Comorbidity 2;Comorbidity 3+;Fitness    Examination-Activity Limitations Bathing;Locomotion Level;Transfers;Bend;Sit;Sleep;Carry;Squat;Stairs;Stand;Lift    Examination-Participation Restrictions Yard Work;Occupation;Community Activity;Driving    Stability/Clinical Decision Making Evolving/Moderate complexity    Rehab Potential Fair    PT Frequency 2x / week    PT Duration 8 weeks    PT Treatment/Interventions ADLs/Self Care Home Management;Aquatic Therapy;Cryotherapy;Electrical Stimulation;Iontophoresis 4mg /ml Dexamethasone;Moist Heat;Traction;Ultrasound;DME Instruction;Gait training;Stair training;Functional mobility training;Therapeutic activities;Therapeutic exercise;Balance training;Neuromuscular re-education;Patient/family education;Orthotic Fit/Training;Manual techniques;Scar mobilization;Passive range of motion;Dry needling;Taping;Vasopneumatic Device;Spinal Manipulations;Joint Manipulations    PT Next Visit Plan --    PT Home Exercise Plan Access Code: [email or web address]    Consulted and Agree with Plan of Care Patient;Family member/caregiver             Patient will benefit from skilled therapeutic intervention in order to improve the following deficits and impairments:  Abnormal gait, Pain, Improper body mechanics, Decreased mobility, Increased muscle spasms, Decreased activity tolerance,  Decreased endurance, Decreased range of motion, Decreased strength  Visit Diagnosis: Pain, lumbar region  Difficulty walking  Muscle weakness (generalized)     Problem List Patient Active Problem List   Diagnosis Date Noted   COVID-19 virus infection 02/13/2021   Ureteral stone 10/06/2020   Ureteral stone with hydronephrosis 10/06/2020   Hypertension     10/08/2020 PT, DPT 03/21/21 1:15 PM   [facility] [address] [place], [place], [place] Phone: [phone]   Fax:  [phone]  Name: Ann Thompson MRN: [name] Date of Birth: March 06, 1941

## 2021-03-25 ENCOUNTER — Other Ambulatory Visit: Payer: Self-pay

## 2021-03-25 ENCOUNTER — Encounter (HOSPITAL_BASED_OUTPATIENT_CLINIC_OR_DEPARTMENT_OTHER): Payer: Self-pay | Admitting: Physical Therapy

## 2021-03-25 ENCOUNTER — Ambulatory Visit (HOSPITAL_BASED_OUTPATIENT_CLINIC_OR_DEPARTMENT_OTHER): Payer: Medicare Other | Admitting: Physical Therapy

## 2021-03-25 DIAGNOSIS — M6281 Muscle weakness (generalized): Secondary | ICD-10-CM

## 2021-03-25 DIAGNOSIS — M545 Low back pain, unspecified: Secondary | ICD-10-CM | POA: Diagnosis not present

## 2021-03-25 DIAGNOSIS — R262 Difficulty in walking, not elsewhere classified: Secondary | ICD-10-CM

## 2021-03-25 NOTE — Therapy (Signed)
[facility] [address] [place], [place], [address] Phone: [phone]   Fax:  [phone]  Physical Therapy Treatment  Patient Details  Name: Ann Thompson MRN: [id number] Date of Birth: 08/20/1941 Referring Provider (PT): [name], NP   Encounter Date: 03/25/2021   PT End of Session - 03/25/21 1306     Visit Number 8    Number of Visits 17    Date for PT Re-Evaluation 05/28/21    Authorization Type Medicare    PT Start Time 1150    PT Stop Time 1230    PT Time Calculation (min) 40 min    Activity Tolerance Patient limited by pain    Behavior During Therapy [facility] for tasks assessed/performed             Past Medical History:  Diagnosis Date   Arthritis    DM type 2 (diabetes mellitus, type 2) (HCC)    GERD (gastroesophageal reflux disease)    History of kidney stones    Hyperlipemia    Hypertension    Left leg pain    Wears hearing aid in both ears     Past Surgical History:  Procedure Laterality Date   ABDOMINAL HYSTERECTOMY  1982   partial   APPENDECTOMY  1957   BACK SURGERY  07/12/2020   lower at [facility]   CYSTOSCOPY W/ URETERAL STENT PLACEMENT Right 10/06/2020   Procedure: CYSTOSCOPY WITH RETROGRADE PYELOGRAM/URETERAL STENT PLACEMENT;  Surgeon: Sebastian Ache, MD;  Location: [facility];  Service: Urology;  Laterality: Right;   CYSTOSCOPY WITH RETROGRADE PYELOGRAM, URETEROSCOPY AND STENT PLACEMENT Right 10/26/2020   Procedure: CYSTOSCOPY WITH RETROGRADE PYELOGRAM, URETEROSCOPY AND STENT REPLACEMENT;  Surgeon: Sebastian Ache, MD;  Location: [facility];  Service: Urology;  Laterality: Right;  90 MINS   HOLMIUM LASER APPLICATION Right 10/26/2020   Procedure: HOLMIUM LASER APPLICATION;  Surgeon: Sebastian Ache, MD;  Location: [facility];  Service: Urology;  Laterality: Right;   JOINT REPLACEMENT  2016   both knees partial replacement    There were no vitals  filed for this visit.   Subjective Assessment - 03/25/21 1151     Subjective Pt states the morning pain is still "terrible." She has had low energy recently but could be from previous COVID-19. She states she was more sore from last session.   Patient is accompained by: Family member   Daughter- Boyd Kerbs   Pertinent History Surgery for herniated disc- no relief; injections; nerve block; restless leg syndrome    How long can you sit comfortably? 10-15 mins    How long can you stand comfortably?    How long can you walk comfortably? 20-30 mins    Patient Stated Goals Pt states she likes to golf and would love to golf.    Currently in Pain? Yes    Pain Score 5     Pain Location Leg    Pain Orientation Left    Pain Descriptors / Indicators Aching;Sore    Pain Onset Other (comment)                               [facility] Adult PT Treatment/Exercise - 03/25/21 0001       Posture/Postural Control   Posture/Postural Control Postural limitations    Postural Limitations Decreased lumbar lordosis;Weight shift right;Flexed trunk      Exercises   Exercises Lumbar  Lumbar Exercises: Stretches   Figure 4 Stretch Limitations 30s 2x with OP supine and seated        Other Lumbar Stretch Exercise seated flexion 3 way with blue ball 5s 10x               Lumbar Exercises: Machines for Strengthening   Leg Press 75# 2x10 RTB at knees      Lumbar Exercises: Standing   Other Standing Lumbar Exercises paloff press 15x GTB both ways;    Other Standing Lumbar Exercises STS 2x10 RTb on knees from raised table height 7lb KB;               Manual Therapy   Soft tissue mobilization L2-L5 paraspinals bilat; L hip rotators, gluteals                    PT Education - 03/25/21 1307     Education Details muscle firing, HEP, increasing daily movement variation, pain neuroscience, envelope of function, active recovery, DOMS expectations    Person(s) Educated Patient     Methods Explanation;Demonstration;Tactile cues;Verbal cues    Comprehension Verbalized understanding;Returned demonstration;Verbal cues required;Tactile cues required              PT Short Term Goals - 02/27/21 1700       PT SHORT TERM GOAL #1   Title Pt will become independent with HEP in order to demonstrate synthesis of PT education.    Time 4    Period Weeks    Status New      PT SHORT TERM GOAL #2   Title Pt will be able to demonstrate gait without AD in order to demonstrate functional improvement in LE function for self-care and house hold duties.    Time 4    Period Weeks    Status New      PT SHORT TERM GOAL #3   Title Pt will be able to demonstrate ability to bend and pick up from floor without pain in order to demonstrate functional improvement in lumbopelvic function for self-care and house hold duties.    Time 4    Period Weeks    Status New               PT Long Term Goals - 02/27/21 1703       PT LONG TERM GOAL #1   Title Pt will become independent with final HEP in order to demonstrate synthesis of PT education.    Baseline 8    Period Weeks    Status New      PT LONG TERM GOAL #2   Title Pt will demonstrate at least a 12.8 improvement in Oswestry Index in order to demonstrate a clinically significant change in LBP and function.    Time 8    Period Weeks      PT LONG TERM GOAL #3   Title Pt will be able to perform 5XSTS in under 12  in order to demonstrate functional improvement above the cut off score for older adults.    Time 8    Period Weeks    Status New      PT LONG TERM GOAL #4   Title Pt will demonstrate/report ability to stand/sit/sleep without pain in order to demonstrate functional improvement and tolerance to static positioning.    Time 8    Period Weeks    Status New  Plan - 03/25/21 1209     Clinical Impression Statement Pt presented with increased L hip hypertonicity and muscle spasm at  beginning of today's session. Pt had report of decreased pain and improved hip ROM following STM. Pt exercise program not progressed at this time likely due to reaching current tolerable threshold for loading. Pt still able to continue with CKC exercise with slightly increased pain into L LE and hip with resistance/WB. HEP not progressed at this time and pt instructed to perform active recovery until next session. Pt would benefit from continued skilled therapy in order to reach goals and maximize functional mobility and prevent further functional decline.    Personal Factors and Comorbidities Age;Time since onset of injury/illness/exacerbation;Comorbidity 1;Comorbidity 2;Comorbidity 3+;Fitness    Examination-Activity Limitations Bathing;Locomotion Level;Transfers;Bend;Sit;Sleep;Carry;Squat;Stairs;Stand;Lift    Examination-Participation Restrictions Yard Work;Occupation;Community Activity;Driving    Stability/Clinical Decision Making Evolving/Moderate complexity    Rehab Potential Fair    PT Frequency 2x / week    PT Duration 8 weeks    PT Treatment/Interventions ADLs/Self Care Home Management;Aquatic Therapy;Cryotherapy;Electrical Stimulation;Iontophoresis 4mg /ml Dexamethasone;Moist Heat;Traction;Ultrasound;DME Instruction;Gait training;Stair training;Functional mobility training;Therapeutic activities;Therapeutic exercise;Balance training;Neuromuscular re-education;Patient/family education;Orthotic Fit/Training;Manual techniques;Scar mobilization;Passive range of motion;Dry needling;Taping;Vasopneumatic Device;Spinal Manipulations;Joint Manipulations    PT Home Exercise Plan Access Code: [email or web address]    Consulted and Agree with Plan of Care Patient;Family member/caregiver             Patient will benefit from skilled therapeutic intervention in order to improve the following deficits and impairments:  Abnormal gait, Pain, Improper body mechanics, Decreased mobility, Increased muscle spasms, Decreased  activity tolerance, Decreased endurance, Decreased range of motion, Decreased strength  Visit Diagnosis: Pain, lumbar region  Difficulty walking  Muscle weakness (generalized)     Problem List Patient Active Problem List   Diagnosis Date Noted   COVID-19 virus infection 02/13/2021   Ureteral stone 10/06/2020   Ureteral stone with hydronephrosis 10/06/2020   Hypertension     10/08/2020 PT, DPT 03/25/21 1:09 PM   [facility] [address] [place], [place], [place] Phone: [phone]   Fax:  [phone]  Name: Terryl Niziolek MRN: [name] Date of Birth: 16-Dec-1940

## 2021-03-28 ENCOUNTER — Encounter (HOSPITAL_BASED_OUTPATIENT_CLINIC_OR_DEPARTMENT_OTHER): Payer: Self-pay | Admitting: Physical Therapy

## 2021-03-28 ENCOUNTER — Ambulatory Visit (HOSPITAL_BASED_OUTPATIENT_CLINIC_OR_DEPARTMENT_OTHER): Payer: Medicare Other | Admitting: Physical Therapy

## 2021-03-28 ENCOUNTER — Other Ambulatory Visit: Payer: Self-pay

## 2021-03-28 DIAGNOSIS — R262 Difficulty in walking, not elsewhere classified: Secondary | ICD-10-CM

## 2021-03-28 DIAGNOSIS — M545 Low back pain, unspecified: Secondary | ICD-10-CM

## 2021-03-28 DIAGNOSIS — M6281 Muscle weakness (generalized): Secondary | ICD-10-CM

## 2021-03-28 NOTE — Therapy (Signed)
[facility] [address] [place], [place], [address] Phone: [phone]   Fax:  [phone]  Physical Therapy Treatment  Patient Details  Name: Ann Thompson MRN: [id number] Date of Birth: 10/28/1940 Referring Provider (PT): [name], NP   Encounter Date: 03/28/2021   PT End of Session - 03/28/21 1340     Visit Number 9    Number of Visits 17    Date for PT Re-Evaluation 05/28/21    Authorization Type Medicare    PT Start Time 1150    PT Stop Time 1230    PT Time Calculation (min) 40 min    Activity Tolerance Patient limited by pain    Behavior During Therapy [facility] for tasks assessed/performed             Past Medical History:  Diagnosis Date   Arthritis    DM type 2 (diabetes mellitus, type 2) (HCC)    GERD (gastroesophageal reflux disease)    History of kidney stones    Hyperlipemia    Hypertension    Left leg pain    Wears hearing aid in both ears     Past Surgical History:  Procedure Laterality Date   ABDOMINAL HYSTERECTOMY  1982   partial   APPENDECTOMY  1957   BACK SURGERY  07/12/2020   lower at [facility]   CYSTOSCOPY W/ URETERAL STENT PLACEMENT Right 10/06/2020   Procedure: CYSTOSCOPY WITH RETROGRADE PYELOGRAM/URETERAL STENT PLACEMENT;  Surgeon: Sebastian Ache, MD;  Location: [facility] OR;  Service: Urology;  Laterality: Right;   CYSTOSCOPY WITH RETROGRADE PYELOGRAM, URETEROSCOPY AND STENT PLACEMENT Right 10/26/2020   Procedure: CYSTOSCOPY WITH RETROGRADE PYELOGRAM, URETEROSCOPY AND STENT REPLACEMENT;  Surgeon: Sebastian Ache, MD;  [facility];  Service: Urology;  Laterality: Right;  90 MINS   HOLMIUM LASER APPLICATION Right 10/26/2020   Procedure: HOLMIUM LASER APPLICATION;  Surgeon: Sebastian Ache, MD;  Location[facility];  Service: Urology;  Laterality: Right;   JOINT REPLACEMENT  2016   both knees partial replacement    There were no vitals  filed for this visit.   Subjective Assessment - 03/28/21 1155     Subjective Pt states that she has less L LE pain than before. It is more in the small of the back. She had some groin soreness after last session but she is unsure if it was from exercise or not.    Patient is accompained by: Family member   Daughter- Ann Thompson   Pertinent History Surgery for herniated disc- no relief; injections; nerve block; restless leg syndrome    How long can you sit comfortably? 10-15 mins    How long can you stand comfortably?    How long can you walk comfortably? 20-30 mins    Patient Stated Goals Pt states she likes to golf and would love to golf.    Currently in Pain? Yes    Pain Score 4     Pain Location Back/hip   Pain Orientation Mid;Lower    Pain Descriptors / Indicators Aching;Sore    Pain Onset Other (comment)                               [facility] Adult PT Treatment/Exercise - 03/28/21 0001       Posture/Postural Control   Posture/Postural Control Postural limitations    Postural Limitations Decreased lumbar lordosis;Weight shift right;Flexed trunk  Exercises   Exercises Lumbar      Lumbar Exercises: Stretches   Figure 4 Stretch Limitations 30s 2x with OP supine and seated bilat    Other Lumbar Stretch Exercise sidelying quad stretch 30s 3x    Other Lumbar Stretch Exercise seated flexion 3 way with blue ball 5s 10x; standing hip flexor stretch 30s 3x bilat               Lumbar Exercises: Machines for Strengthening   Leg Press 75# 2x10 RTB at knees      Lumbar Exercises: Standing   Other Standing Lumbar Exercises STS 10x      Manual Therapy   Joint Mobilization L LE LAD grade III    Soft tissue mobilization L2-L5 paraspinals bilat; L hip rotators, gluteals                    PT Education - 03/28/21 1339     Education Details HEP, pain neuroscience, centralization, envelope of function, recovery, DOMS expectations    Person(s)  Educated Patient    Methods Explanation;Demonstration;Tactile cues;Verbal cues    Comprehension Verbalized understanding;Returned demonstration;Verbal cues required;Tactile cues required              PT Short Term Goals - 02/27/21 1700       PT SHORT TERM GOAL #1   Title Pt will become independent with HEP in order to demonstrate synthesis of PT education.    Time 4    Period Weeks    Status New      PT SHORT TERM GOAL #2   Title Pt will be able to demonstrate gait without AD in order to demonstrate functional improvement in LE function for self-care and house hold duties.    Time 4    Period Weeks    Status New      PT SHORT TERM GOAL #3   Title Pt will be able to demonstrate ability to bend and pick up from floor without pain in order to demonstrate functional improvement in lumbopelvic function for self-care and house hold duties.    Time 4    Period Weeks    Status New               PT Long Term Goals - 02/27/21 1703       PT LONG TERM GOAL #1   Title Pt will become independent with final HEP in order to demonstrate synthesis of PT education.    Baseline 8    Period Weeks    Status New      PT LONG TERM GOAL #2   Title Pt will demonstrate at least a 12.8 improvement in Oswestry Index in order to demonstrate a clinically significant change in LBP and function.    Time 8    Period Weeks      PT LONG TERM GOAL #3   Title Pt will be able to perform 5XSTS in under 12  in order to demonstrate functional improvement above the cut off score for older adults.    Time 8    Period Weeks    Status New      PT LONG TERM GOAL #4   Title Pt will demonstrate/report ability to stand/sit/sleep without pain in order to demonstrate functional improvement and tolerance to static positioning.    Time 8    Period Weeks    Status New  Plan - 03/28/21 1341     Clinical Impression Statement Pt began session with L hip anterior and posterior pain  at today's session with report of decreased frequency of L LE pain/distal pain. Pt likley experiencing centralization of symptoms at this time. Pt did have improved soft tissue extensbility and hip mobility following manual therapy and stretching. 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 Plan to trial next sessions in aquatics in order to aid in improved functional mobility and LE strength. Pt would benefit from continued skilled therapy in order to reach goals and maximize functional mobility and prevent further functional decline.    Personal Factors and Comorbidities Age;Time since onset of injury/illness/exacerbation;Comorbidity 1;Comorbidity 2;Comorbidity 3+;Fitness    Examination-Activity Limitations Bathing;Locomotion Level;Transfers;Bend;Sit;Sleep;Carry;Squat;Stairs;Stand;Lift    Examination-Participation Restrictions Yard Work;Occupation;Community Activity;Driving    Stability/Clinical Decision Making Evolving/Moderate complexity    Rehab Potential Fair    PT Frequency 2x / week    PT Duration 8 weeks    PT Treatment/Interventions ADLs/Self Care Home Management;Aquatic Therapy;Cryotherapy;Electrical Stimulation;Iontophoresis 4mg /ml Dexamethasone;Moist Heat;Traction;Ultrasound;DME Instruction;Gait training;Stair training;Functional mobility training;Therapeutic activities;Therapeutic exercise;Balance training;Neuromuscular re-education;Patient/family education;Orthotic Fit/Training;Manual techniques;Scar mobilization;Passive range of motion;Dry needling;Taping;Vasopneumatic Device;Spinal Manipulations;Joint Manipulations    PT Home Exercise Plan Access Code: [email or web address]    Consulted and Agree with Plan of Care Patient;Family member/caregiver             Patient will benefit from skilled therapeutic intervention in order to improve the following deficits and impairments:  Abnormal gait, Pain, Improper body mechanics, Decreased mobility, Increased muscle spasms,  Decreased activity tolerance, Decreased endurance, Decreased range of motion, Decreased strength  Visit Diagnosis: Pain, lumbar region  Muscle weakness (generalized)  Difficulty walking     Problem List Patient Active Problem List   Diagnosis Date Noted   COVID-19 virus infection 02/13/2021   Ureteral stone 10/06/2020   Ureteral stone with hydronephrosis 10/06/2020   Hypertension     10/08/2020 PT, DPT 03/28/21 1:55 PM  [facility] [address] [place], [place], [place] Phone: [phone]   Fax:  [phone]  Name: Ann Thompson MRN: [name] Date of Birth: Jun 29, 1941

## 2021-04-01 ENCOUNTER — Encounter: Payer: Self-pay | Admitting: Adult Health

## 2021-04-08 ENCOUNTER — Other Ambulatory Visit: Payer: Self-pay

## 2021-04-09 ENCOUNTER — Ambulatory Visit: Payer: Medicare Other | Admitting: Adult Health

## 2021-04-18 ENCOUNTER — Ambulatory Visit (HOSPITAL_BASED_OUTPATIENT_CLINIC_OR_DEPARTMENT_OTHER): Payer: Medicare Other | Admitting: Physical Therapy

## 2021-04-19 ENCOUNTER — Ambulatory Visit (HOSPITAL_BASED_OUTPATIENT_CLINIC_OR_DEPARTMENT_OTHER): Payer: Medicare Other | Attending: Adult Health | Admitting: Physical Therapy

## 2021-04-19 ENCOUNTER — Other Ambulatory Visit: Payer: Self-pay

## 2021-04-19 ENCOUNTER — Encounter (HOSPITAL_BASED_OUTPATIENT_CLINIC_OR_DEPARTMENT_OTHER): Payer: Self-pay | Admitting: Physical Therapy

## 2021-04-19 DIAGNOSIS — M5416 Radiculopathy, lumbar region: Secondary | ICD-10-CM | POA: Insufficient documentation

## 2021-04-19 DIAGNOSIS — M545 Low back pain, unspecified: Secondary | ICD-10-CM

## 2021-04-19 DIAGNOSIS — R262 Difficulty in walking, not elsewhere classified: Secondary | ICD-10-CM

## 2021-04-19 DIAGNOSIS — M6281 Muscle weakness (generalized): Secondary | ICD-10-CM

## 2021-04-19 NOTE — Therapy (Signed)
[name] [address] [place], [place], [address] Phone: [phone]   Fax:  [phone]  Physical Therapy Treatment  Patient Details  Name: Ann Thompson MRN: [id number] Date of Birth: [birth year] Referring Provider (PT): [name], NP   Encounter Date: 04/19/2021   PT End of Session - 04/19/21 1044     Visit Number 10    Number of Visits 17    Date for PT Re-Evaluation 05/28/21    Authorization Type Medicare    PT Start Time 1020    PT Stop Time 1100    PT Time Calculation (min) 40 min    Activity Tolerance Patient limited by pain    Behavior During Therapy WFL for tasks assessed/performed             Past Medical History:  Diagnosis Date   Arthritis    DM type 2 (diabetes mellitus, type 2) (HCC)    GERD (gastroesophageal reflux disease)    History of kidney stones    Hyperlipemia    Hypertension    Left leg pain    Wears hearing aid in both ears     Past Surgical History:  Procedure Laterality Date   ABDOMINAL HYSTERECTOMY  1982   partial   APPENDECTOMY  1957   BACK SURGERY  07/12/2020   lower at [facility]   CYSTOSCOPY W/ URETERAL STENT PLACEMENT Right 10/06/2020   Procedure: CYSTOSCOPY WITH RETROGRADE PYELOGRAM/URETERAL STENT PLACEMENT;  Surgeon: Sebastian Ache, MD;  Location: [facility] OR;  Service: Urology;  Laterality: Right;   CYSTOSCOPY WITH RETROGRADE PYELOGRAM, URETEROSCOPY AND STENT PLACEMENT Right 10/26/2020   Procedure: CYSTOSCOPY WITH RETROGRADE PYELOGRAM, URETEROSCOPY AND STENT REPLACEMENT;  Surgeon: Sebastian Ache, MD;  Location: [facility];  Service: Urology;  Laterality: Right;  90 MINS   HOLMIUM LASER APPLICATION Right 10/26/2020   Procedure: HOLMIUM LASER APPLICATION;  Surgeon: Sebastian Ache, MD;  Location: [facility];  Service: Urology;  Laterality: Right;   JOINT REPLACEMENT  2016   both knees partial replacement    There were no vitals  filed for this visit.   Subjective Assessment - 04/19/21 1021     Subjective Pt states she is in more pain from her long periods of sitting while on vacation. She states she got an injection from Dr. Ethelene Hal into her L hip on Wednesday. She states it has not changed her pain.    Patient is accompained by: Family member   Daughter- Ann Thompson   Pertinent History Surgery for herniated disc- no relief; injections; nerve block; restless leg syndrome    How long can you sit comfortably? 10-15 mins    How long can you stand comfortably?    How long can you walk comfortably? 20-30 mins    Patient Stated Goals Pt states she likes to golf and would love to golf.    Currently in Pain? Yes    Pain Score 7     Pain Location Back    Pain Orientation Lower    Pain Descriptors / Indicators Aching;Sore    Pain Onset Other (comment)                               [facility] Adult PT Treatment/Exercise - 04/19/21 0001       Posture/Postural Control   Posture/Postural Control Postural limitations    Postural Limitations Decreased lumbar lordosis;Weight shift right;Flexed trunk  Exercises   Exercises Lumbar      Lumbar Exercises: Stretches   Lower Trunk Rotation Limitations 15x 3s    Pelvic Tilt Limitations 20x (dc due to pain)      Lumbar Exercises: Aerobic   Nustep L2 7 min      Manual Therapy   Soft tissue mobilization L2-L5 paraspinals bilat; L hip rotators, gluteals                    PT Education - 04/19/21 1210     Education Details HEP, pain neuroscience,  DOMS, restarting exercise/HEP, aquatic therapy options    Person(s) Educated Patient    Methods Explanation;Demonstration;Tactile cues;Verbal cues    Comprehension Verbalized understanding;Returned demonstration              PT Short Term Goals - 02/27/21 1700       PT SHORT TERM GOAL #1   Title Pt will become independent with HEP in order to demonstrate synthesis of PT education.    Time  4    Period Weeks    Status New      PT SHORT TERM GOAL #2   Title Pt will be able to demonstrate gait without AD in order to demonstrate functional improvement in LE function for self-care and house hold duties.    Time 4    Period Weeks    Status New      PT SHORT TERM GOAL #3   Title Pt will be able to demonstrate ability to bend and pick up from floor without pain in order to demonstrate functional improvement in lumbopelvic function for self-care and house hold duties.    Time 4    Period Weeks    Status New               PT Long Term Goals - 02/27/21 1703       PT LONG TERM GOAL #1   Title Pt will become independent with final HEP in order to demonstrate synthesis of PT education.    Baseline 8    Period Weeks    Status New      PT LONG TERM GOAL #2   Title Pt will demonstrate at least a 12.8 improvement in Oswestry Index in order to demonstrate a clinically significant change in LBP and function.    Time 8    Period Weeks      PT LONG TERM GOAL #3   Title Pt will be able to perform 5XSTS in under 12  in order to demonstrate functional improvement above the cut off score for older adults.    Time 8    Period Weeks    Status New      PT LONG TERM GOAL #4   Title Pt will demonstrate/report ability to stand/sit/sleep without pain in order to demonstrate functional improvement and tolerance to static positioning.    Time 8    Period Weeks    Status New                   Plan - 04/19/21 1052     Clinical Impression Statement Pt pain signficantly increased at today's session with exacerbation of pain with most exercises into the L hip and LE. Pt has mild relief of symptoms but continued to have antalgic gait and pain with transfers at end of session. Pt was able to tolerate gentle rotational stretching and movement through nustep and supine exercise. Progress note to be performed  at next session or soon thereafter when pt is able to tolerate more standing  and AROM. Pt to transition to aquatic therapy soon in order to address land deficits. Pt would benefit from continued skilled therapy in order to reach goals and maximize functional mobility and prevent further functional decline.    Personal Factors and Comorbidities Age;Time since onset of injury/illness/exacerbation;Comorbidity 1;Comorbidity 2;Comorbidity 3+;Fitness    Examination-Activity Limitations Bathing;Locomotion Level;Transfers;Bend;Sit;Sleep;Carry;Squat;Stairs;Stand;Lift    Examination-Participation Restrictions Yard Work;Occupation;Community Activity;Driving    Stability/Clinical Decision Making Evolving/Moderate complexity    Rehab Potential Fair    PT Frequency 2x / week    PT Duration 8 weeks    PT Treatment/Interventions ADLs/Self Care Home Management;Aquatic Therapy;Cryotherapy;Electrical Stimulation;Iontophoresis 4mg /ml Dexamethasone;Moist Heat;Traction;Ultrasound;DME Instruction;Gait training;Stair training;Functional mobility training;Therapeutic activities;Therapeutic exercise;Balance training;Neuromuscular re-education;Patient/family education;Orthotic Fit/Training;Manual techniques;Scar mobilization;Passive range of motion;Dry needling;Taping;Vasopneumatic Device;Spinal Manipulations;Joint Manipulations    PT Home Exercise Plan Access Code: [email or web address]    Consulted and Agree with Plan of Care Patient;Family member/caregiver             Patient will benefit from skilled therapeutic intervention in order to improve the following deficits and impairments:  Abnormal gait, Pain, Improper body mechanics, Decreased mobility, Increased muscle spasms, Decreased activity tolerance, Decreased endurance, Decreased range of motion, Decreased strength  Visit Diagnosis: Pain, lumbar region  Muscle weakness (generalized)  Difficulty walking     Problem List Patient Active Problem List   Diagnosis Date Noted   COVID-19 virus infection 02/13/2021   Ureteral stone 10/06/2020    Ureteral stone with hydronephrosis 10/06/2020   Hypertension     10/08/2020 PT, DPT 04/19/21 12:13 PM  [facility] [address] [place], [place], [place] Phone: [phone]   Fax:  [phone]  Name: Ann Thompson MRN: [name] Date of Birth: 1940/12/25

## 2021-04-23 ENCOUNTER — Telehealth: Payer: Self-pay | Admitting: Physical Medicine and Rehabilitation

## 2021-04-23 NOTE — Telephone Encounter (Signed)
Patient's husband called. He says wife would like an appointment with Dr. Alvester Morin. CB number is [phone]

## 2021-04-23 NOTE — Telephone Encounter (Signed)
Scheduled for new patient OV. 

## 2021-05-01 ENCOUNTER — Encounter: Payer: Self-pay | Admitting: Physical Medicine and Rehabilitation

## 2021-05-01 ENCOUNTER — Ambulatory Visit (INDEPENDENT_AMBULATORY_CARE_PROVIDER_SITE_OTHER): Payer: Medicare Other | Admitting: Physical Medicine and Rehabilitation

## 2021-05-01 VITALS — BP 150/84 | HR 65

## 2021-05-01 DIAGNOSIS — M5116 Intervertebral disc disorders with radiculopathy, lumbar region: Secondary | ICD-10-CM | POA: Diagnosis not present

## 2021-05-01 DIAGNOSIS — M961 Postlaminectomy syndrome, not elsewhere classified: Secondary | ICD-10-CM

## 2021-05-01 DIAGNOSIS — M25552 Pain in left hip: Secondary | ICD-10-CM

## 2021-05-01 DIAGNOSIS — M5416 Radiculopathy, lumbar region: Secondary | ICD-10-CM | POA: Diagnosis not present

## 2021-05-01 NOTE — Progress Notes (Signed)
Left buttock pain radiating down outside of left thigh to knee. Partial knee replacements bilaterally in 2016. States that she has a lot of left knee pain. 3 injections with Dr. Ethelene Hal have not helped. Last injection was 2 weeks ago.  Numeric Pain Rating Scale and Functional Assessment Average Pain 10 Pain Right Now 10 My pain is constant, dull, and aching Pain is worse with:  when first getting up in the morning Pain improves with: pacing activities   In the last MONTH (on 0-10 scale) has pain interfered with the following?  1. General activity like being  able to carry out your everyday physical activities such as walking, climbing stairs, carrying groceries, or moving a chair?  Rating(10)  2. Relation with others like being able to carry out your usual social activities and roles such as  activities at home, at work and in your community. Rating(10)  3. Enjoyment of life such that you have  been bothered by emotional problems such as feeling anxious, depressed or irritable?  Rating(5)

## 2021-05-02 ENCOUNTER — Telehealth: Payer: Self-pay | Admitting: Physical Medicine and Rehabilitation

## 2021-05-02 ENCOUNTER — Encounter: Payer: Self-pay | Admitting: Physical Medicine and Rehabilitation

## 2021-05-02 DIAGNOSIS — M25559 Pain in unspecified hip: Secondary | ICD-10-CM | POA: Insufficient documentation

## 2021-05-02 NOTE — Telephone Encounter (Signed)
Pt husband called and states it urgent he speaks with courtney asap.  [phone]

## 2021-05-02 NOTE — Progress Notes (Signed)
Ann Thompson - 80 y.o. female MRN [id number]  Date of birth: 1940-12-29  Office Visit Note: Visit Date: 05/01/2021 PCP: [name], NP Referred by: [name], NP  Subjective: Chief Complaint  Patient presents with   Lower Back - Pain   HPI: Ann Thompson is a 80 y.o. female who comes in today As a new patient self-referral for chronic severe and recalcitrant left hip and leg pain status post lumbar laminectomy and discectomy by Dr. Marikay Alar.  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  She reports moving to town about a year ago from [place] to be closer to her daughter.  Her husband is here and provides some of the history.  She was having chronic worsening left radicular type leg pain which appears to be in a pretty classic L5 dermatome to the front of the ankle.  No right-sided complaints.  Her history is such that she saw Dr. Marikay Alar and MRI was performed and he ultimately completed lumbar laminectomy discectomy on the left at L4-5.  Her pain really started progressing in July 2021.  MRI at that time did show disc herniation and narrowing on the left.  She underwent 1 epidural injection at the time without much relief.  Post surgery she continued to have radicular leg pain.  Postsurgical MRI was performed with contrast and this is reviewed below and reviewed with the patient today.  This shows residual disc and narrowing on the left at L4-5 likely impacting the L5 nerve root.  There is a right-sided disc protrusion foraminally at L3-4 which is not symptomatic.  There is also central disc protrusion at L5-S1 which is not symptomatic.  She went on to see Dr. Callie Fielding and received a electrodiagnostic study of the left lower limb that she is unsure of the results.  She also underwent some type of what she refers to as a nerve block which I assume was a selective nerve root  block/transforaminal epidural injection.  She reports this did not help.  She reports that ultimately she was told there was really not much they could do and she did end up seeing Dr. Sheran Luz and his nurse practitioner Su Hoff, FNP.  She reports that the nurse practitioner dictated 3 injections to have performed and she did have those injections but none of those seem to help.  She does not really remember an evaluation by Dr. Ethelene Hal.  She cannot tell me what injections were performed.  She does state that none of them worked.  Unfortunately I do not have their notes to review and I do not have the electrodiagnostic study to review.  She has taken some mild pain medication but is currently not taking any at this point and taking Tylenol and just doing what she can with this prolonged pain history since last year.  She reports no new symptoms no focal weakness no bowel or bladder changes.  Her case is complicated by type 2 diabetes.  She currently rates her average and worst pain is 10 out of 10 constant dull aching pain particularly worse in the morning when she first gets up some relief in the afternoon with standing and moving.  She gets worsening symptoms with prolonged sitting.  It does affect her daily living and she is really frustrated at this point with the amount of pain that she is having.  She has had extensive physical therapy and dry needling.  Review of Systems  Musculoskeletal:  Positive for back pain.       Left hip and leg pain  Neurological:  Negative for tingling and focal weakness.  All other systems reviewed and are negative. Otherwise per HPI.  Assessment & Plan: Visit Diagnoses:    ICD-10-CM   1. Lumbar radiculopathy  M54.16     2. Radiculopathy due to lumbar intervertebral disc disorder  M51.16     3. Post laminectomy syndrome  M96.1     4. Pain in left hip  M25.552        Plan: Findings:  Chronic, severe and recalcitrant left hip and leg pain in a pretty  classic L5 distribution or dermatome.  She has intact sensation and strength but with MRI findings of recurrent disc protrusion as well as lateral recess narrowing at L4-5 likely impacting the left L5 nerve root.  I would like to see the notes from Dr. Sheran Luzichard Ramos particularly which injections he performed.  I would like to see the electrodiagnostic study by Dr. Murray HodgkinsBartko but is not critical.  The patient is going to obtain those records for us.  I would expect the next course of action would be a diagnostic and hopefully therapeutic L5 transforaminal epidural steroid injection.  Consider doing the injection with oral Valium for mild sedation.  She had prior injections performed at the surgery center by Dr. Ethelene Halamos.  Briefly went over spinal cord stimulator trials and implants for patients with recalcitrant pain.  No medication changes today what is something we could look at although she has had multiple medication attempts with no relief.  She has had physical therapy and everything else trying to get some pain relief and she is pretty distraught at this point.  Consider pain psychology referral for coping strategies and cognitive behavioral treatment/dialectic behavior treatment.   Meds & Orders: No orders of the defined types were placed in this encounter.  No orders of the defined types were placed in this encounter.   Follow-up: Return for Get records to review and plan on L5 transforaminal injection..   Procedures: No procedures performed      Clinical History: CLINICAL DATA:  Lumbar radiculopathy. Additional history provided by scanning technologist: Patient reports low back pain with left leg pain/weakness/numbness since July 2021. History of lumbar surgery.   EXAM: MRI LUMBAR SPINE WITHOUT AND WITH CONTRAST   CONTRAST:  15mL MULTIHANCE GADOBENATE DIMEGLUMINE 529 MG/ML IV SOLN     FINDINGS:   Alignment: Trace retrolisthesis at L1-L2, L2-L3 and L3-L4, unchanged.   Vertebrae: Vertebral  body height is maintained. Trace degenerative endplate edema and enhancement at L4-L5. Additionally, there is edema and enhancement along the left greater than right L4-L5 facet joints, likely degenerative. Multilevel degenerative endplate irregularity with small Schmorl nodes. Moderate fatty degenerative endplate marrow signal at L5-S1.   Conus medullaris and cauda equina: Conus extends to the L1-L2 level. No signal abnormality within the visualized distal spinal cord.   Paraspinal and other soft tissues: Multiple bilateral cystic renal lesions more fully characterized on the prior CT abdomen/pelvis of 10/06/2020. Redemonstrated right nephrolithiasis. Postsurgical changes to the lower dorsal paraspinal soft tissues.   Disc levels:   L3-L4: Trace retrolisthesis. Disc bulge. Superimposed broad-based right subarticular to right extraforaminal disc extrusion with slight caudal migration. Mild facet arthrosis. As before, the disc protrusion contributes to moderate right subarticular stenosis, encroaching upon the descending right L4 nerve root. Central canal patent.  The disc protrusion also contributes to moderate right neural foraminal narrowing, unchanged. Mild left neural foraminal narrowing, unchanged.   L4-L5: Interval left laminectomy and discectomy. Disc bulge with endplate spurring. Superimposed small residual/recurrent left subarticular disc extrusion with slight caudal migration. Moderate facet arthrosis. Although improved, there is persistent left subarticular narrowing with the disc extrusion contacting and posteriorly displacing the descending left L5 nerve root (series 13, image 30). Minimal relative right subarticular and central canal narrowing. Moderate bilateral neural foraminal narrowing (progressed on the right).   L5-S1: Disc bulge with endplate spurring. Superimposed central disc protrusion slightly eccentric to the right. Mild-to-moderate facet arthrosis. As  before, the disc protrusion contributes to mild bilateral subarticular narrowing without frank nerve root impingement. Central canal patent. Bilateral neural foraminal narrowing (moderate/severe right, moderate left).   IMPRESSION: Comparison is made to the prior lumbar spine MRI of 04/05/2020.   At L4-L5, there has been interval left laminectomy and discectomy. There is a small residual/recurrent left subarticular disc extrusion with slight caudal migration. Although improved, there is persistent left subarticular narrowing with the disc extrusion contacting and posteriorly displacing the descending left L5 nerve root. Multifactorial moderate bilateral neural foraminal narrowing (progressed on the right).   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   At L3-L4, a caudally migrated broad-based right subarticular to right extraforaminal disc extrusion contributes to moderate right subarticular stenosis, encroaching upon the descending right L4 nerve root. It also contributes to moderate right neural foraminal narrowing.   At L5-S1, there is advanced disc degeneration. A small central disc protrusion contributes to mild bilateral subarticular narrowing without appreciable nerve root impingement. Multifactorial bilateral neural foraminal narrowing (moderate/severe right, moderate left).   Right nephrolithiasis.   Bilateral cystic renal lesions more fully characterized on the CT abdomen/pelvis of 10/06/2020.     Electronically Signed   By: Jackey Loge DO   On: 10/26/2020 08:30   She reports that she quit smoking about 39 years ago. Her smoking use included cigarettes. She has a 25.00 pack-year smoking history. She has never used smokeless tobacco.  Recent Labs    06/22/20 1039 09/21/20 1338 12/20/20 1034  HGBA1C 6.7* 7.5* 6.9*    Objective:  VS:  HT:    WT:   BMI:     BP:(!) 150/84  HR:65bpm  TEMP: ( )  RESP:   Physical Exam Vitals and nursing note reviewed.  Constitutional:      General: She is not in acute distress.    Appearance: Normal appearance. She is obese. She is not ill-appearing.  HENT:     Head: Normocephalic and atraumatic.     Right Ear: External ear normal.     Left Ear: External ear normal.  Eyes:     Extraocular Movements: Extraocular movements intact.  Cardiovascular:     Rate and Rhythm: Normal rate.     Pulses: Normal pulses.  Pulmonary:     Effort: Pulmonary effort is normal. No respiratory distress.  Abdominal:     General: There is no distension.     Palpations: Abdomen is soft.  Musculoskeletal:        General: Tenderness present.     Cervical back: Neck supple.     Right lower leg: No edema.     Left lower leg: Edema present.     Comments: Patient has some difficulty going from sit to stand.  She has some pain with extension and facet loading of the lumbar spine but not  concordant.  She also has pain over the left greater trochanter more than right but not concordant pain.  No pain with hip rotation.  She has good distal strength particularly with dorsiflexion plantarflexion and EHL bilaterally.  She does not have any allodynia or color changes.  She has a positive slump test on the left.  She has pain and dysesthesia in L5 dermatome.  Skin:    Findings: No erythema, lesion or rash.  Neurological:     General: No focal deficit present.     Mental Status: She is alert and oriented to person, place, and time.     Cranial Nerves: No cranial nerve deficit.     Sensory: No sensory deficit.     Motor: No weakness or abnormal muscle tone.     Coordination: Coordination normal.     Gait: Gait abnormal.  Psychiatric:        Mood and Affect: Mood normal.        Behavior: Behavior normal.    Ortho Exam  Imaging: No results found.  Past Medical/Family/Surgical/Social History: Medications & Allergies reviewed per EMR, new medications updated. 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   Past Medical History:  Diagnosis Date   Arthritis    DM type 2 (diabetes mellitus, type 2) (HCC)    GERD (gastroesophageal reflux disease)    History of kidney stones    Hyperlipemia    Hypertension    Left leg pain    Wears hearing aid in both ears    Family History  Family history unknown: Yes   Past Surgical History:  Procedure Laterality Date   ABDOMINAL HYSTERECTOMY  1982   partial   APPENDECTOMY  1957   BACK SURGERY  07/12/2020   lower at [facility]   CYSTOSCOPY W/ URETERAL STENT PLACEMENT Right 10/06/2020   Procedure: CYSTOSCOPY WITH RETROGRADE PYELOGRAM/URETERAL STENT PLACEMENT;  Surgeon: Sebastian Ache, MD;  Location: [facility] OR;  Service: Urology;  Laterality: Right;   CYSTOSCOPY WITH RETROGRADE PYELOGRAM, URETEROSCOPY AND STENT PLACEMENT Right 10/26/2020   Procedure: CYSTOSCOPY WITH RETROGRADE PYELOGRAM, URETEROSCOPY AND STENT REPLACEMENT;  Surgeon: Sebastian Ache, MD;  Location: [facility];  Service: Urology;  Laterality: Right;  90 MINS   HOLMIUM LASER APPLICATION Right 10/26/2020   Procedure: HOLMIUM LASER APPLICATION;  Surgeon: Sebastian Ache, MD;  Location[facility];  Service: Urology;  Laterality: Right;   JOINT REPLACEMENT  2016   both knees partial replacement   Social History   Occupational History   Not on file  Tobacco Use   Smoking status: Former    Packs/day: 1.00    Years: 25.00     Pack years: 25.00    Types: Cigarettes    Quit date: 09/08/1981    Years since quitting: 39.6   Smokeless tobacco: Never  Vaping Use   Vaping Use: Never used  Substance and Sexual Activity   Alcohol use: Never   Drug use: Never   Sexual activity: Not on file

## 2021-05-08 ENCOUNTER — Ambulatory Visit (HOSPITAL_BASED_OUTPATIENT_CLINIC_OR_DEPARTMENT_OTHER): Payer: Medicare Other | Admitting: Physical Therapy

## 2021-05-09 ENCOUNTER — Ambulatory Visit (INDEPENDENT_AMBULATORY_CARE_PROVIDER_SITE_OTHER): Payer: Medicare Other | Admitting: Physical Medicine and Rehabilitation

## 2021-05-09 ENCOUNTER — Encounter: Payer: Self-pay | Admitting: Physical Medicine and Rehabilitation

## 2021-05-09 ENCOUNTER — Other Ambulatory Visit: Payer: Self-pay

## 2021-05-09 ENCOUNTER — Ambulatory Visit: Payer: Self-pay

## 2021-05-09 VITALS — BP 159/74 | HR 75

## 2021-05-09 DIAGNOSIS — G8929 Other chronic pain: Secondary | ICD-10-CM

## 2021-05-09 DIAGNOSIS — M5416 Radiculopathy, lumbar region: Secondary | ICD-10-CM

## 2021-05-09 DIAGNOSIS — M25511 Pain in right shoulder: Secondary | ICD-10-CM

## 2021-05-09 MED ORDER — METHYLPREDNISOLONE ACETATE 80 MG/ML IJ SUSP
80.0000 mg | Freq: Once | INTRAMUSCULAR | Status: AC
  Administered 2021-05-09: 80 mg

## 2021-05-09 NOTE — Patient Instructions (Signed)

## 2021-05-09 NOTE — Progress Notes (Signed)
Pt state lower back pain that travels to her left hip and leg. Pt state getting out of bed in the morning the pain is worse. Pt state she takes pain meds and uses heat to help ease her pain.  Numeric Pain Rating Scale and Functional Assessment Average Pain 5   In the last MONTH (on 0-10 scale) has pain interfered with the following?  1. General activity like being  able to carry out your everyday physical activities such as walking, climbing stairs, carrying groceries, or moving a chair?  Rating(10)   +Driver, -BT, -Dye Allergies.

## 2021-05-14 ENCOUNTER — Encounter (HOSPITAL_BASED_OUTPATIENT_CLINIC_OR_DEPARTMENT_OTHER): Payer: Self-pay | Admitting: Physical Therapy

## 2021-05-14 ENCOUNTER — Ambulatory Visit (HOSPITAL_BASED_OUTPATIENT_CLINIC_OR_DEPARTMENT_OTHER): Payer: Medicare Other | Attending: Adult Health | Admitting: Physical Therapy

## 2021-05-14 ENCOUNTER — Other Ambulatory Visit: Payer: Self-pay

## 2021-05-14 DIAGNOSIS — M6281 Muscle weakness (generalized): Secondary | ICD-10-CM | POA: Insufficient documentation

## 2021-05-14 DIAGNOSIS — R262 Difficulty in walking, not elsewhere classified: Secondary | ICD-10-CM

## 2021-05-14 DIAGNOSIS — M545 Low back pain, unspecified: Secondary | ICD-10-CM

## 2021-05-14 NOTE — Patient Instructions (Addendum)
Access Code: [email or web address] URL: [email or web address] Date: 05/14/2021 Prepared by: Zebedee Iba  Exercises Supine Posterior Pelvic Tilt - 2 x daily - 7 x weekly - 3 sets - 10 reps Supine Lower Trunk Rotation - 2 x daily - 7 x weekly - 2 sets - 10 reps - 3 hold Supine Single Knee to Chest Stretch - 2 x daily - 7 x weekly - 1 sets - 3 reps - 30 hold Neutral Lumbar Spine Curl Up - 1 x daily - 7 x weekly - 2 sets - 10 reps - 2 hold Supine Bridge - 1 x daily - 7 x weekly - 2 sets - 10 reps

## 2021-05-14 NOTE — Therapy (Signed)
[facility] [address] [place], [place], [address] Phone: [phone]   Fax:  [phone]  Physical Therapy Progress Note  Patient Details  Name: Ann Thompson MRN: [id number] Date of Birth: 03-Apr-1941 Referring Provider (PT): Dorothyann Peng, NP   Encounter Date: 05/14/2021   PT End of Session - 05/14/21 1357     Visit Number 11    Number of Visits 17    Date for PT Re-Evaluation 05/28/21    Authorization Type Medicare    PT Start Time 1346    PT Stop Time 1430    PT Time Calculation (min) 44 min    Activity Tolerance Patient limited by pain    Behavior During Therapy [facility] for tasks assessed/performed             Past Medical History:  Diagnosis Date   Arthritis    DM type 2 (diabetes mellitus, type 2) ([place])    GERD (gastroesophageal reflux disease)    History of kidney stones    Hyperlipemia    Hypertension    Left leg pain    Wears hearing aid in both ears     Past Surgical History:  Procedure Laterality Date   ABDOMINAL HYSTERECTOMY  1982   partial   [place]  07/12/2020   lower [facility]   [place] Right 10/06/2020   Procedure: CYSTOSCOPY WITH RETROGRADE PYELOGRAM/URETERAL STENT PLACEMENT;  Surgeon: Alexis Frock, MD;  Location: [facility];  Service: Urology;  Laterality: Right;   CYSTOSCOPY WITH RETROGRADE PYELOGRAM, URETEROSCOPY AND STENT PLACEMENT Right 10/26/2020   Procedure: CYSTOSCOPY WITH RETROGRADE PYELOGRAM, URETEROSCOPY AND STENT REPLACEMENT;  Surgeon: Alexis Frock, MD;  Location: [facility];  Service: Urology;  Laterality: Right;  90 MINS   HOLMIUM LASER APPLICATION Right 6/56/8127   Procedure: HOLMIUM LASER APPLICATION;  Surgeon: Alexis Frock, MD;  Location: [facility];  Service: Urology;  Laterality: Right;   JOINT REPLACEMENT  2016   both knees partial replacement    There were no vitals  filed for this visit.   Subjective Assessment - 05/14/21 1349     Subjective Pt states she got an injection on 9/1 and "so far as it hasn't helped me." Pt finds that walking around seems more difficult compared to when she was consistently coming to therapy. She states walking around [employer] to look at flowers was hard.    Patient is accompained by: Family member   Daughter- Ann Thompson   Pertinent History Surgery for herniated disc- no relief; injections; nerve block; restless leg syndrome    How long can you sit comfortably? 10-15 mins    How long can you stand comfortably? 57mns    How long can you walk comfortably? 20-30 mins    Patient Stated Goals Pt states she likes to golf and would love to golf.    Currently in Pain? Yes    Pain Score 5     Pain Location Leg    Pain Orientation Left;Lower    Pain Descriptors / Indicators Aching;Sore    Pain Type Chronic pain;Neuropathic pain    Pain Radiating Towards lateral lower leg    Pain Onset More than a month ago    Pain Frequency Intermittent                [facility] PT Assessment - 05/14/21 0001       Assessment   Medical Diagnosis M54.16 (ICD-10-CM) -  Lumbar radiculopathy    Referring Provider (PT) Dorothyann Peng, NP    Prior Therapy N/A      Precautions   Precautions None      Restrictions   Weight Bearing Restrictions No      Balance Screen   Has the patient fallen in the past 6 months No    Has the patient had a decrease in activity level because of a fear of falling?  No    Is the patient reluctant to leave their home because of a fear of falling?  No      Home Ecologist residence      Prior Function   Level of Independence Independent      Cognition   Overall Cognitive Status Within Functional Limits for tasks assessed      Observation/Other Assessments   Focus on Therapeutic Outcomes (FOTO)  16 / 50 or 32 %      Sensation   Light Touch Appears Intact      Functional Tests    Functional tests Squat;Sit to Stand      Squat   Comments quarter depth, quad dominant, unable to pick item from floor      Sit to Stand   Comments UE support needed for first repetition, increased time, pain with movement      Posture/Postural Control   Posture/Postural Control Postural limitations    Postural Limitations Decreased lumbar lordosis;Weight shift right;Flexed trunk                   AROM   Overall AROM Comments L/S flex 75%, ext 50%, L rot R rot 50%, L SB and R SB 60%      PROM   Overall PROM Comments untested due to increased pain in L hip      Strength   Overall Strength Comments 4-/5 L hip throughout- significant fatiguing weakness; L knee ext 4+/5, flexion 4/5      Flexibility   Soft Tissue Assessment /Muscle Length yes    Hamstrings signficantly limited with supine 90/90    Quadratus Lumborum sig limited on L      Palpation   Spinal mobility L1-5 extension glide restriction with CPA in  S/L    SI assessment  TTP along R SIJ    Palpation comment TTP post and lateral hip of L LE, report of less pain into L thigh      Special Tests    Special Tests Lumbar;Sacrolliac Tests    Lumbar Tests Slump Test;Prone Knee Bend Test;Straight Leg Raise          Slump test   Findings Positive      Straight Leg Raise   Findings Positive      Bed Mobility   Bed Mobility Rolling Right;Rolling Left;Supine to Sit;Sit to Supine    Rolling Right Other (comment);Independent   painful   Rolling Left Independent   painful, unable to lay on L   Supine to Sit Contact Guard/Touching assist    Sit to Supine Independent      Transfers   Five time sit to stand comments  24.5s      Ambulation/Gait   Ambulation/Gait Yes    Ambulation/Gait Assistance 7: Independent    Ambulation Distance (Feet) 35 Feet    Gait Pattern Decreased stance time - left;Decreased step length - right;Trendelenburg;Antalgic;Lateral trunk lean to right;Trunk flexed;Narrow base of support  [facility] Adult PT Treatment/Exercise - 05/14/21 0001       Exercises   Exercises Lumbar      Lumbar Exercises: Stretches   Lower Trunk Rotation Limitations 15x 3s    Pelvic Tilt Limitations 2x10 2s hold   Other Lumbar Stretch Exercise SKTC 5s 10x               Lumbar Exercises: Supine   Other Supine Lumbar Exercises bridge 10x; McGill curl up 2s 10x      Manual Therapy   Soft tissue mobilization L2-L5 paraspinals bilat; L hip rotators, gluteals                    PT Education - 05/14/21 1356     Education Details HEP, DOMS, restarting exercise/HEP, POC, testing results/implications    Person(s) Educated Patient    Methods Explanation;Tactile cues;Demonstration;Handout;Verbal cues    Comprehension Verbalized understanding;Returned demonstration              PT Short Term Goals - 05/14/21 1539       PT SHORT TERM GOAL #1   Title Pt will become independent with HEP in order to demonstrate synthesis of PT education.    Time 4    Period Weeks    Status On-going      PT SHORT TERM GOAL #2   Title Pt will be able to demonstrate gait without AD in order to demonstrate functional improvement in LE function for self-care and house hold duties.    Time 4    Period Weeks    Status Achieved      PT SHORT TERM GOAL #3   Title Pt will be able to demonstrate ability to bend and pick up from floor without pain in order to demonstrate functional improvement in lumbopelvic function for self-care and house hold duties.    Time 4    Period Weeks    Status Partially Met               PT Long Term Goals - 05/14/21 1539       PT LONG TERM GOAL #1   Title Pt will become independent with final HEP in order to demonstrate synthesis of PT education.    Baseline 8    Period Weeks    Status On-going      PT LONG TERM GOAL #2   Title Pt will demonstrate at least a 12.8 improvement in Oswestry Index in order to demonstrate a clinically  significant change in LBP and function.    Time 8    Period Weeks    Status Partially Met      PT LONG TERM GOAL #3   Title Pt will be able to perform 5XSTS in under 12  in order to demonstrate functional improvement above the cut off score for older adults.    Time 8    Period Weeks    Status On-going      PT LONG TERM GOAL #4   Title Pt will demonstrate/report ability to stand/sit/sleep without pain in order to demonstrate functional improvement and tolerance to static positioning.    Time 8    Period Weeks    Status On-going                   Plan - 05/14/21 1420     Clinical Impression Statement Pt demonstrates mild improvement since initial evaluation but pt's recent decline in function is highly correlated with most recent extended  travel and inconsistency with HEP/therapy. Pt still with diffiuclty with functional transfers, gait, and L LE pain, though pt may have lower leg and knee pain related to previous knee surgery. Pt does present with mechanical symptoms at the knee that could be happening concurrently with lumbar radiculopathy. Pt to re-establish consistency with therapy and introduce aquatic therapy. HEP trimmed in order to promote better home performance/consistency. Pt would benefit from continued skilled therapy in order to reach goals and maximize functional mobility and prevent further functional decline.    Personal Factors and Comorbidities Age;Time since onset of injury/illness/exacerbation;Comorbidity 1;Comorbidity 2;Comorbidity 3+;Fitness    Examination-Activity Limitations Bathing;Locomotion Level;Transfers;Bend;Sit;Sleep;Carry;Squat;Stairs;Stand;Lift    Examination-Participation Restrictions Yard Work;Occupation;Community Activity;Driving    Stability/Clinical Decision Making Evolving/Moderate complexity    Rehab Potential Fair    PT Frequency 2x / week    PT Duration 8 weeks    PT Treatment/Interventions ADLs/Self Care Home Management;Aquatic  Therapy;Cryotherapy;Electrical Stimulation;Iontophoresis 67m/ml Dexamethasone;Moist Heat;Traction;Ultrasound;DME Instruction;Gait training;Stair training;Functional mobility training;Therapeutic activities;Therapeutic exercise;Balance training;Neuromuscular re-education;Patient/family education;Orthotic Fit/Training;Manual techniques;Scar mobilization;Passive range of motion;Dry needling;Taping;Vasopneumatic Device;Spinal Manipulations;Joint Manipulations    PT Home Exercise Plan Access Code: [email or web address]   Consulted and Agree with Plan of Care Patient;Family member/caregiver             Patient will benefit from skilled therapeutic intervention in order to improve the following deficits and impairments:  Abnormal gait, Pain, Improper body mechanics, Decreased mobility, Increased muscle spasms, Decreased activity tolerance, Decreased endurance, Decreased range of motion, Decreased strength  Visit Diagnosis: Pain, lumbar region  Muscle weakness (generalized)  Difficulty walking     Problem List Patient Active Problem List   Diagnosis Date Noted   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   COVID-19 virus infection 02/13/2021   Pain of left hip joint 12/25/2020   Lumbar post-laminectomy syndrome 11/27/2020   Ureteral stone 10/06/2020   Ureteral stone with hydronephrosis 10/06/2020   Lumbar radiculopathy 08/23/2020   Hypertension    Disc displacement, 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   Type 2 diabetes mellitus ([place] 02/17/2020   Status post bilateral knee replacements 06/07/2015   S/P bilateral unicompartmental knee replacement 03/01/2015   ADaleen BoPT, DPT 05/14/21 3:45 PM  [facility] [address] [address]Phone: [phone]  Fax:  [phone] Name: Ann SigmanMRN: [id number] of Birth: [birth year]

## 2021-05-16 ENCOUNTER — Ambulatory Visit: Payer: Medicare Other | Admitting: Physical Medicine and Rehabilitation

## 2021-05-20 ENCOUNTER — Other Ambulatory Visit: Payer: Self-pay | Admitting: Adult Health

## 2021-05-20 DIAGNOSIS — J302 Other seasonal allergic rhinitis: Secondary | ICD-10-CM

## 2021-05-21 ENCOUNTER — Other Ambulatory Visit: Payer: Self-pay

## 2021-05-21 ENCOUNTER — Encounter (HOSPITAL_BASED_OUTPATIENT_CLINIC_OR_DEPARTMENT_OTHER): Payer: Self-pay | Admitting: Physical Therapy

## 2021-05-21 ENCOUNTER — Ambulatory Visit (HOSPITAL_BASED_OUTPATIENT_CLINIC_OR_DEPARTMENT_OTHER): Payer: Medicare Other | Admitting: Physical Therapy

## 2021-05-21 DIAGNOSIS — M545 Low back pain, unspecified: Secondary | ICD-10-CM

## 2021-05-21 DIAGNOSIS — R262 Difficulty in walking, not elsewhere classified: Secondary | ICD-10-CM

## 2021-05-21 DIAGNOSIS — M6281 Muscle weakness (generalized): Secondary | ICD-10-CM

## 2021-05-21 NOTE — Therapy (Signed)
[facility] [address] [place], [place], [address] Phone: [phone]   Fax:  [phone]  Physical Therapy Treatment  Patient Details  Name: Ann Thompson MRN: [id number] Date of Birth: [birth year] Referring Provider (PT): Dorothyann Peng, NP   Encounter Date: 05/21/2021   PT End of Session - 05/21/21 1115     Visit Number 12    Number of Visits 17    Date for PT Re-Evaluation 05/28/21    Authorization Type Medicare    PT Start Time 1105    PT Stop Time 1145    PT Time Calculation (min) 40 min    Activity Tolerance Patient limited by pain    Behavior During Therapy WFL for tasks assessed/performed             Past Medical History:  Diagnosis Date   Arthritis    DM type 2 (diabetes mellitus, type 2) ([place])    GERD (gastroesophageal reflux disease)    History of kidney stones    Hyperlipemia    Hypertension    Left leg pain    Wears hearing aid in both ears     Past Surgical History:  Procedure Laterality Date   ABDOMINAL HYSTERECTOMY  1982   partial   [place]  07/12/2020   [facility]   [place] Right 10/06/2020   Procedure: CYSTOSCOPY WITH RETROGRADE PYELOGRAM/URETERAL STENT PLACEMENT;  Surgeon: Alexis Frock, MD;  Location: [facility];  Service: Urology;  Laterality: Right;   CYSTOSCOPY WITH RETROGRADE PYELOGRAM, URETEROSCOPY AND STENT PLACEMENT Right 10/26/2020   Procedure: CYSTOSCOPY WITH RETROGRADE PYELOGRAM, URETEROSCOPY AND STENT REPLACEMENT;  Surgeon: Alexis Frock, MD;  Location[facility];  Service: Urology;  Laterality: Right;  90 MINS   HOLMIUM LASER APPLICATION Right 4/97/0263   Procedure: HOLMIUM LASER APPLICATION;  Surgeon: Alexis Frock, MD;  Location: [facility];  Service: Urology;  Laterality: Right;   JOINT REPLACEMENT  2016   both knees partial replacement    There were no vitals  filed for this visit.   Subjective Assessment - 05/21/21 1108     Subjective Pt states the pain is a little better. She notices that she is walking easier. She has restarted HEP and is walkign easier.    Patient is accompained by: Family member   Daughter- Kieth Brightly   Pertinent History Surgery for herniated disc- no relief; injections; nerve block; restless leg syndrome    How long can you sit comfortably? 10-15 mins    How long can you stand comfortably? 70mns    How long can you walk comfortably? 20-30 mins    Patient Stated Goals Pt states she likes to golf and would love to golf.    Currently in Pain? No/denies    Pain Score 0-No pain    Pain Onset More than a month ago                  [facility] - 05/21/21 0001                                                              Posture/Postural Control   Posture/Postural Control Postural limitations    Postural Limitations Decreased  lumbar lordosis;Weight shift right;Flexed trunk      Exercises   Exercises Lumbar      Lumbar Exercises: Stretches   Lower Trunk Rotation Limitations 15x 3s    Pelvic Tilt Limitations 20x Ant and post          Lumbar Exercises: Aerobic   Nustep L5 6 min      Lumbar Exercises: Standing   Other Standing Lumbar Exercises STS 10x      Lumbar Exercises: Seated   Other Seated Lumbar Exercises seated lumbar flxion stretch 10s 10x      Lumbar Exercises: Supine   Other Supine Lumbar Exercises bridge 10x; McGill curl up 2s 10x    Other Supine Lumbar Exercises supine clam blue TB 2x10   5s hold                               PT Short Term Goals - 05/14/21 1539       PT SHORT TERM GOAL #1   Title Pt will become independent with HEP in order to demonstrate synthesis of PT education.    Time 4    Period Weeks    Status On-going      PT SHORT TERM GOAL #2   Title Pt will be able to demonstrate gait without AD in order to demonstrate  functional improvement in LE function for self-care and house hold duties.    Time 4    Period Weeks    Status Achieved      PT SHORT TERM GOAL #3   Title Pt will be able to demonstrate ability to bend and pick up from floor without pain in order to demonstrate functional improvement in lumbopelvic function for self-care and house hold duties.    Time 4    Period Weeks    Status Partially Met               PT Long Term Goals - 05/14/21 1539       PT LONG TERM GOAL #1   Title Pt will become independent with final HEP in order to demonstrate synthesis of PT education.    Baseline 8    Period Weeks    Status On-going      PT LONG TERM GOAL #2   Title Pt will demonstrate at least a 12.8 improvement in Oswestry Index in order to demonstrate a clinically significant change in LBP and function.    Time 8    Period Weeks    Status Partially Met      PT LONG TERM GOAL #3   Title Pt will be able to perform 5XSTS in under 12  in order to demonstrate functional improvement above the cut off score for older adults.    Time 8    Period Weeks    Status On-going      PT LONG TERM GOAL #4   Title Pt will demonstrate/report ability to stand/sit/sleep without pain in order to demonstrate functional improvement and tolerance to static positioning.    Time 8    Period Weeks    Status On-going                   Plan - 05/21/21 1117     Clinical Impression Statement Pt demonstrates good improvement with ROM and mobility exercise at today's session. Pt started with reduced pain as compared to last session and did not have increase in pain  with exercise. Pt did find slight L hip discomfort with STS but was able to complete 10x without increasing discomfort. Plan to transition to aquatic therapy to address land based deficits as pt is able depending on hearing aids. Pt would benefit from continued skilled therapy in order to reach goals and maximize functional mobility and prevent  further functional decline.    Personal Factors and Comorbidities Age;Time since onset of injury/illness/exacerbation;Comorbidity 1;Comorbidity 2;Comorbidity 3+;Fitness    Examination-Activity Limitations Bathing;Locomotion Level;Transfers;Bend;Sit;Sleep;Carry;Squat;Stairs;Stand;Lift    Examination-Participation Restrictions Yard Work;Occupation;Community Activity;Driving    Stability/Clinical Decision Making Evolving/Moderate complexity    Rehab Potential Fair    PT Frequency 2x / week    PT Duration 8 weeks    PT Treatment/Interventions ADLs/Self Care Home Management;Aquatic Therapy;Cryotherapy;Electrical Stimulation;Iontophoresis 29m/ml Dexamethasone;Moist Heat;Traction;Ultrasound;DME Instruction;Gait training;Stair training;Functional mobility training;Therapeutic activities;Therapeutic exercise;Balance training;Neuromuscular re-education;Patient/family education;Orthotic Fit/Training;Manual techniques;Scar mobilization;Passive range of motion;Dry needling;Taping;Vasopneumatic Device;Spinal Manipulations;Joint Manipulations    PT Next Visit Plan begin transition to aquatic therapy when pt is cleared by audiologist- waiting on information about getting hearing aids wet    PT Home Exercise Plan Access Code: [email or web address]    Consulted and Agree with Plan of Care Patient;Family member/caregiver             Patient will benefit from skilled therapeutic intervention in order to improve the following deficits and impairments:  Abnormal gait, Pain, Improper body mechanics, Decreased mobility, Increased muscle spasms, Decreased activity tolerance, Decreased endurance, Decreased range of motion, Decreased strength  Visit Diagnosis: Pain, lumbar region  Muscle weakness (generalized)  Difficulty walking     Problem List Patient Active Problem List   Diagnosis Date Noted   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   COVID-19 virus infection 02/13/2021   Pain of left hip joint 12/25/2020    Lumbar post-laminectomy syndrome 11/27/2020   Ureteral stone 10/06/2020   Ureteral stone with hydronephrosis 10/06/2020   Lumbar radiculopathy 08/23/2020   Hypertension    Disc displacement, 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   Type 2 diabetes mellitus ([place] 02/17/2020   Status post bilateral knee replacements 06/07/2015   S/P bilateral unicompartmental knee replacement 03/01/2015    ADaleen Bo PT 05/21/2021, 11:53 AM  [place] [address]Phone: [phone]  Fax:  [phone] Name: Ann FargoMRN: [id number] of Birth: 8Jan 31, 1942

## 2021-05-23 ENCOUNTER — Other Ambulatory Visit: Payer: Self-pay

## 2021-05-23 ENCOUNTER — Ambulatory Visit (HOSPITAL_BASED_OUTPATIENT_CLINIC_OR_DEPARTMENT_OTHER): Payer: Medicare Other | Admitting: Physical Therapy

## 2021-05-23 ENCOUNTER — Encounter (HOSPITAL_BASED_OUTPATIENT_CLINIC_OR_DEPARTMENT_OTHER): Payer: Self-pay | Admitting: Physical Therapy

## 2021-05-23 DIAGNOSIS — M545 Low back pain, unspecified: Secondary | ICD-10-CM

## 2021-05-23 DIAGNOSIS — M6281 Muscle weakness (generalized): Secondary | ICD-10-CM

## 2021-05-23 DIAGNOSIS — R262 Difficulty in walking, not elsewhere classified: Secondary | ICD-10-CM

## 2021-05-23 NOTE — Therapy (Addendum)
[employer] [address] [place], [place], [address] Phone: [phone]   Fax:  [phone]  Physical Therapy Treatment/ Discharge  Patient Details  Name: Ann Thompson MRN: [id number] Date of Birth: Jan 13, 1941 Referring Provider (PT): Dorothyann Peng, NP   Encounter Date: 05/23/2021   PT End of Session - 05/23/21 1202     Visit Number 13    Number of Visits 17    Date for PT Re-Evaluation 05/28/21    Authorization Type Medicare    PT Start Time 1150    PT Stop Time 1230    PT Time Calculation (min) 40 min    Activity Tolerance Patient limited by pain    Behavior During Therapy [facility] for tasks assessed/performed             Past Medical History:  Diagnosis Date   Arthritis    DM type 2 (diabetes mellitus, type 2) ([place])    GERD (gastroesophageal reflux disease)    History of kidney stones    Hyperlipemia    Hypertension    Left leg pain    Wears hearing aid in both ears     Past Surgical History:  Procedure Laterality Date   ABDOMINAL HYSTERECTOMY  1982   partial   [place]  07/12/2020   lower at [facility]   [place] Right 10/06/2020   Procedure: CYSTOSCOPY WITH RETROGRADE PYELOGRAM/URETERAL STENT PLACEMENT;  Surgeon: Alexis Frock, MD;  Location: [place];  Service: Urology;  Laterality: Right;   CYSTOSCOPY WITH RETROGRADE PYELOGRAM, URETEROSCOPY AND STENT PLACEMENT Right 10/26/2020   Procedure: CYSTOSCOPY WITH RETROGRADE PYELOGRAM, URETEROSCOPY AND STENT REPLACEMENT;  Surgeon: Alexis Frock, MD;  Location: [facility];  Service: Urology;  Laterality: Right;  90 MINS   HOLMIUM LASER APPLICATION Right 6/71/2458   Procedure: HOLMIUM LASER APPLICATION;  Surgeon: Alexis Frock, MD;  Location: [facility];  Service: Urology;  Laterality: Right;   JOINT REPLACEMENT  2016   both knees partial replacement    There were no  vitals filed for this visit.   Subjective Assessment - 05/23/21 1159     Subjective Pt states she is not feeling very good in general today. It is not back related. She states the back feels "decent" today.    Patient is accompained by: Family member   Daughter- Kieth Brightly   Pertinent History Surgery for herniated disc- no relief; injections; nerve block; restless leg syndrome    How long can you sit comfortably? 10-15 mins    How long can you stand comfortably? 104mns    How long can you walk comfortably? 20-30 mins    Patient Stated Goals Pt states she likes to golf and would love to golf.    Currently in Pain? No/denies    Pain Score 0-No pain    Pain Onset More than a month ago                               [facility]21 0001                    Posture/Postural Control   Posture/Postural Control Postural limitations    Postural Limitations Decreased lumbar lordosis;Weight shift right;Flexed trunk      Exercises   Exercises Lumbar      Lumbar Exercises: Stretches  Pelvic Tilt Limitations 20x Ant and post    Other Lumbar Stretch Exercise calf stretch (stopped due to pain)    Other Lumbar Stretch Exercise S/L quad stretch 30s 3x on L      Lumbar Exercises: Aerobic   Nustep L5 6 min               Lumbar Exercises: Seated   Other Seated Lumbar Exercises seated lumbar flxion stretch 10s 10x      Lumbar Exercises: Supine   Straight Leg Raises Limitations 3x5    Other Supine Lumbar Exercises bridge 10x; [place] curl up 2s 10x    Other Supine Lumbar Exercises nerve floss with ankle PF/DF 15x with 2s hold; supine 90/90 sciatic n. floss 10x      Manual Therapy   Joint Mobilization LAD grade II L LE                       PT Short Term Goals - 05/14/21 1539       PT SHORT TERM GOAL #1   Title Pt will become independent with HEP in order to demonstrate synthesis of PT education.    Time 4    Period  Weeks    Status On-going      PT SHORT TERM GOAL #2   Title Pt will be able to demonstrate gait without AD in order to demonstrate functional improvement in LE function for self-care and house hold duties.    Time 4    Period Weeks    Status Achieved      PT SHORT TERM GOAL #3   Title Pt will be able to demonstrate ability to bend and pick up from floor without pain in order to demonstrate functional improvement in lumbopelvic function for self-care and house hold duties.    Time 4    Period Weeks    Status Partially Met               PT Long Term Goals - 05/14/21 1539       PT LONG TERM GOAL #1   Title Pt will become independent with final HEP in order to demonstrate synthesis of PT education.    Baseline 8    Period Weeks    Status On-going      PT LONG TERM GOAL #2   Title Pt will demonstrate at least a 12.8 improvement in Oswestry Index in order to demonstrate a clinically significant change in LBP and function.    Time 8    Period Weeks    Status Partially Met      PT LONG TERM GOAL #3   Title Pt will be able to perform 5XSTS in under 12  in order to demonstrate functional improvement above the cut off score for older adults.    Time 8    Period Weeks    Status On-going      PT LONG TERM GOAL #4   Title Pt will demonstrate/report ability to stand/sit/sleep without pain in order to demonstrate functional improvement and tolerance to static positioning.    Time 8    Period Weeks    Status On-going                   Plan - 05/23/21 1246     Clinical Impression Statement Pt session kept on land today due to pt report of feeling unwell. Pt had improved gait and L hip ROM following manual therapy and  exercise. Pt session included new nerve gliding exercise due to continue L LE pain. Pt's calf pain is highly sensitive but was able to perform supine 90/90 glide without exacerbation. Pt HEP updated to include SLR to improve hip flexor strength but also to  promote nerve desensitization.  Pt would benefit from continued skilled therapy in order to reach goals and maximize functional mobility and prevent further functional decline.    Personal Factors and Comorbidities Age;Time since onset of injury/illness/exacerbation;Comorbidity 1;Comorbidity 2;Comorbidity 3+;Fitness    Examination-Activity Limitations Bathing;Locomotion Level;Transfers;Bend;Sit;Sleep;Carry;Squat;Stairs;Stand;Lift    Examination-Participation Restrictions Yard Work;Occupation;Community Activity;Driving    Stability/Clinical Decision Making Evolving/Moderate complexity    Rehab Potential Fair    PT Frequency 2x / week    PT Duration 8 weeks    PT Treatment/Interventions ADLs/Self Care Home Management;Aquatic Therapy;Cryotherapy;Electrical Stimulation;Iontophoresis 34m/ml Dexamethasone;Moist Heat;Traction;Ultrasound;DME Instruction;Gait training;Stair training;Functional mobility training;Therapeutic activities;Therapeutic exercise;Balance training;Neuromuscular re-education;Patient/family education;Orthotic Fit/Training;Manual techniques;Scar mobilization;Passive range of motion;Dry needling;Taping;Vasopneumatic Device;Spinal Manipulations;Joint Manipulations    PT Next Visit Plan begin transition to aquatic therapy when pt is cleared by audiologist- waiting on information about getting hearing aids wet    PT Home Exercise Plan Access Code: [email or web address]    Consulted and Agree with Plan of Care Patient;Family member/caregiver             Patient will benefit from skilled therapeutic intervention in order to improve the following deficits and impairments:  Abnormal gait, Pain, Improper body mechanics, Decreased mobility, Increased muscle spasms, Decreased activity tolerance, Decreased endurance, Decreased range of motion, Decreased strength  Visit Diagnosis: Pain, lumbar region  Muscle weakness (generalized)  Difficulty walking     Problem List Patient Active Problem List    Diagnosis Date Noted   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   COVID-19 virus infection 02/13/2021   Pain of left hip joint 12/25/2020   Lumbar post-laminectomy syndrome 11/27/2020   Ureteral stone 10/06/2020   Ureteral stone with hydronephrosis 10/06/2020   Lumbar radiculopathy 08/23/2020   Hypertension    Disc displacement, 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   [place] 2 diabetes mellitus ([place] 02/17/2020   Status post bilateral knee replacements 06/07/2015   S/P bilateral unicompartmental knee replacement 03/01/2015    ADaleen Bo PT 05/23/2021, 12:59 PM  [place]ushing[address] [place] [address]Phone: [phone]  Fax:  [phone] Name: BKerstie AgentMRN: [id number] of Birth: 804-Nov-1942 PHYSICAL THERAPY DISCHARGE SUMMARY  Visits from Start of Care: 13   Plan: Patient agrees to discharge.  Patient goals were not met. Patient is being discharged due to not returning to therapy.

## 2021-05-25 NOTE — Progress Notes (Signed)
Ann Thompson - 80 y.o. female MRN [id number]  Date of birth: 14-Aug-1941  Office Visit Note: Visit Date: 05/09/2021 PCP: [name], NP Referred by: [name], NP  Subjective: Chief Complaint  Patient presents with   Lower Back - Pain   Left Hip - Pain   Left Leg - Pain   HPI:  Ann Thompson is a 79 y.o. female who comes in today  for planned Left L5-S1 Lumbar Transforaminal epidural steroid injection with fluoroscopic guidance.  The patient has failed conservative care including home exercise, medications, time and activity modification.  This injection will be diagnostic and hopefully therapeutic.  Please see requesting physician notes for further details and justification.  I did discuss with her today the electrodiagnostic study from Dr. Murray Hodgkins was reviewed and it was normal which I did expect.  I also discussed with her injections that were performed by Dr. Ethelene Hal and we did review those notes.  He did complete an L5 transforaminal injection but was from a what I referred to as an infra neural approach.  We will try subcuticular approach today for her L5 radicular symptoms.   ROS Otherwise per HPI.  Assessment & Plan: Visit Diagnoses:    ICD-10-CM   1. Lumbar radiculopathy  M54.16 XR C-ARM NO REPORT    Epidural Steroid injection    methylPREDNISolone acetate (DEPO-MEDROL) injection 80 mg    2. Chronic right shoulder pain  M25.511 Ambulatory referral to Physical Therapy   G89.29       Plan: No additional findings.   Meds & Orders:  Meds ordered this encounter  Medications   methylPREDNISolone acetate (DEPO-MEDROL) injection 80 mg    Orders Placed This Encounter  Procedures   XR C-ARM NO REPORT   Ambulatory referral to Physical Therapy   Epidural Steroid injection    Follow-up: Return if symptoms worsen or fail to improve.   Procedures: No procedures performed  Lumbosacral Transforaminal Epidural Steroid Injection - Sub-Pedicular Approach with Fluoroscopic  Guidance  Patient: Ann Thompson      Date of Birth: Jan 14, 1941 MRN: [id number] PCP: [name], NP      Visit Date: 05/09/2021   Universal Protocol:    Date/Time: 05/09/2021  Consent Given By: the patient  Position: PRONE  Additional Comments: Vital signs were monitored before and after the procedure. Patient was prepped and draped in the usual sterile fashion. The correct patient, procedure, and site was verified.   Injection Procedure Details:   Procedure diagnoses: Lumbar radiculopathy [M54.16]    Meds Administered:  Meds ordered this encounter  Medications   methylPREDNISolone acetate (DEPO-MEDROL) injection 80 mg    Laterality: Left  Location/Site: L5  Needle:5.0 in., 22 ga.  Short bevel or Quincke spinal needle  Needle Placement: Transforaminal  Findings:    -Comments: Excellent flow of contrast along the nerve, nerve root and into the epidural space.  Procedure Details: After squaring off the end-plates to get a true AP view, the C-arm was positioned so that an oblique view of the foramen as noted above was visualized. The target area is just inferior to the "nose of the scotty dog" or sub pedicular. The soft tissues overlying this structure were infiltrated with 2-3 ml. of 1% Lidocaine without Epinephrine.  The spinal needle was inserted toward the target using a "trajectory" view along the fluoroscope beam.  Under AP and lateral visualization, the needle was advanced so it did not puncture dura and was located close the 6 O'Clock position of the pedical  in AP tracterory. Biplanar projections were used to confirm position. Aspiration was confirmed to be negative for CSF and/or blood. A 1-2 ml. volume of Isovue-250 was injected and flow of contrast was noted at each level. Radiographs were obtained for documentation purposes.   After attaining the desired flow of contrast documented above, a 0.5 to 1.0 ml test dose of 0.25% Marcaine was injected into each  respective transforaminal space.  The patient was observed for 90 seconds post injection.  After no sensory deficits were reported, and normal lower extremity motor function was noted,   the above injectate was administered so that equal amounts of the injectate were placed at each foramen (level) into the transforaminal epidural space.   Additional Comments:  No complications occurred Dressing: 2 x 2 sterile gauze and Band-Aid    Post-procedure details: Patient was observed during the procedure. Post-procedure instructions were reviewed.  Patient left the clinic in stable condition.    Clinical History: CLINICAL DATA:  Lumbar radiculopathy. Additional history provided by scanning technologist: Patient reports low back pain with left leg pain/weakness/numbness since July 2021. History of lumbar surgery.   EXAM: MRI LUMBAR SPINE WITHOUT AND WITH CONTRAST   CONTRAST:  74mL MULTIHANCE GADOBENATE DIMEGLUMINE 529 MG/ML IV SOLN     FINDINGS:   Alignment: Trace retrolisthesis at L1-L2, L2-L3 and L3-L4, unchanged.   Vertebrae: Vertebral body height is maintained. Trace degenerative endplate edema and enhancement at L4-L5. Additionally, there is edema and enhancement along the left greater than right L4-L5 facet joints, likely degenerative. Multilevel degenerative endplate irregularity with small Schmorl nodes. Moderate fatty degenerative endplate marrow signal at L5-S1.   Conus medullaris and cauda equina: Conus extends to the L1-L2 level. No signal abnormality within the visualized distal spinal cord.   Paraspinal and other soft tissues: Multiple bilateral cystic renal lesions more fully characterized on the prior CT abdomen/pelvis of 10/06/2020. Redemonstrated right nephrolithiasis. Postsurgical changes to the lower dorsal paraspinal soft tissues.   Disc levels:   L3-L4: Trace retrolisthesis. Disc bulge. Superimposed broad-based right subarticular to right extraforaminal  disc extrusion with slight caudal migration. Mild facet arthrosis. As before, the disc protrusion contributes to moderate right subarticular stenosis, encroaching upon the descending right L4 nerve root. Central canal patent. The disc protrusion also contributes to moderate right neural foraminal narrowing, unchanged. Mild left neural foraminal narrowing, unchanged.   L4-L5: Interval left laminectomy and discectomy. Disc bulge with endplate spurring. Superimposed small residual/recurrent left subarticular disc extrusion with slight caudal migration. Moderate facet arthrosis. Although improved, there is persistent left subarticular narrowing with the disc extrusion contacting and posteriorly displacing the descending left L5 nerve root (series 13, image 30). Minimal relative right subarticular and central canal narrowing. Moderate bilateral neural foraminal narrowing (progressed on the right).   L5-S1: Disc bulge with endplate spurring. Superimposed central disc protrusion slightly eccentric to the right. Mild-to-moderate facet arthrosis. As before, the disc protrusion contributes to mild bilateral subarticular narrowing without frank nerve root impingement. Central canal patent. Bilateral neural foraminal narrowing (moderate/severe right, moderate left).   IMPRESSION: Comparison is made to the prior lumbar spine MRI of 04/05/2020.   At L4-L5, there has been interval left laminectomy and discectomy. There is a small residual/recurrent left subarticular disc extrusion with slight caudal migration. Although improved, there is persistent left subarticular narrowing with the disc extrusion contacting and posteriorly displacing the descending left L5 nerve root. Multifactorial moderate bilateral neural foraminal narrowing (progressed on the right).   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   At L3-L4, a caudally migrated broad-based  right subarticular to right extraforaminal disc extrusion contributes to moderate right subarticular stenosis, encroaching upon the descending right L4 nerve root. It also contributes to moderate right neural foraminal narrowing.   At L5-S1, there is advanced disc degeneration. A small central disc protrusion contributes to mild bilateral subarticular narrowing without appreciable nerve root impingement. Multifactorial bilateral neural foraminal narrowing (moderate/severe right, moderate left).   Right nephrolithiasis.   Bilateral cystic renal lesions more fully characterized on the CT abdomen/pelvis of 10/06/2020.     Electronically Signed   By: Jackey Loge DO   On: 10/26/2020 08:30     Objective:  VS:  HT:    WT:   BMI:     BP:(!) 159/74  HR:75bpm  TEMP: ( )  RESP:  Physical Exam Vitals and nursing note reviewed.  Constitutional:      General: She is not in acute distress.    Appearance: Normal appearance. She is not ill-appearing.  HENT:     Head: Normocephalic and atraumatic.     Right Ear: External ear normal.     Left Ear: External ear normal.  Eyes:     Extraocular Movements: Extraocular movements intact.  Cardiovascular:     Rate and Rhythm: Normal rate.     Pulses: Normal pulses.  Pulmonary:     Effort: Pulmonary effort is normal. No respiratory distress.  Abdominal:     General: There is no distension.     Palpations: Abdomen is soft.  Musculoskeletal:        General: Tenderness present.     Cervical back: Neck supple.     Right lower leg: No edema.     Left lower leg: No edema.     Comments: Patient has good distal strength with no pain over the greater trochanters.  No clonus or focal weakness.  Skin:    Findings: No erythema, lesion or rash.  Neurological:     General: No focal deficit present.     Mental Status: She is alert and oriented to person, place, and time.     Sensory: No sensory deficit.     Motor: No weakness or abnormal muscle  tone.     Coordination: Coordination normal.  Psychiatric:        Mood and Affect: Mood normal.        Behavior: Behavior normal.     Imaging: No results found.

## 2021-05-25 NOTE — Procedures (Signed)
Lumbosacral Transforaminal Epidural Steroid Injection - Sub-Pedicular Approach with Fluoroscopic Guidance  Patient: Ann Thompson      Date of Birth: [birth year] MRN: [id number] PCP: [name], NP      Visit Date: 05/09/2021   Universal Protocol:    Date/Time: 05/09/2021  Consent Given By: the patient  Position: PRONE  Additional Comments: Vital signs were monitored before and after the procedure. Patient was prepped and draped in the usual sterile fashion. The correct patient, procedure, and site was verified.   Injection Procedure Details:   Procedure diagnoses: Lumbar radiculopathy [M54.16]    Meds Administered:  Meds ordered this encounter  Medications   methylPREDNISolone acetate (DEPO-MEDROL) injection 80 mg    Laterality: Left  Location/Site: L5  Needle:5.0 in., 22 ga.  Short bevel or Quincke spinal needle  Needle Placement: Transforaminal  Findings:    -Comments: Excellent flow of contrast along the nerve, nerve root and into the epidural space.  Procedure Details: After squaring off the end-plates to get a true AP view, the C-arm was positioned so that an oblique view of the foramen as noted above was visualized. The target area is just inferior to the "nose of the scotty dog" or sub pedicular. The soft tissues overlying this structure were infiltrated with 2-3 ml. of 1% Lidocaine without Epinephrine.  The spinal needle was inserted toward the target using a "trajectory" view along the fluoroscope beam.  Under AP and lateral visualization, the needle was advanced so it did not puncture dura and was located close the 6 O'Clock position of the pedical in AP tracterory. Biplanar projections were used to confirm position. Aspiration was confirmed to be negative for CSF and/or blood. A 1-2 ml. volume of Isovue-250 was injected and flow of contrast was noted at each level. Radiographs were obtained for documentation purposes.   After attaining the desired flow of  contrast documented above, a 0.5 to 1.0 ml test dose of 0.25% Marcaine was injected into each respective transforaminal space.  The patient was observed for 90 seconds post injection.  After no sensory deficits were reported, and normal lower extremity motor function was noted,   the above injectate was administered so that equal amounts of the injectate were placed at each foramen (level) into the transforaminal epidural space.   Additional Comments:  No complications occurred Dressing: 2 x 2 sterile gauze and Band-Aid    Post-procedure details: Patient was observed during the procedure. Post-procedure instructions were reviewed.  Patient left the clinic in stable condition.

## 2021-05-27 ENCOUNTER — Encounter: Payer: Self-pay | Admitting: Physical Medicine and Rehabilitation

## 2021-05-27 ENCOUNTER — Ambulatory Visit (INDEPENDENT_AMBULATORY_CARE_PROVIDER_SITE_OTHER): Payer: Medicare Other | Admitting: Physical Medicine and Rehabilitation

## 2021-05-27 ENCOUNTER — Other Ambulatory Visit: Payer: Self-pay

## 2021-05-27 VITALS — BP 150/80 | HR 73

## 2021-05-27 DIAGNOSIS — M5416 Radiculopathy, lumbar region: Secondary | ICD-10-CM | POA: Diagnosis not present

## 2021-05-27 DIAGNOSIS — R2240 Localized swelling, mass and lump, unspecified lower limb: Secondary | ICD-10-CM | POA: Insufficient documentation

## 2021-05-27 DIAGNOSIS — G8929 Other chronic pain: Secondary | ICD-10-CM

## 2021-05-27 DIAGNOSIS — M961 Postlaminectomy syndrome, not elsewhere classified: Secondary | ICD-10-CM

## 2021-05-27 DIAGNOSIS — M5116 Intervertebral disc disorders with radiculopathy, lumbar region: Secondary | ICD-10-CM

## 2021-05-27 DIAGNOSIS — M25552 Pain in left hip: Secondary | ICD-10-CM

## 2021-05-27 DIAGNOSIS — M25511 Pain in right shoulder: Secondary | ICD-10-CM

## 2021-05-27 MED ORDER — DIAZEPAM 5 MG PO TABS: ORAL_TABLET | ORAL | 0 refills | Status: DC

## 2021-05-27 NOTE — Progress Notes (Signed)
Ann Thompson - 80 y.o. female MRN [id number]  Date of birth: 22-Jan-1941  Office Visit Note: Visit Date: 05/27/2021 PCP: [name], NP Referred by: [name], NP  Subjective: Chief Complaint  Patient presents with   Lower Back - Pain   Left Leg - Pain   HPI: Ann Thompson is a 80 y.o. female who comes in today for evaluation of chronic, worsening and severe left hip pain radiating to anterior leg, knee and down to ankle.  Patient reports this pain has been chronic for several months.  Patient states pain is exacerbated by walking and activity, currently describes as soreness sensation and rates as 8 out of 10.  Patient states some relief of pain with rest, stretching exercises and Tylenol.  Patient had left L5 transforaminal epidural steroid injection on 05/09/2021.  Patient is a poor historian, states that the majority of her pain occurs in the mornings and by 11 AM feels that she has significant relief of pain and is able to function.  Patient does report that she feels like this injection does help to better alleviate her pain in the morning and also states that this is the only lumbar epidural injection that has given her any significant relief from pain..  Patient's lumbar MRI from February exhibits laminectomy and discectomy at L4-L5, persistent left subarticular narrowing with the disc extrusion contacting and displacing descending left L5 nerve root which has improved in comparison with MRI from 2021.  Patient has previously been treated by Dr. Callie Fielding and Dr. Sheran Luz where she received multiple lumbar epidural steroid injections without significant relief.  Patient states she has been taking Celebrex for quite some time, however she recently reports generalized itching, rash and bruising to bilateral lower extremities and verbalizes concern that this could be a side effect of the Celebrex.  Patient recently completed formal physical therapy at [facility], which  she states did not help to relieve her pain.  Overall, patient states she is doing better however continues to have pain.  Patient denies focal weakness, numbness and tingling.  Patient denies recent trauma or falls.  Patient also vocalizes chronic issues with right shoulder pain and discomfort.  Patient states that she would like to be evaluated for her right shoulder pain and is also inquiring about a possible right shoulder steroid injection.  Review of Systems  Musculoskeletal:  Positive for back pain.  Skin:        Pt reports generalized rash and itching. Pt also reports bruising to bilateral lower extremities.   Neurological:  Negative for tingling, tremors, focal weakness and weakness.  Otherwise per HPI.  Assessment & Plan: Visit Diagnoses:    ICD-10-CM   1. Lumbar radiculopathy  M54.16 Ambulatory referral to Physical Medicine Rehab    2. Radiculopathy due to lumbar intervertebral disc disorder  M51.16     3. Post laminectomy syndrome  M96.1     4. Pain in left hip  M25.552     5. Chronic right shoulder pain  M25.511    G89.29        Plan: Findings:  Chronic recalcitrant left-sided hip pain radiating to anterior leg, knee and down to ankle.  Patient's clinical presentation and exam are consistent with classic L5 pattern.  Patient did get some relief with previous left L5 transforaminal epidural steroid injection, especially in the mornings when her pain is most severe.  Patient has failed conservative modalities such as formal physical therapy and use of medications.  We  feel that the next step is to repeat left L5 transforaminal epidural steroid injection. Patient vocalizes anxiety related to injection, order for pre-procedure Valium placed today.  We discussed patient's MRI today with her in detail using images and spine model.  We also addressed patient's complaints of generalized itching/rash and bruising to bilateral lower extremities, we instructed her to stop taking the  Celebrex.  No evidence of rash noted upon exam, however she does have bruising to bilateral lower extremities.  Patient instructed to take Tylenol 500 mg by mouth 4 times a day.  We also encouraged patient to remain active and to perform stretching exercises at home as tolerated.  If patient gets good and sustained relief with lumbar epidural steroid injection we will continue to monitor, however if she continues to have pain we will consider possible hip or trochanteric bursitis injections.  We discussed patient's chronic right-sided shoulder pain.  Patient has not been evaluated by orthopedic specialist, we feel that patient should be evaluated by Dr. Dorene Grebe for further treatment.  We would be happy to perform diagnostic right glenohumeral steroid injection in the future if warranted.     Meds & Orders:  Meds ordered this encounter  Medications   diazepam (VALIUM) 5 MG tablet    Sig: Take one tablet by mouth with food one hour prior to procedure. May repeat 30 minutes prior if needed.    Dispense:  2 tablet    Refill:  0    Order Specific Question:   Supervising Provider    Answer:   Tyrell Antonio [[id number]]    Orders Placed This Encounter  Procedures   Ambulatory referral to Physical Medicine Rehab    Follow-up: Return in about 1 week (around 06/03/2021) for Left L5 transforaminal epidural steroid injection.   Procedures: No procedures performed      Clinical History: CLINICAL DATA:  Lumbar radiculopathy. Additional history provided by scanning technologist: Patient reports low back pain with left leg pain/weakness/numbness since July 2021. History of lumbar surgery.   EXAM: MRI LUMBAR SPINE WITHOUT AND WITH CONTRAST   CONTRAST:  56mL MULTIHANCE GADOBENATE DIMEGLUMINE 529 MG/ML IV SOLN     FINDINGS:   Alignment: Trace retrolisthesis at L1-L2, L2-L3 and L3-L4, unchanged.   Vertebrae: Vertebral body height is maintained. Trace degenerative endplate edema and  enhancement at L4-L5. Additionally, there is edema and enhancement along the left greater than right L4-L5 facet joints, likely degenerative. Multilevel degenerative endplate irregularity with small Schmorl nodes. Moderate fatty degenerative endplate marrow signal at L5-S1.   Conus medullaris and cauda equina: Conus extends to the L1-L2 level. No signal abnormality within the visualized distal spinal cord.   Paraspinal and other soft tissues: Multiple bilateral cystic renal lesions more fully characterized on the prior CT abdomen/pelvis of 10/06/2020. Redemonstrated right nephrolithiasis. Postsurgical changes to the lower dorsal paraspinal soft tissues.   Disc levels:   L3-L4: Trace retrolisthesis. Disc bulge. Superimposed broad-based right subarticular to right extraforaminal disc extrusion with slight caudal migration. Mild facet arthrosis. As before, the disc protrusion contributes to moderate right subarticular stenosis, encroaching upon the descending right L4 nerve root. Central canal patent. The disc protrusion also contributes to moderate right neural foraminal narrowing, unchanged. Mild left neural foraminal narrowing, unchanged.   L4-L5: Interval left laminectomy and discectomy. Disc bulge with endplate spurring. Superimposed small residual/recurrent left subarticular disc extrusion with slight caudal migration. Moderate facet arthrosis. Although improved, there is persistent left subarticular narrowing with the disc extrusion contacting and posteriorly displacing  the descending left L5 nerve root (series 13, image 30). Minimal relative right subarticular and central canal narrowing. Moderate bilateral neural foraminal narrowing (progressed on the right).   L5-S1: Disc bulge with endplate spurring. Superimposed central disc protrusion slightly eccentric to the right. Mild-to-moderate facet arthrosis. As before, the disc protrusion contributes to mild bilateral  subarticular narrowing without frank nerve root impingement. Central canal patent. Bilateral neural foraminal narrowing (moderate/severe right, moderate left).   IMPRESSION: Comparison is made to the prior lumbar spine MRI of 04/05/2020.   At L4-L5, there has been interval left laminectomy and discectomy. There is a small residual/recurrent left subarticular disc extrusion with slight caudal migration. Although improved, there is persistent left subarticular narrowing with the disc extrusion contacting and posteriorly displacing the descending left L5 nerve root. Multifactorial moderate bilateral neural foraminal narrowing (progressed on the right).   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   At L3-L4, a caudally migrated broad-based right subarticular to right extraforaminal disc extrusion contributes to moderate right subarticular stenosis, encroaching upon the descending right L4 nerve root. It also contributes to moderate right neural foraminal narrowing.   At L5-S1, there is advanced disc degeneration. A small central disc protrusion contributes to mild bilateral subarticular narrowing without appreciable nerve root impingement. Multifactorial bilateral neural foraminal narrowing (moderate/severe right, moderate left).   Right nephrolithiasis.   Bilateral cystic renal lesions more fully characterized on the CT abdomen/pelvis of 10/06/2020.     Electronically Signed   By: Jackey Loge DO   On: 10/26/2020 08:30   She reports that she quit smoking about 39 years ago. Her smoking use included cigarettes. She has a 25.00 pack-year smoking history. She has never used smokeless tobacco.  Recent Labs    06/22/20 1039 09/21/20 1338 12/20/20 1034  HGBA1C 6.7* 7.5* 6.9*    Objective:  VS:  HT:    WT:   BMI:     BP: (!) 150/80  HR:73bpm  TEMP: ( )  RESP:  Physical Exam HENT:     Head: Normocephalic and atraumatic.      Right Ear: Tympanic membrane normal.     Left Ear: Tympanic membrane normal.     Nose: Nose normal.     Mouth/Throat:     Mouth: Mucous membranes are moist.  Eyes:     Pupils: Pupils are equal, round, and reactive to light.  Cardiovascular:     Rate and Rhythm: Normal rate.     Pulses: Normal pulses.  Pulmonary:     Effort: Pulmonary effort is normal.  Abdominal:     General: Abdomen is flat. There is no distension.  Musculoskeletal:        General: Tenderness present.     Cervical back: Normal range of motion.     Comments: Pt is slow to rise from seated position to standing. Good lumbar range of motion. Strong distal strength without clonus, pain upon palpation of left greater trochanter. Sensation intact bilaterally. Dysesthesias noted to L5 dermatome. Walks independently, gait steady.   Limited and painful range of motion to right shoulder.  Sensation intact to bilateral upper extremities, good strength noted.    Skin:    General: Skin is warm and dry.     Capillary Refill: Capillary refill takes less than 2 seconds.     Findings: Bruising present.  Neurological:     General: No focal deficit present.     Mental Status: She is alert.  Psychiatric:  Mood and Affect: Mood normal.    Ortho Exam  Imaging: No results found.  Past Medical/Family/Surgical/Social History: Medications & Allergies reviewed per EMR, new medications updated. 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   Past Medical History:  Diagnosis Date   Arthritis    DM type 2 (diabetes mellitus, type 2) (HCC)    GERD (gastroesophageal reflux disease)    History of kidney stones    Hyperlipemia    Hypertension    Left leg pain    Wears hearing aid in both ears    Family History  Family history unknown: Yes   Past Surgical History:  Procedure Laterality Date   ABDOMINAL HYSTERECTOMY  1982   partial   APPENDECTOMY  1957   BACK SURGERY  07/12/2020   lower at [facility]   CYSTOSCOPY W/ URETERAL STENT PLACEMENT Right 10/06/2020   Procedure: CYSTOSCOPY WITH RETROGRADE PYELOGRAM/URETERAL STENT PLACEMENT;  Surgeon: Sebastian Ache, MD;  Location: [facility] OR;  Service: Urology;  Laterality: Right;   CYSTOSCOPY WITH RETROGRADE PYELOGRAM, URETEROSCOPY AND STENT PLACEMENT Right 10/26/2020   Procedure: CYSTOSCOPY WITH RETROGRADE PYELOGRAM, URETEROSCOPY AND STENT REPLACEMENT;  Surgeon: Sebastian Ache, MD;  Location: [facility];  Service: Urology;  Laterality: Right;  90 MINS   HOLMIUM LASER APPLICATION Right 10/26/2020   Procedure: HOLMIUM LASER APPLICATION;  Surgeon: Sebastian Ache, MD;  Location: [facility];  Service: Urology;  Laterality: Right;   JOINT REPLACEMENT  2016   both knees partial replacement   Social History   Occupational History   Not on file  Tobacco Use   Smoking status: Former    Packs/day: 1.00    Years: 25.00    Pack years: 25.00    Types: Cigarettes    Quit date: 09/08/1981    Years since quitting: 39.7   Smokeless tobacco: Never  Vaping Use   Vaping Use: Never used  Substance and Sexual Activity   Alcohol use: Never   Drug use: Never   Sexual activity: Not on file

## 2021-05-27 NOTE — Progress Notes (Signed)
Pt state lower back pain that travels down her left leg. Pt state when getting out of bed in the morning the pain gets worse. Pt state she takes pain meds to help ease her pain. Pt has hx of inj on 05/09/21 pt state it has helped sum.  Numeric Pain Rating Scale and Functional Assessment Average Pain 10 Pain Right Now 3 My pain is constant and aching Pain is worse with:  getting up in the morning out of bed Pain improves with: medication   In the last MONTH (on 0-10 scale) has pain interfered with the following?  1. General activity like being  able to carry out your everyday physical activities such as walking, climbing stairs, carrying groceries, or moving a chair?  Rating(4)  2. Relation with others like being able to carry out your usual social activities and roles such as  activities at home, at work and in your community. Rating(5)  3. Enjoyment of life such that you have  been bothered by emotional problems such as feeling anxious, depressed or irritable?  Rating(6)

## 2021-05-29 ENCOUNTER — Encounter: Payer: Self-pay | Admitting: Adult Health

## 2021-05-29 ENCOUNTER — Other Ambulatory Visit: Payer: Self-pay | Admitting: Adult Health

## 2021-05-29 DIAGNOSIS — H9203 Otalgia, bilateral: Secondary | ICD-10-CM

## 2021-05-30 ENCOUNTER — Ambulatory Visit: Payer: Self-pay

## 2021-05-30 ENCOUNTER — Ambulatory Visit (INDEPENDENT_AMBULATORY_CARE_PROVIDER_SITE_OTHER): Payer: Medicare Other | Admitting: Physical Medicine and Rehabilitation

## 2021-05-30 ENCOUNTER — Ambulatory Visit (HOSPITAL_BASED_OUTPATIENT_CLINIC_OR_DEPARTMENT_OTHER): Payer: Medicare Other | Admitting: Physical Therapy

## 2021-05-30 ENCOUNTER — Encounter: Payer: Self-pay | Admitting: Physical Medicine and Rehabilitation

## 2021-05-30 ENCOUNTER — Other Ambulatory Visit: Payer: Self-pay

## 2021-05-30 VITALS — BP 144/80 | HR 73

## 2021-05-30 DIAGNOSIS — M5416 Radiculopathy, lumbar region: Secondary | ICD-10-CM

## 2021-05-30 DIAGNOSIS — M961 Postlaminectomy syndrome, not elsewhere classified: Secondary | ICD-10-CM

## 2021-05-30 DIAGNOSIS — M5116 Intervertebral disc disorders with radiculopathy, lumbar region: Secondary | ICD-10-CM

## 2021-05-30 NOTE — Progress Notes (Signed)
Pt state lower back pain that travels down her left leg. Pt state getting out of bed in the morning makes the pain worse. Pt state she takes over the counter pain mesd to help ease her pain.   Numeric Pain Rating Scale and Functional Assessment Average Pain 7   In the last MONTH (on 0-10 scale) has pain interfered with the following?  1. General activity like being  able to carry out your everyday physical activities such as walking, climbing stairs, carrying groceries, or moving a chair?  Rating(10)   +Driver, -BT, -Dye Allergies.

## 2021-05-30 NOTE — Patient Instructions (Signed)

## 2021-06-07 NOTE — Progress Notes (Signed)
Ann Thompson - 80 y.o. female MRN [id number]  Date of birth: [birth year]  Office Visit Note: Visit Date: 05/30/2021 PCP: [name], NP Referred by: [name], NP  Subjective: Chief Complaint  Patient presents with   Lower Back - Pain   Left Leg - Pain   HPI:  Ann Thompson is a 80 y.o. female who comes in today for planned repeat Left L5-S1  Lumbar Transforaminal epidural steroid injection with fluoroscopic guidance.  The patient has failed conservative care including home exercise, medications, time and activity modification.  This injection will be diagnostic and hopefully therapeutic.  Please see requesting physician notes for further details and justification. Patient received more than 50% pain relief from prior injection.   Referring: Self-referral but she does see Dr. Marikay Alar at [facility]  ROS Otherwise per HPI.  Assessment & Plan: Visit Diagnoses:    ICD-10-CM   1. Lumbar radiculopathy  M54.16 XR C-ARM NO REPORT    Epidural Steroid injection    2. Radiculopathy due to lumbar intervertebral disc disorder  M51.16 XR C-ARM NO REPORT    Epidural Steroid injection    3. Post laminectomy syndrome  M96.1 XR C-ARM NO REPORT    Epidural Steroid injection      Plan: No additional findings.   Meds & Orders: No orders of the defined types were placed in this encounter.   Orders Placed This Encounter  Procedures   XR C-ARM NO REPORT   Epidural Steroid injection    Follow-up: Return if symptoms worsen or fail to improve.   Procedures: No procedures performed  Lumbosacral Transforaminal Epidural Steroid Injection - Sub-Pedicular Approach with Fluoroscopic Guidance  Patient: Ann Thompson      Date of Birth: October 04, 1940 MRN: [id number] PCP: [name], NP      Visit Date: 05/30/2021   Universal Protocol:    Date/Time: 05/30/2021  Consent Given By: the patient  Position: PRONE  Additional Comments: Vital signs were  monitored before and after the procedure. Patient was prepped and draped in the usual sterile fashion. The correct patient, procedure, and site was verified.   Injection Procedure Details:   Procedure diagnoses: Lumbar radiculopathy [M54.16]    Meds Administered: No orders of the defined types were placed in this encounter.   Laterality: Left  Location/Site: L5  Needle:5.0 in., 22 ga.  Short bevel or Quincke spinal needle  Needle Placement: Transforaminal  Findings:    -Comments: Excellent flow of contrast along the nerve, nerve root and into the epidural space.  Procedure Details: After squaring off the end-plates to get a true AP view, the C-arm was positioned so that an oblique view of the foramen as noted above was visualized. The target area is just inferior to the "nose of the scotty dog" or sub pedicular. The soft tissues overlying this structure were infiltrated with 2-3 ml. of 1% Lidocaine without Epinephrine.  The spinal needle was inserted toward the target using a "trajectory" view along the fluoroscope beam.  Under AP and lateral visualization, the needle was advanced so it did not puncture dura and was located close the 6 O'Clock position of the pedical in AP tracterory. Biplanar projections were used to confirm position. Aspiration was confirmed to be negative for CSF and/or blood. A 1-2 ml. volume of Isovue-250 was injected and flow of contrast was noted at each level. Radiographs were obtained for documentation purposes.   After attaining the desired flow of contrast documented above, a 0.5 to  1.0 ml test dose of 0.25% Marcaine was injected into each respective transforaminal space.  The patient was observed for 90 seconds post injection.  After no sensory deficits were reported, and normal lower extremity motor function was noted,   the above injectate was administered so that equal amounts of the injectate were placed at each foramen (level) into the transforaminal  epidural space.   Additional Comments:  The patient tolerated the procedure well Dressing: 2 x 2 sterile gauze and Band-Aid    Post-procedure details: Patient was observed during the procedure. Post-procedure instructions were reviewed.  Patient left the clinic in stable condition.     Clinical History: CLINICAL DATA:  Lumbar radiculopathy. Additional history provided by scanning technologist: Patient reports low back pain with left leg pain/weakness/numbness since July 2021. History of lumbar surgery.   EXAM: MRI LUMBAR SPINE WITHOUT AND WITH CONTRAST   CONTRAST:  32mL MULTIHANCE GADOBENATE DIMEGLUMINE 529 MG/ML IV SOLN     FINDINGS:   Alignment: Trace retrolisthesis at L1-L2, L2-L3 and L3-L4, unchanged.   Vertebrae: Vertebral body height is maintained. Trace degenerative endplate edema and enhancement at L4-L5. Additionally, there is edema and enhancement along the left greater than right L4-L5 facet joints, likely degenerative. Multilevel degenerative endplate irregularity with small Schmorl nodes. Moderate fatty degenerative endplate marrow signal at L5-S1.   Conus medullaris and cauda equina: Conus extends to the L1-L2 level. No signal abnormality within the visualized distal spinal cord.   Paraspinal and other soft tissues: Multiple bilateral cystic renal lesions more fully characterized on the prior CT abdomen/pelvis of 10/06/2020. Redemonstrated right nephrolithiasis. Postsurgical changes to the lower dorsal paraspinal soft tissues.   Disc levels:   L3-L4: Trace retrolisthesis. Disc bulge. Superimposed broad-based right subarticular to right extraforaminal disc extrusion with slight caudal migration. Mild facet arthrosis. As before, the disc protrusion contributes to moderate right subarticular stenosis, encroaching upon the descending right L4 nerve root. Central canal patent. The disc protrusion also contributes to moderate right neural foraminal  narrowing, unchanged. Mild left neural foraminal narrowing, unchanged.   L4-L5: Interval left laminectomy and discectomy. Disc bulge with endplate spurring. Superimposed small residual/recurrent left subarticular disc extrusion with slight caudal migration. Moderate facet arthrosis. Although improved, there is persistent left subarticular narrowing with the disc extrusion contacting and posteriorly displacing the descending left L5 nerve root (series 13, image 30). Minimal relative right subarticular and central canal narrowing. Moderate bilateral neural foraminal narrowing (progressed on the right).   L5-S1: Disc bulge with endplate spurring. Superimposed central disc protrusion slightly eccentric to the right. Mild-to-moderate facet arthrosis. As before, the disc protrusion contributes to mild bilateral subarticular narrowing without frank nerve root impingement. Central canal patent. Bilateral neural foraminal narrowing (moderate/severe right, moderate left).   IMPRESSION: Comparison is made to the prior lumbar spine MRI of 04/05/2020.   At L4-L5, there has been interval left laminectomy and discectomy. There is a small residual/recurrent left subarticular disc extrusion with slight caudal migration. Although improved, there is persistent left subarticular narrowing with the disc extrusion contacting and posteriorly displacing the descending left L5 nerve root. Multifactorial moderate bilateral neural foraminal narrowing (progressed on the right).   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   At L3-L4, a caudally migrated broad-based right subarticular to right extraforaminal disc extrusion contributes to moderate right subarticular stenosis, encroaching upon the descending right L4 nerve root. It also contributes to moderate right neural foraminal narrowing.   At L5-S1, there is advanced disc degeneration.  A small central  disc protrusion contributes to mild bilateral subarticular narrowing without appreciable nerve root impingement. Multifactorial bilateral neural foraminal narrowing (moderate/severe right, moderate left).   Right nephrolithiasis.   Bilateral cystic renal lesions more fully characterized on the CT abdomen/pelvis of 10/06/2020.     Electronically Signed   By: Jackey Loge DO   On: 10/26/2020 08:30     Objective:  VS:  HT:    WT:   BMI:     BP:(!) 144/80  HR:73bpm  TEMP: ( )  RESP:  Physical Exam Vitals and nursing note reviewed.  Constitutional:      General: She is not in acute distress.    Appearance: Normal appearance. She is not ill-appearing.  HENT:     Head: Normocephalic and atraumatic.     Right Ear: External ear normal.     Left Ear: External ear normal.  Eyes:     Extraocular Movements: Extraocular movements intact.  Cardiovascular:     Rate and Rhythm: Normal rate.     Pulses: Normal pulses.  Pulmonary:     Effort: Pulmonary effort is normal. No respiratory distress.  Abdominal:     General: There is no distension.     Palpations: Abdomen is soft.  Musculoskeletal:        General: Tenderness present.     Cervical back: Neck supple.     Right lower leg: No edema.     Left lower leg: No edema.     Comments: Patient has good distal strength with no pain over the greater trochanters.  No clonus or focal weakness.  Skin:    Findings: No erythema, lesion or rash.  Neurological:     General: No focal deficit present.     Mental Status: She is alert and oriented to person, place, and time.     Sensory: No sensory deficit.     Motor: No weakness or abnormal muscle tone.     Coordination: Coordination normal.  Psychiatric:        Mood and Affect: Mood normal.        Behavior: Behavior normal.     Imaging: No results found.

## 2021-06-07 NOTE — Procedures (Signed)
Lumbosacral Transforaminal Epidural Steroid Injection - Sub-Pedicular Approach with Fluoroscopic Guidance  Patient: Ann Thompson      Date of Birth: 1940/09/18 MRN: [id number] PCP: [name], NP      Visit Date: 05/30/2021   Universal Protocol:    Date/Time: 05/30/2021  Consent Given By: the patient  Position: PRONE  Additional Comments: Vital signs were monitored before and after the procedure. Patient was prepped and draped in the usual sterile fashion. The correct patient, procedure, and site was verified.   Injection Procedure Details:   Procedure diagnoses: Lumbar radiculopathy [M54.16]    Meds Administered: No orders of the defined types were placed in this encounter.   Laterality: Left  Location/Site: L5  Needle:5.0 in., 22 ga.  Short bevel or Quincke spinal needle  Needle Placement: Transforaminal  Findings:    -Comments: Excellent flow of contrast along the nerve, nerve root and into the epidural space.  Procedure Details: After squaring off the end-plates to get a true AP view, the C-arm was positioned so that an oblique view of the foramen as noted above was visualized. The target area is just inferior to the "nose of the scotty dog" or sub pedicular. The soft tissues overlying this structure were infiltrated with 2-3 ml. of 1% Lidocaine without Epinephrine.  The spinal needle was inserted toward the target using a "trajectory" view along the fluoroscope beam.  Under AP and lateral visualization, the needle was advanced so it did not puncture dura and was located close the 6 O'Clock position of the pedical in AP tracterory. Biplanar projections were used to confirm position. Aspiration was confirmed to be negative for CSF and/or blood. A 1-2 ml. volume of Isovue-250 was injected and flow of contrast was noted at each level. Radiographs were obtained for documentation purposes.   After attaining the desired flow of contrast documented above, a 0.5 to 1.0 ml  test dose of 0.25% Marcaine was injected into each respective transforaminal space.  The patient was observed for 90 seconds post injection.  After no sensory deficits were reported, and normal lower extremity motor function was noted,   the above injectate was administered so that equal amounts of the injectate were placed at each foramen (level) into the transforaminal epidural space.   Additional Comments:  The patient tolerated the procedure well Dressing: 2 x 2 sterile gauze and Band-Aid    Post-procedure details: Patient was observed during the procedure. Post-procedure instructions were reviewed.  Patient left the clinic in stable condition.

## 2021-06-13 ENCOUNTER — Ambulatory Visit (INDEPENDENT_AMBULATORY_CARE_PROVIDER_SITE_OTHER): Payer: Medicare Other | Admitting: Physical Medicine and Rehabilitation

## 2021-06-13 ENCOUNTER — Encounter: Payer: Self-pay | Admitting: Physical Medicine and Rehabilitation

## 2021-06-13 ENCOUNTER — Ambulatory Visit: Payer: Self-pay

## 2021-06-13 ENCOUNTER — Other Ambulatory Visit: Payer: Self-pay

## 2021-06-13 DIAGNOSIS — G8929 Other chronic pain: Secondary | ICD-10-CM | POA: Diagnosis not present

## 2021-06-13 DIAGNOSIS — M5416 Radiculopathy, lumbar region: Secondary | ICD-10-CM | POA: Diagnosis not present

## 2021-06-13 DIAGNOSIS — M961 Postlaminectomy syndrome, not elsewhere classified: Secondary | ICD-10-CM | POA: Diagnosis not present

## 2021-06-13 DIAGNOSIS — M25511 Pain in right shoulder: Secondary | ICD-10-CM | POA: Diagnosis not present

## 2021-06-13 DIAGNOSIS — M5116 Intervertebral disc disorders with radiculopathy, lumbar region: Secondary | ICD-10-CM

## 2021-06-13 MED ORDER — BUPIVACAINE HCL 0.5 % IJ SOLN
3.0000 mL | INTRAMUSCULAR | Status: AC | PRN
  Administered 2021-06-13: 3 mL via INTRA_ARTICULAR

## 2021-06-13 MED ORDER — TRIAMCINOLONE ACETONIDE 40 MG/ML IJ SUSP
60.0000 mg | INTRAMUSCULAR | Status: AC | PRN
  Administered 2021-06-13: 60 mg via INTRA_ARTICULAR

## 2021-06-13 NOTE — Progress Notes (Signed)
Pt state right shoulder pain. Pt state when she moves her shoulder she feels pain. Pt state she takes pain meds to help ease her pain.  Numeric Pain Rating Scale and Functional Assessment Average Pain 4   In the last MONTH (on 0-10 scale) has pain interfered with the following?  1. General activity like being  able to carry out your everyday physical activities such as walking, climbing stairs, carrying groceries, or moving a chair?  Rating(9)   -BT, -Dye Allergies.

## 2021-06-13 NOTE — Progress Notes (Signed)
[name] - 80 y.o. female MRN [id number]  Date of birth: [birth year]  Office Visit Note: Visit Date: 06/13/2021 PCP: [name], NP Referred by: [name], NP  Subjective: Chief Complaint  Patient presents with   Right Shoulder - Pain   HPI:  Ann Thompson is a 80 y.o. female who comes in today For evaluation and management of continued left radicular leg pain in a pretty classic L5 distribution as well as right shoulder pain.  In terms of her shoulder she has had injections in the past and therapy in the past when she lived at [facility].  We had talked to her briefly about this in the past about consultation with Dr. Burnard Bunting in the office as well as possible intra-articular glenohumeral joint injection fluoroscopic guidance.  We did decide to do the injection today depending on relief she will follow-up for consultation with Dr. August Saucer.  She also has some minor knee issues as well.  In terms of her back left hip and leg pain is consistently in L5 radiculopathy.  She has had laminectomy decompression but with residual disc extrusion and lateral recess narrowing on the left.  Prior injections were not very beneficial.  I did complete a left L5 transforaminal injection actually did give her some relief temporarily but the last injection which was a repeat has offered no relief at all.  Her options at this point are to return to see Dr. Yetta Barre to see if there is anything they can do from a surgical standpoint whether that reexplore microdiscectomy or otherwise.  I also briefly spoke with her about spinal cord stimulators and the stimulator trial.  I think she would be a reasonable candidate but I think she really needs to follow-up with Dr. Yetta Barre for further management at this point.  We did talk about her current use of Celebrex and she is going to continue with that.  I did offer to refill that for her if she does not have any at home.  She has some other medic some questions and we did  answer and I have asked her to follow-up with her primary care physician or provider Duane Lope, NP  Review of Systems  Musculoskeletal:  Positive for back pain and joint pain.  Neurological:  Positive for tingling.  All other systems reviewed and are negative. Otherwise per HPI.  Assessment & Plan: Visit Diagnoses:    ICD-10-CM   1. Chronic right shoulder pain  M25.511 XR C-ARM NO REPORT   G89.29     2. Lumbar radiculopathy  M54.16 Ambulatory referral to Neurosurgery    3. Radiculopathy due to lumbar intervertebral disc disorder  M51.16 Ambulatory referral to Neurosurgery    4. Post laminectomy syndrome  M96.1 Ambulatory referral to Neurosurgery      Plan: Findings:  Chronic worsening right shoulder pain with plan as dictated in the HPI.  Intra-articular injection performed today.  She did have increase in range of motion postprocedure.  Depending on relief we will follow-up with consultation with Dr. August Saucer.  In terms of her left leg pain I do feel like this is an L5 radiculopathy.  Injections have just not been beneficial. Injection was beneficial for short iron.  I think she would benefit from returning to see Dr. Yetta Barre to see if there is anything surgically that could be done if it was a minor standpoint otherwise we did discuss spinal cord stimulators.   Meds & Orders: No orders of the defined types  were placed in this encounter.   Orders Placed This Encounter  Procedures   Large Joint Inj: R glenohumeral   XR C-ARM NO REPORT   Ambulatory referral to Neurosurgery    Follow-up: Return if symptoms worsen or fail to improve.   Procedures: Large Joint Inj: R glenohumeral on 06/13/2021 10:47 AM Indications: pain and diagnostic evaluation Details: 22 G 3.5 in needle, fluoroscopy-guided anteromedial approach  Arthrogram: No  Medications: 3 mL bupivacaine 0.5 %; 60 mg triamcinolone acetonide 40 MG/ML Outcome: tolerated well, no immediate complications  There was excellent  flow of contrast producing a partial arthrogram of the glenohumeral joint. The patient did have relief of symptoms during the anesthetic phase of the injection. Procedure, treatment alternatives, risks and benefits explained, specific risks discussed. Consent was given by the patient. Immediately prior to procedure a time out was called to verify the correct patient, procedure, equipment, support staff and site/side marked as required. Patient was prepped and draped in the usual sterile fashion.         Clinical History: CLINICAL DATA:  Lumbar radiculopathy. Additional history provided by scanning technologist: Patient reports low back pain with left leg pain/weakness/numbness since July 2021. History of lumbar surgery.   EXAM: MRI LUMBAR SPINE WITHOUT AND WITH CONTRAST   CONTRAST:  52mL MULTIHANCE GADOBENATE DIMEGLUMINE 529 MG/ML IV SOLN     FINDINGS:   Alignment: Trace retrolisthesis at L1-L2, L2-L3 and L3-L4, unchanged.   Vertebrae: Vertebral body height is maintained. Trace degenerative endplate edema and enhancement at L4-L5. Additionally, there is edema and enhancement along the left greater than right L4-L5 facet joints, likely degenerative. Multilevel degenerative endplate irregularity with small Schmorl nodes. Moderate fatty degenerative endplate marrow signal at L5-S1.   Conus medullaris and cauda equina: Conus extends to the L1-L2 level. No signal abnormality within the visualized distal spinal cord.   Paraspinal and other soft tissues: Multiple bilateral cystic renal lesions more fully characterized on the prior CT abdomen/pelvis of 10/06/2020. Redemonstrated right nephrolithiasis. Postsurgical changes to the lower dorsal paraspinal soft tissues.   Disc levels:   L3-L4: Trace retrolisthesis. Disc bulge. Superimposed broad-based right subarticular to right extraforaminal disc extrusion with slight caudal migration. Mild facet arthrosis. As before, the  disc protrusion contributes to moderate right subarticular stenosis, encroaching upon the descending right L4 nerve root. Central canal patent. The disc protrusion also contributes to moderate right neural foraminal narrowing, unchanged. Mild left neural foraminal narrowing, unchanged.   L4-L5: Interval left laminectomy and discectomy. Disc bulge with endplate spurring. Superimposed small residual/recurrent left subarticular disc extrusion with slight caudal migration. Moderate facet arthrosis. Although improved, there is persistent left subarticular narrowing with the disc extrusion contacting and posteriorly displacing the descending left L5 nerve root (series 13, image 30). Minimal relative right subarticular and central canal narrowing. Moderate bilateral neural foraminal narrowing (progressed on the right).   L5-S1: Disc bulge with endplate spurring. Superimposed central disc protrusion slightly eccentric to the right. Mild-to-moderate facet arthrosis. As before, the disc protrusion contributes to mild bilateral subarticular narrowing without frank nerve root impingement. Central canal patent. Bilateral neural foraminal narrowing (moderate/severe right, moderate left).   IMPRESSION: Comparison is made to the prior lumbar spine MRI of 04/05/2020.   At L4-L5, there has been interval left laminectomy and discectomy. There is a small residual/recurrent left subarticular disc extrusion with slight caudal migration. Although improved, there is persistent left subarticular narrowing with the disc extrusion contacting and posteriorly displacing the descending left L5 nerve root. Multifactorial moderate bilateral  neural foraminal narrowing (progressed on the right).   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   At L3-L4, a caudally migrated broad-based right subarticular to right extraforaminal disc extrusion contributes to moderate  right subarticular stenosis, encroaching upon the descending right L4 nerve root. It also contributes to moderate right neural foraminal narrowing.   At L5-S1, there is advanced disc degeneration. A small central disc protrusion contributes to mild bilateral subarticular narrowing without appreciable nerve root impingement. Multifactorial bilateral neural foraminal narrowing (moderate/severe right, moderate left).   Right nephrolithiasis.   Bilateral cystic renal lesions more fully characterized on the CT abdomen/pelvis of 10/06/2020.     Electronically Signed   By: Jackey Loge DO   On: 10/26/2020 08:30     Objective:  VS:  HT:    WT:   BMI:     BP:   HR: bpm  TEMP: ( )  RESP:  Physical Exam Vitals and nursing note reviewed.  Constitutional:      General: She is not in acute distress.    Appearance: Normal appearance. She is obese. She is not ill-appearing.  HENT:     Head: Normocephalic and atraumatic.     Right Ear: External ear normal.     Left Ear: External ear normal.  Eyes:     Extraocular Movements: Extraocular movements intact.  Cardiovascular:     Rate and Rhythm: Normal rate.     Pulses: Normal pulses.  Pulmonary:     Effort: Pulmonary effort is normal. No respiratory distress.  Abdominal:     General: There is no distension.     Palpations: Abdomen is soft.  Musculoskeletal:        General: Tenderness present.     Cervical back: Neck supple.     Right lower leg: No edema.     Left lower leg: No edema.     Comments: Patient has good distal strength with no pain over the greater trochanters.  No clonus or focal weakness.  She does have a positive slump test on the left.  She has some dysesthesia in L5 distribution on the left.  She has decreased range of motion of the right shoulder with impingement.  Skin:    General: Skin is warm and dry.     Findings: No erythema, lesion or rash.  Neurological:     General: No focal deficit present.     Mental  Status: She is alert and oriented to person, place, and time.     Sensory: No sensory deficit.     Motor: No weakness or abnormal muscle tone.     Coordination: Coordination normal.  Psychiatric:        Mood and Affect: Mood normal.        Behavior: Behavior normal.     Imaging: XR C-ARM NO REPORT  Result Date: 06/13/2021 Please see Notes tab for imaging impression.

## 2021-06-14 ENCOUNTER — Other Ambulatory Visit: Payer: Self-pay | Admitting: Adult Health

## 2021-06-14 DIAGNOSIS — E118 Type 2 diabetes mellitus with unspecified complications: Secondary | ICD-10-CM

## 2021-06-20 ENCOUNTER — Other Ambulatory Visit: Payer: Self-pay

## 2021-06-21 ENCOUNTER — Encounter: Payer: Self-pay | Admitting: Adult Health

## 2021-06-21 ENCOUNTER — Ambulatory Visit (INDEPENDENT_AMBULATORY_CARE_PROVIDER_SITE_OTHER): Payer: Medicare Other | Admitting: Adult Health

## 2021-06-21 ENCOUNTER — Other Ambulatory Visit: Payer: Self-pay | Admitting: Adult Health

## 2021-06-21 VITALS — BP 140/70 | HR 80 | Temp 97.9°F | Ht 60.0 in | Wt 107.0 lb

## 2021-06-21 DIAGNOSIS — E118 Type 2 diabetes mellitus with unspecified complications: Secondary | ICD-10-CM

## 2021-06-21 DIAGNOSIS — G2581 Restless legs syndrome: Secondary | ICD-10-CM

## 2021-06-21 DIAGNOSIS — I10 Essential (primary) hypertension: Secondary | ICD-10-CM | POA: Diagnosis not present

## 2021-06-21 DIAGNOSIS — H6983 Other specified disorders of Eustachian tube, bilateral: Secondary | ICD-10-CM

## 2021-06-21 LAB — POCT GLYCOSYLATED HEMOGLOBIN (HGB A1C): Hemoglobin A1C: 7.7 % — AB (ref 4.0–5.6)

## 2021-06-21 NOTE — Progress Notes (Signed)
Subjective:    Patient ID: Ann Thompson, female    DOB: 28-Dec-1940, 80 y.o.   MRN: [id number]  HPI 80 year old female who  has a past medical history of Arthritis, DM type 2 (diabetes mellitus, type 2) (HCC), GERD (gastroesophageal reflux disease), History of kidney stones, Hyperlipemia, Hypertension, Left leg pain, and Wears hearing aid in both ears.  She presents to the office today for six month follow up regarding DM and HTN   HTN - currently prescribed Ziac 2.5-6.25 m daily and Dyazide 37.5-25 mg daily. She denies dizziness, lightheadedness, chest pain, or shortness of breath.  BP Readings from Last 3 Encounters:  06/21/21 140/70  05/30/21 (!) 144/80  05/27/21 (!) 150/80   DM II - Takes Januvia 25 mg daily. Does not check her blood sugars at home. Denies episodes of hypoglycemia   Lab Results  Component Value Date   HGBA1C 6.9 (A) 12/20/2020   Additionally, over the last week she has been experiencing the feeling of bilateral ear fullness and mild headaches. She denies fevers, chills, sinus pain or pressure, ear pain, or drainage.   Review of Systems See HPI   Past Medical History:  Diagnosis Date   Arthritis    DM type 2 (diabetes mellitus, type 2) (HCC)    GERD (gastroesophageal reflux disease)    History of kidney stones    Hyperlipemia    Hypertension    Left leg pain    Wears hearing aid in both ears     Social History   Socioeconomic History   Marital status: Married    Spouse name: Not on file   Number of children: Not on file   Years of education: Not on file   Highest education level: Not on file  Occupational History   Not on file  Tobacco Use   Smoking status: Former    Packs/day: 1.00    Years: 25.00    Pack years: 25.00    Types: Cigarettes    Quit date: 09/08/1981    Years since quitting: 39.8   Smokeless tobacco: Never  Vaping Use   Vaping Use: Never used  Substance and Sexual Activity   Alcohol use: Never   Drug use: Never   Sexual  activity: Not on file  Other Topics Concern   Not on file  Social History Narrative   Not on file   Social Determinants of Health   Financial Resource Strain: Low Risk    Difficulty of Paying Living Expenses: Not hard at all  Food Insecurity: No Food Insecurity   Worried About Programme researcher, broadcasting/film/video in the Last Year: Never true   Ran Out of Food in the Last Year: Never true  Transportation Needs: No Transportation Needs   Lack of Transportation (Medical): No   Lack of Transportation (Non-Medical): No  Physical Activity: Inactive   Days of Exercise per Week: 0 days   Minutes of Exercise per Session: 0 min  Stress: No Stress Concern Present   Feeling of Stress : Not at all  Social Connections: Moderately Isolated   Frequency of Communication with Friends and Family: Twice a week   Frequency of Social Gatherings with Friends and Family: Twice a week   Attends Religious Services: Never   Database administrator or Organizations: No   Attends Engineer, structural: Never   Marital Status: Married  Catering manager Violence: Not At Risk   Fear of Current or Ex-Partner: No   Emotionally Abused:  No   Physically Abused: No   Sexually Abused: No    Past Surgical History:  Procedure Laterality Date   ABDOMINAL HYSTERECTOMY  1982   partial   APPENDECTOMY  1957   BACK SURGERY  07/12/2020   lower at [facility]   CYSTOSCOPY W/ URETERAL STENT PLACEMENT Right 10/06/2020   Procedure: CYSTOSCOPY WITH RETROGRADE PYELOGRAM/URETERAL STENT PLACEMENT;  Surgeon: Sebastian Ache, MD;  Location: [facility] OR;  Service: Urology;  Laterality: Right;   CYSTOSCOPY WITH RETROGRADE PYELOGRAM, URETEROSCOPY AND STENT PLACEMENT Right 10/26/2020   Procedure: CYSTOSCOPY WITH RETROGRADE PYELOGRAM, URETEROSCOPY AND STENT REPLACEMENT;  Surgeon: Sebastian Ache, MD;  Location: [facility];  Service: Urology;  Laterality: Right;  90 MINS   HOLMIUM LASER APPLICATION Right 10/26/2020    Procedure: HOLMIUM LASER APPLICATION;  Surgeon: Sebastian Ache, MD;  Location: [facility];  Service: Urology;  Laterality: Right;   JOINT REPLACEMENT  2016   both knees partial replacement    Family History  Family history unknown: Yes    No Known Allergies  Current Outpatient Medications on File Prior to Visit  Medication Sig Dispense Refill   acetaminophen (TYLENOL) 500 MG tablet Take 1,000 mg by mouth every 6 (six) hours as needed.     bisoprolol-hydrochlorothiazide (ZIAC) 2.5-6.25 MG tablet Take 1 tablet by mouth daily. 90 tablet 3   celecoxib (CELEBREX) 200 MG capsule celecoxib 200 mg capsule  Take 1 capsule every day by oral route.     cycloSPORINE (RESTASIS) 0.05 % ophthalmic emulsion Restasis 0.05 % eye drops in a dropperette  Instill 1 drop IN EACH EYE EVERY TWELVE HOURS     diazepam (VALIUM) 5 MG tablet Take one tablet by mouth with food one hour prior to procedure. May repeat 30 minutes prior if needed. 2 tablet 0   fluticasone (FLONASE) 50 MCG/ACT nasal spray USE 2 SPRAYS IN EACH NOSTRIL EVERY DAY 16 g 5   gabapentin (NEURONTIN) 100 MG capsule gabapentin 100 mg capsule  Take 1 capsule 3 times a day by oral route.     Glucosamine-Chondroit-Vit C-Mn (GLUCOSAMINE 1500 COMPLEX) CAPS Take 1 capsule by mouth daily.     JANUVIA 25 MG tablet TAKE 1 TABLET BY MOUTH DAILY 90 tablet 1   MAGNESIUM-POTASSIUM PO Take 1 tablet by mouth daily.     Multiple Vitamin (MULTIVITAMIN) tablet Take 1 tablet by mouth daily.     Tetrahydrozoline-Zn Sulfate (EYE DROPS A/C OP) Apply 1 drop to eye daily as needed (dry eyes).     triamterene-hydrochlorothiazide (MAXZIDE-25) 37.5-25 MG tablet TAKE 1 TABLET BY MOUTH EVERY DAY 90 tablet 2   UNABLE TO FIND Med Name: cbd oil to knees prn     No current facility-administered medications on file prior to visit.    BP 140/70   Pulse 80   Temp 97.9 F (36.6 C) (Oral)   Ht 5' (1.524 m)   Wt 107 lb (48.5 kg)   SpO2 96%   BMI 20.90 kg/m        Objective:   Physical Exam Vitals and nursing note reviewed.  Constitutional:      Appearance: Normal appearance.  HENT:     Right Ear: Ear canal normal. A middle ear effusion is present. Tympanic membrane is not erythematous or bulging.     Left Ear: Ear canal normal. A middle ear effusion is present. Tympanic membrane is not erythematous or bulging.     Nose:     Right Turbinates: Not enlarged or  swollen.     Left Turbinates: Not enlarged or swollen.     Right Sinus: No maxillary sinus tenderness or frontal sinus tenderness.     Left Sinus: No maxillary sinus tenderness or frontal sinus tenderness.  Cardiovascular:     Rate and Rhythm: Normal rate and regular rhythm.     Pulses: Normal pulses.     Heart sounds: Normal heart sounds.  Pulmonary:     Effort: Pulmonary effort is normal.     Breath sounds: Normal breath sounds.  Musculoskeletal:        General: Normal range of motion.  Skin:    General: Skin is warm and dry.     Capillary Refill: Capillary refill takes less than 2 seconds.  Neurological:     General: No focal deficit present.     Mental Status: She is alert and oriented to person, place, and time.  Psychiatric:        Mood and Affect: Mood normal.        Behavior: Behavior normal.        Thought Content: Thought content normal.        Judgment: Judgment normal.      Assessment & Plan:  1. Controlled type 2 diabetes mellitus with complication, without long-term current use of insulin (HCC)  - POC HgB A1c- 7.7 -no longer at goal  - Will have her increase Januvia to 50mg   - Follow up in three months for CPE   2. Primary hypertension - Controlled.  - No change in medications  3. Eustachian tube dysfunction, bilateral - Continue Flonase - Can use Afrin for the next three days - Send mychart message if not iproving   , NP

## 2021-06-25 ENCOUNTER — Other Ambulatory Visit: Payer: Self-pay | Admitting: Adult Health

## 2021-06-25 ENCOUNTER — Encounter: Payer: Self-pay | Admitting: Physical Medicine and Rehabilitation

## 2021-06-25 DIAGNOSIS — H6983 Other specified disorders of Eustachian tube, bilateral: Secondary | ICD-10-CM

## 2021-06-30 ENCOUNTER — Other Ambulatory Visit: Payer: Self-pay | Admitting: Orthopaedic Surgery

## 2021-06-30 MED ORDER — TRAMADOL HCL 50 MG PO TABS: 50.0000 mg | ORAL_TABLET | Freq: Four times a day (QID) | ORAL | 0 refills | Status: DC | PRN

## 2021-07-04 ENCOUNTER — Other Ambulatory Visit: Payer: Self-pay | Admitting: Adult Health

## 2021-07-08 ENCOUNTER — Encounter: Payer: Self-pay | Admitting: Physical Medicine and Rehabilitation

## 2021-07-10 ENCOUNTER — Other Ambulatory Visit: Payer: Self-pay | Admitting: Neurological Surgery

## 2021-07-10 ENCOUNTER — Telehealth: Payer: Self-pay | Admitting: Physical Medicine and Rehabilitation

## 2021-07-10 DIAGNOSIS — M5416 Radiculopathy, lumbar region: Secondary | ICD-10-CM

## 2021-07-10 NOTE — Telephone Encounter (Signed)
OV

## 2021-07-10 NOTE — Telephone Encounter (Signed)
Patient's husband called. She would like an appointment with Dr. Alvester Morin.

## 2021-07-10 NOTE — Telephone Encounter (Signed)
Scheduled for OV. 

## 2021-07-11 ENCOUNTER — Encounter: Payer: Self-pay | Admitting: Physical Medicine and Rehabilitation

## 2021-07-11 ENCOUNTER — Ambulatory Visit (INDEPENDENT_AMBULATORY_CARE_PROVIDER_SITE_OTHER): Payer: Medicare Other | Admitting: Physical Medicine and Rehabilitation

## 2021-07-11 ENCOUNTER — Other Ambulatory Visit: Payer: Self-pay

## 2021-07-11 ENCOUNTER — Other Ambulatory Visit: Payer: Self-pay | Admitting: Adult Health

## 2021-07-11 VITALS — BP 151/60 | HR 81

## 2021-07-11 DIAGNOSIS — M5416 Radiculopathy, lumbar region: Secondary | ICD-10-CM

## 2021-07-11 DIAGNOSIS — G894 Chronic pain syndrome: Secondary | ICD-10-CM | POA: Diagnosis not present

## 2021-07-11 DIAGNOSIS — M961 Postlaminectomy syndrome, not elsewhere classified: Secondary | ICD-10-CM | POA: Diagnosis not present

## 2021-07-11 DIAGNOSIS — M5116 Intervertebral disc disorders with radiculopathy, lumbar region: Secondary | ICD-10-CM

## 2021-07-11 NOTE — Progress Notes (Signed)
Ann Thompson - 80 y.o. female MRN [id number]  Date of birth: July 11, 1941  Office Visit Note: Visit Date: 07/11/2021 PCP: [name], NP Referred by: [name], NP  Subjective: Chief Complaint  Patient presents with   Lower Back - Pain   Left Leg - Pain   HPI: Ann Thompson is a 80 y.o. female who comes in today for evaluation of chronic, worsening and severe bilateral lower back pain radiating to left leg. Patient reports her pain has been chronic for several years.  Our prior notes can be reviewed in detail.  Patient states pain is exacerbated by walking and activity. Patient states her pain is most severe in the mornings when getting out of bed. Patient describes pain as a soreness and shooting sensation, currently rates as 9 out of 10.  Patient reports some relief of pain at home with rest, ice/heat, TENS unit and medications as needed.  Patient's lumbar MRI from February of this year exhibits left laminectomy and discectomy at L4-L5, there is also a small residual left subarticular disc extrusion contacting the descending L5 nerve root at this same level. Patient states she is in severe pain today and "needs something done before she goes out of town for Thanksgiving" despite the unfortunate truth that really no interventions have helped to this point.  These include injections from myself and other providers.   Patient has previously been treated by Dr. Callie Fielding and Dr. Sheran Luz where she received multiple lumbar epidural steroid injections without significant relief. She did have NCV with EMG of left lower extremity by Dr. Callie Fielding, this study was normal. We have also performed multiple epidural steroid injections with minimal to no relief. Formal physical therapy sessions, most recent at [facility] which she reports made her pain worse and also multiple trials of medications, many of which she was unable to tolerate. Patient had left L4-L5  microdiskectomy in 2021 by Dr. Marikay Alar, patient reports she feels like this surgery did not help to alleviate pain. Dr. Marikay Alar did recently place an order for lumbar MRI, however patient states she does not want to complete this study in a small machine and is waiting for a call back to schedule an open lumbar MRI. Patient currently using cane to assist with ambulation and falls. Patient denies focal weakness, numbness and tingling. Patient denies recent trauma or falls.   Review of Systems  Musculoskeletal:  Positive for back pain.  Neurological:  Negative for tingling, sensory change, focal weakness and weakness.  All other systems reviewed and are negative. Otherwise per HPI.  Assessment & Plan: Visit Diagnoses:    ICD-10-CM   1. Lumbar radiculopathy  M54.16 Ambulatory referral to Physical Medicine Rehab    2. Radiculopathy due to lumbar intervertebral disc disorder  M51.16     3. Post laminectomy syndrome  M96.1     4. Chronic pain syndrome  G89.4        Plan: Findings:  Chronic, worsening and severe bilateral lower back pain radiating down left leg. Patient continues to have excruciating pain despite good conservative therapies such as formal physical therapy, use of ice/heat, TENS unit and medications. Patient's clinical presentation and exam are consistent with classic L5 nerve pattern. We feel the next step is to perform diagnostic and hopefully therapeutic left L5-S1 interlaminar epidural steroid injection. Myself and Dr. Alvester Morin spent more than 30 minutes face-to-face discussing treatment plan with patient. We did discuss plans moving forward if she does  not get good relief with lumbar epidural injection such as follow-up with surgeon for possible removal of small residual left subarticular disc extrusion, spinal cord stimulator placement, and re-grouping with physical therapy. Patient's clinical course is difficult as she has had minimal to no relief with lumbar epidural  injections, minimal relief with surgery, unable to tolerate physical therapy and unable to tolerate multiple medications. We are hopeful that left L5-S1 interlaminar epidural injection will provide her with some relief of pain. Patient is not currently on anticoagulant therapy and we will attempt to get her in before her Thanksgiving trip. No red flag symptoms noted upon exam today.    Meds & Orders: No orders of the defined types were placed in this encounter.   Orders Placed This Encounter  Procedures   Ambulatory referral to Physical Medicine Rehab    Follow-up: Return in about 1 week (around 07/18/2021) for Left L5-S1 interlaminar epidural steroid injection.   Procedures: No procedures performed      Clinical History: CLINICAL DATA:  Lumbar radiculopathy. Additional history provided by scanning technologist: Patient reports low back pain with left leg pain/weakness/numbness since July 2021. History of lumbar surgery.   EXAM: MRI LUMBAR SPINE WITHOUT AND WITH CONTRAST   CONTRAST:  34mL MULTIHANCE GADOBENATE DIMEGLUMINE 529 MG/ML IV SOLN     FINDINGS:   Alignment: Trace retrolisthesis at L1-L2, L2-L3 and L3-L4, unchanged.   Vertebrae: Vertebral body height is maintained. Trace degenerative endplate edema and enhancement at L4-L5. Additionally, there is edema and enhancement along the left greater than right L4-L5 facet joints, likely degenerative. Multilevel degenerative endplate irregularity with small Schmorl nodes. Moderate fatty degenerative endplate marrow signal at L5-S1.   Conus medullaris and cauda equina: Conus extends to the L1-L2 level. No signal abnormality within the visualized distal spinal cord.   Paraspinal and other soft tissues: Multiple bilateral cystic renal lesions more fully characterized on the prior CT abdomen/pelvis of 10/06/2020. Redemonstrated right nephrolithiasis. Postsurgical changes to the lower dorsal paraspinal soft tissues.   Disc  levels:   L3-L4: Trace retrolisthesis. Disc bulge. Superimposed broad-based right subarticular to right extraforaminal disc extrusion with slight caudal migration. Mild facet arthrosis. As before, the disc protrusion contributes to moderate right subarticular stenosis, encroaching upon the descending right L4 nerve root. Central canal patent. The disc protrusion also contributes to moderate right neural foraminal narrowing, unchanged. Mild left neural foraminal narrowing, unchanged.   L4-L5: Interval left laminectomy and discectomy. Disc bulge with endplate spurring. Superimposed small residual/recurrent left subarticular disc extrusion with slight caudal migration. Moderate facet arthrosis. Although improved, there is persistent left subarticular narrowing with the disc extrusion contacting and posteriorly displacing the descending left L5 nerve root (series 13, image 30). Minimal relative right subarticular and central canal narrowing. Moderate bilateral neural foraminal narrowing (progressed on the right).   L5-S1: Disc bulge with endplate spurring. Superimposed central disc protrusion slightly eccentric to the right. Mild-to-moderate facet arthrosis. As before, the disc protrusion contributes to mild bilateral subarticular narrowing without frank nerve root impingement. Central canal patent. Bilateral neural foraminal narrowing (moderate/severe right, moderate left).   IMPRESSION: Comparison is made to the prior lumbar spine MRI of 04/05/2020.   At L4-L5, there has been interval left laminectomy and discectomy. There is a small residual/recurrent left subarticular disc extrusion with slight caudal migration. Although improved, there is persistent left subarticular narrowing with the disc extrusion contacting and posteriorly displacing the descending left L5 nerve root. Multifactorial moderate bilateral neural foraminal narrowing (progressed on the right).  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   At L3-L4, a caudally migrated broad-based right subarticular to right extraforaminal disc extrusion contributes to moderate right subarticular stenosis, encroaching upon the descending right L4 nerve root. It also contributes to moderate right neural foraminal narrowing.   At L5-S1, there is advanced disc degeneration. A small central disc protrusion contributes to mild bilateral subarticular narrowing without appreciable nerve root impingement. Multifactorial bilateral neural foraminal narrowing (moderate/severe right, moderate left).   Right nephrolithiasis.   Bilateral cystic renal lesions more fully characterized on the CT abdomen/pelvis of 10/06/2020.     Electronically Signed   By: Jackey Loge DO   On: 10/26/2020 08:30   She reports that she quit smoking about 39 years ago. Her smoking use included cigarettes. She has a 25.00 pack-year smoking history. She has never used smokeless tobacco.  Recent Labs    09/21/20 1338 12/20/20 1034 06/21/21 1317  HGBA1C 7.5* 6.9* 7.7*    Objective:  VS:  HT:    WT:   BMI:     BP: (!) 151/60  HR:81bpm  TEMP: ( )  RESP:  Physical Exam HENT:     Head: Normocephalic and atraumatic.     Right Ear: External ear normal.     Left Ear: External ear normal.     Nose: Nose normal.     Mouth/Throat:     Mouth: Mucous membranes are moist.  Eyes:     Extraocular Movements: Extraocular movements intact.  Cardiovascular:     Rate and Rhythm: Normal rate.     Pulses: Normal pulses.  Pulmonary:     Effort: Pulmonary effort is normal.  Abdominal:     General: Abdomen is flat. There is no distension.  Musculoskeletal:        General: Tenderness present.     Cervical back: Normal range of motion.     Right lower leg: No edema.     Left lower leg: No edema.     Comments: Pt rises from seated position to standing without difficulty. Good lumbar range  of motion. Strong distal strength without clonus, no pain upon palpation of greater trochanters. Sensation intact bilaterally. Ambulates with cane, gait unsteady.    Skin:    General: Skin is warm and dry.     Capillary Refill: Capillary refill takes less than 2 seconds.     Findings: No rash.  Neurological:     General: No focal deficit present.     Mental Status: She is alert and oriented to person, place, and time.     Motor: No weakness.     Gait: Gait abnormal.  Psychiatric:        Mood and Affect: Mood normal.    Ortho Exam  Imaging: No results found.  Past Medical/Family/Surgical/Social History: Medications & Allergies reviewed per EMR, new medications updated. 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   Past  Medical History:  Diagnosis Date   Arthritis    DM type 2 (diabetes mellitus, type 2) (HCC)    GERD (gastroesophageal reflux disease)    History of kidney stones    Hyperlipemia    Hypertension    Left leg pain    Wears hearing aid in both ears    Family History  Family history unknown: Yes   Past Surgical History:  Procedure Laterality Date   ABDOMINAL HYSTERECTOMY  1982   partial   APPENDECTOMY  1957   BACK SURGERY  07/12/2020   lower at [facility]   CYSTOSCOPY W/ URETERAL STENT PLACEMENT Right 10/06/2020   Procedure: CYSTOSCOPY WITH RETROGRADE PYELOGRAM/URETERAL STENT  PLACEMENT;  Surgeon: Sebastian Ache, MD;  Location: [facility] OR;  Service: Urology;  Laterality: Right;   CYSTOSCOPY WITH RETROGRADE PYELOGRAM, URETEROSCOPY AND STENT PLACEMENT Right 10/26/2020   Procedure: CYSTOSCOPY WITH RETROGRADE PYELOGRAM, URETEROSCOPY AND STENT REPLACEMENT;  Surgeon: Sebastian Ache, MD;  Location: [facility];  Service: Urology;  Laterality: Right;  90 MINS   HOLMIUM LASER APPLICATION Right 10/26/2020   Procedure: HOLMIUM LASER APPLICATION;  Surgeon: Sebastian Ache, MD;  Location[facility];  Service: Urology;  Laterality: Right;   JOINT REPLACEMENT  2016   both knees partial replacement   Social History   Occupational History   Not on file  Tobacco Use   Smoking status: Former    Packs/day: 1.00    Years: 25.00    Pack years: 25.00    Types: Cigarettes    Quit date: 09/08/1981    Years since quitting: 39.8   Smokeless tobacco: Never  Vaping Use   Vaping Use: Never used  Substance and Sexual Activity   Alcohol use: Never   Drug use: Never   Sexual activity: Not on file

## 2021-07-11 NOTE — Progress Notes (Signed)
Pt state lower back pain that travels down her left leg. Pt state walking, standing and laying down makes the pain worse. Pt state she takes over the counter pain meds to help ease her pain.  Numeric Pain Rating Scale and Functional Assessment Average Pain 10 Pain Right Now 10 My pain is constant, sharp, dull, and stabbing Pain is worse with: walking, sitting, standing, some activites, and Laying down Pain improves with: medication   In the last MONTH (on 0-10 scale) has pain interfered with the following?  1. General activity like being  able to carry out your everyday physical activities such as walking, climbing stairs, carrying groceries, or moving a chair?  Rating(7)  2. Relation with others like being able to carry out your usual social activities and roles such as  activities at home, at work and in your community. Rating(8)  3. Enjoyment of life such that you have  been bothered by emotional problems such as feeling anxious, depressed or irritable?  Rating(9)

## 2021-07-17 ENCOUNTER — Ambulatory Visit
Admission: RE | Admit: 2021-07-17 | Discharge: 2021-07-17 | Disposition: A | Discharge status: Home / Self Care | Payer: Medicare Other | Source: Ambulatory Visit | Attending: Neurological Surgery | Admitting: Neurological Surgery

## 2021-07-17 DIAGNOSIS — M5416 Radiculopathy, lumbar region: Secondary | ICD-10-CM

## 2021-07-17 IMAGING — MR MR LUMBAR SPINE WO/W CM
4 of 7 series · 24 of 48 positions shown · IV contrast (15ml multihance)
Comparison: MRI of lumbar spine [DATE].

CLINICAL DATA: Lumbar radiculopathy.

EXAM:
MRI LUMBAR SPINE WITHOUT AND WITH CONTRAST
TECHNIQUE: Multiplanar and multiecho pulse sequences of the lumbar spine were
obtained without and with intravenous contrast.
CONTRAST:  15mL MULTIHANCE GADOBENATE DIMEGLUMINE 529 MG/ML IV SOLN

[Series 3: T1 · sagittal · 4.0mm · 1.09mm/px · 5 of 16 slices shown (1 of 2)]
[im 1/16]
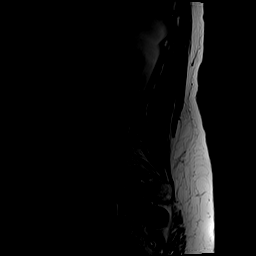
[im 4/16]
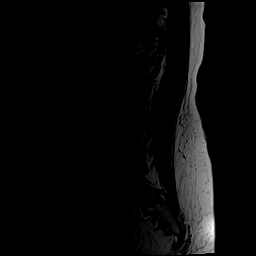
[im 8/16]
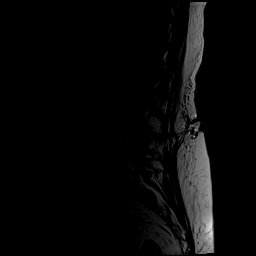
[im 12/16]
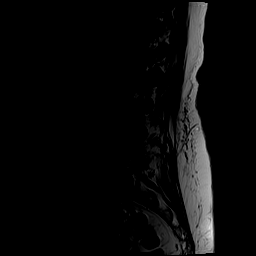
[im 16/16]
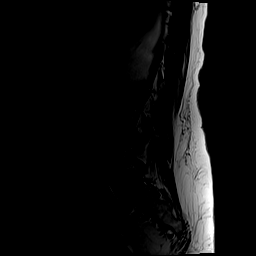

[Series 5: T2 · axial · 4.0mm · 0.39mm/px · z∈[-93,+117]mm · 8 of 40 slices shown (1 of 2)]
[im 1/40]
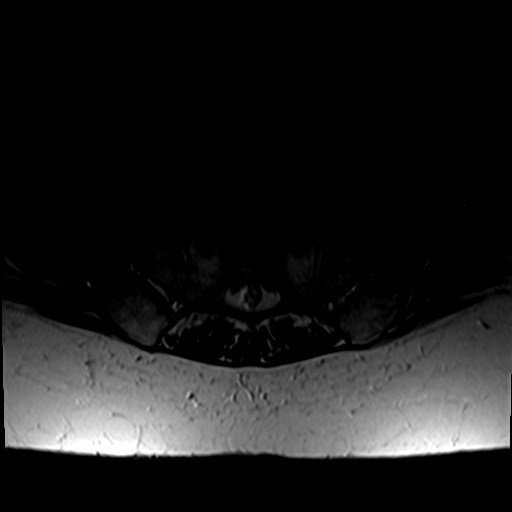
[im 5/40]
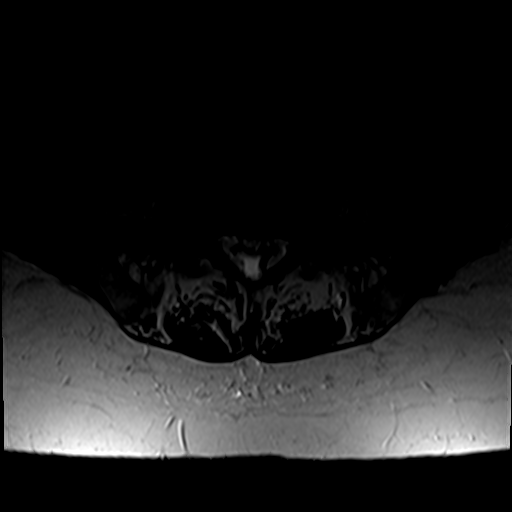
[im 14/40]
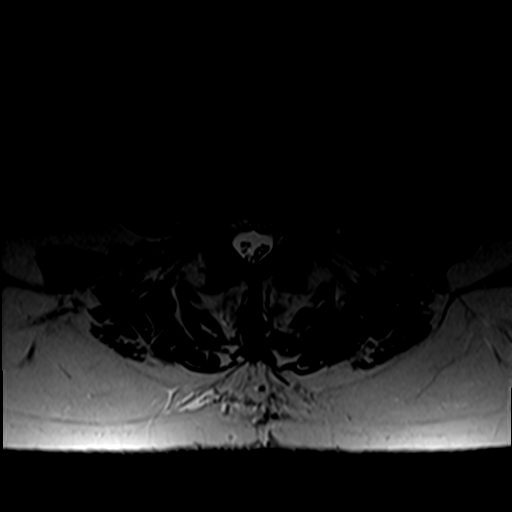
[im 18/40]
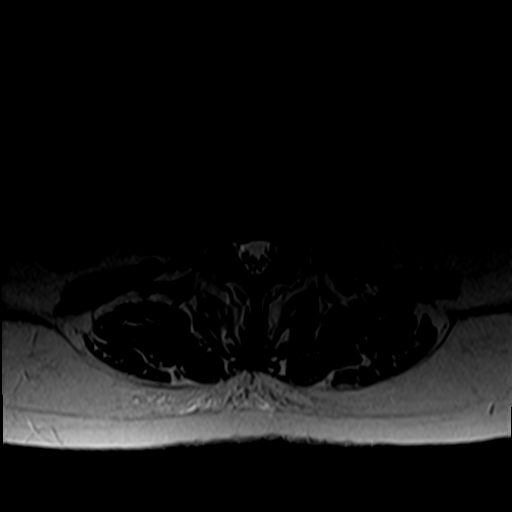
[im 22/40]
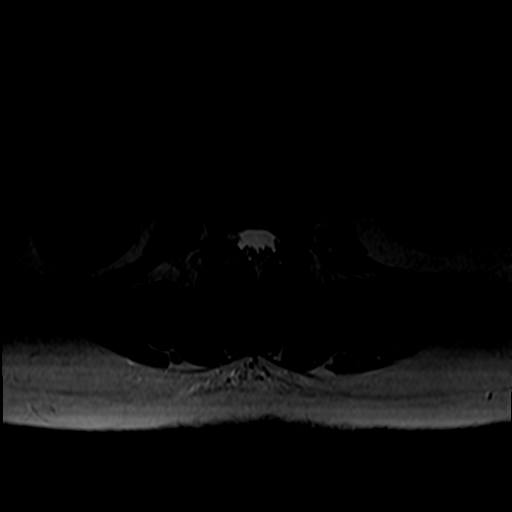
[im 27/40]
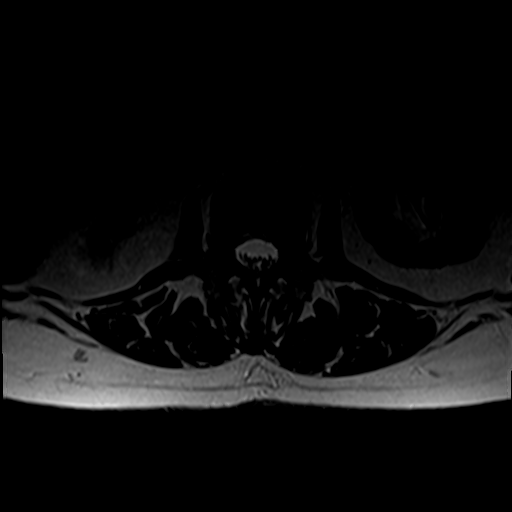
[im 35/40]
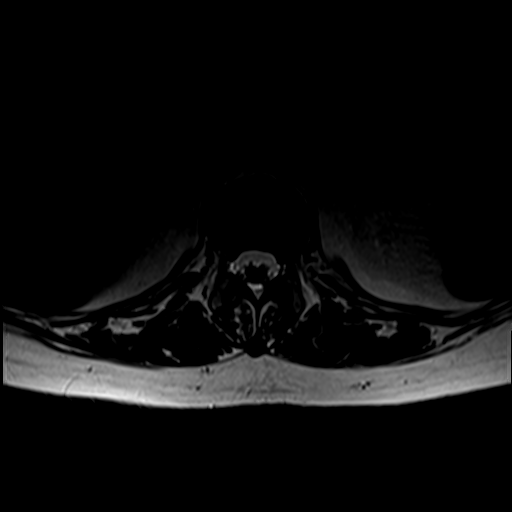
[im 40/40]
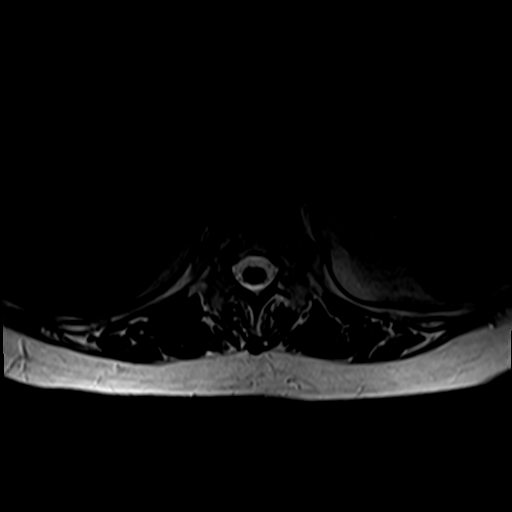

[Series 6: T1 · axial · 4.0mm · 0.39mm/px · z∈[-93,+93]mm · 7 of 40 slices shown (2 of 2)]
[im 1/40]
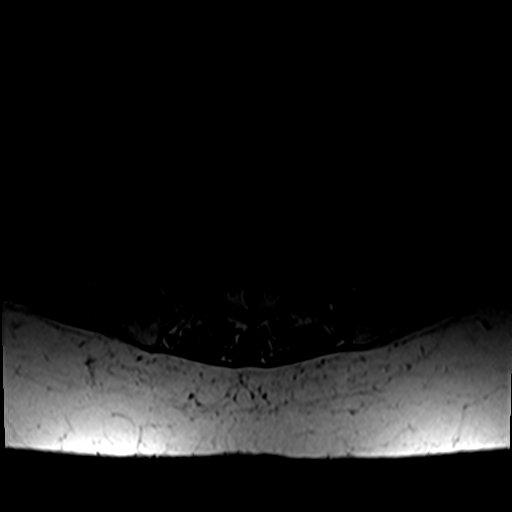
[im 5/40]
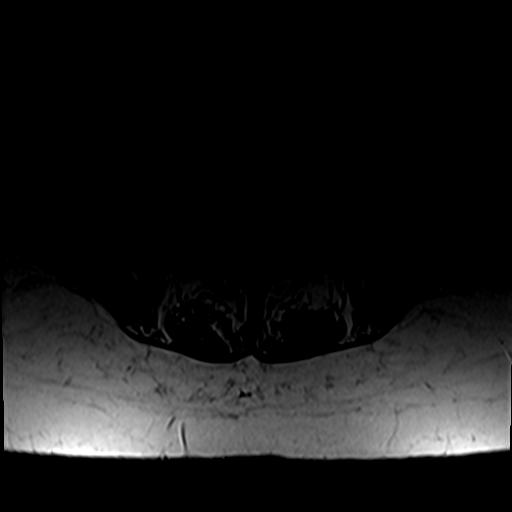
[im 14/40]
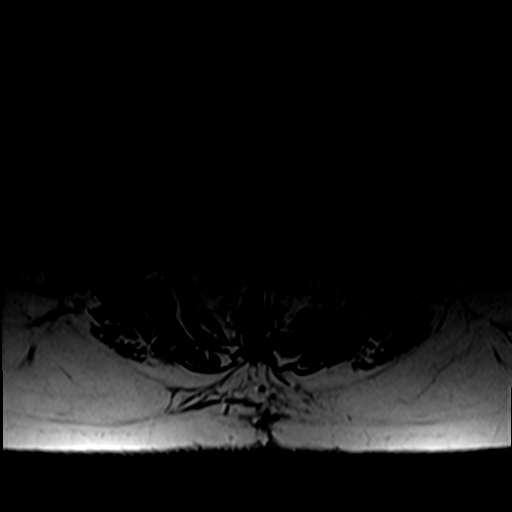
[im 18/40]
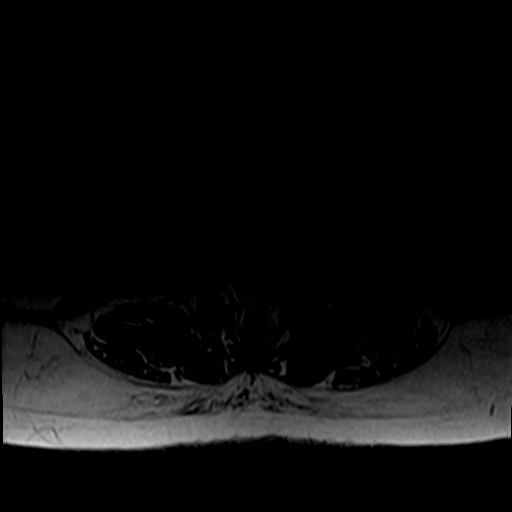
[im 22/40]
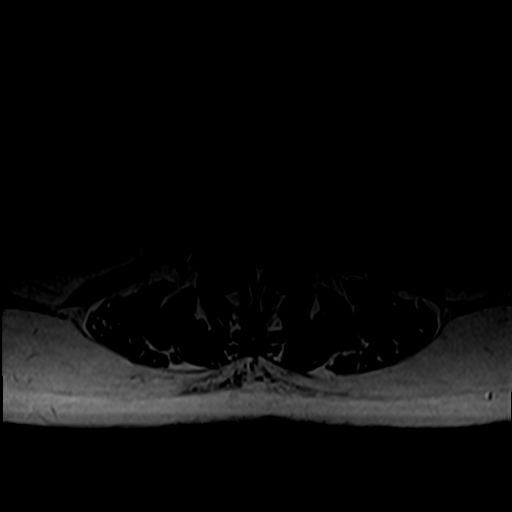
[im 27/40]
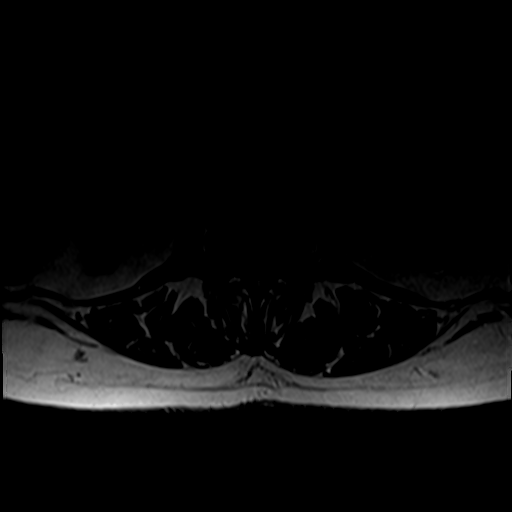
[im 35/40]
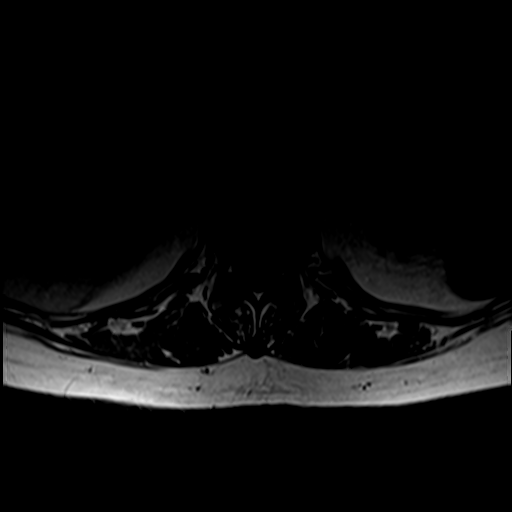

[Series 7: T2 · sagittal · 4.0mm · 1.09mm/px · 4 of 16 slices shown (2 of 2)]
[im 1/16]
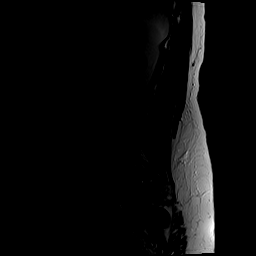
[im 6/16]
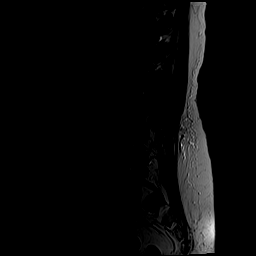
[im 11/16]
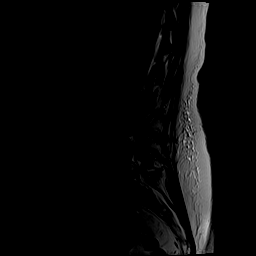
[im 16/16]
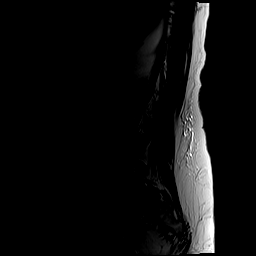

[24 of 48 positions shown; findings below may reference images not displayed]

FINDINGS: Segmentation:  Standard.

Alignment:  Trace retrolisthesis at L5-S1.

Vertebrae: No fracture, evidence of discitis, or bone lesion.
Endplate degenerative changes at L3-4, L4-5 and L5-S1.

Conus medullaris and cauda equina: Conus extends to the L1-2 level.
Conus and cauda equina appear normal.

Paraspinal and other soft tissues: Stable appearance of
heterogeneous 2 cm right renal lesion.

Disc levels:

T12-L1: No spinal canal or neural foraminal stenosis.

L1-2 shallow disc bulge and mild facet degenerative changes without
significant spinal canal or neural foraminal stenosis.

L2-3: Mild facet degenerative changes. No significant spinal canal
or neural foraminal stenosis.

L3-4: Disc bulge with superimposed right subarticular/foraminal disc
protrusion, mild to moderate facet degenerative change and
ligamentum flavum redundancy. Findings result in moderate narrowing
of the right subarticular zone, moderate right and mild left neural
foraminal narrowing. No significant spinal canal stenosis. No
significant change from prior.

L4-5: Postsurgical changes from left laminectomy and micro
discectomy. Loss of disc height, progressed from prior MRI, with
recurrent inferiorly migrating left central to foraminal disc
extrusion, mild facet degenerative changes and ligamentum flavum
redundancy. Findings result in mild spinal canal stenosis mild right
and severe left subarticular zone stenosis with posterior
displacement of the traversing left L5 nerve root and moderate to
severe bilateral neural foraminal narrowing.

L5-S1: Loss of disc high, disc bulge with superimposed tiny right
central disc protrusion and mild facet degenerative changes
resulting in mild narrowing of the bilateral subarticular zones,
moderate to severe right and mild left neural foraminal narrowing.
IMPRESSION: 1. Recurrent left central to foraminal disc extrusion at L4-5 with
severe narrowing of the left subarticular zone and posterior
displacement of the traversing left L5 nerve root. Moderate to
severe bilateral neural foraminal narrowing at this level.
2. Moderate to severe right and mild left neural foraminal narrowing
at L5-S1.
3. Moderate right and mild left neural foraminal narrowing at L3-4.

## 2021-07-17 MED ORDER — GADOBENATE DIMEGLUMINE 529 MG/ML IV SOLN
15.0000 mL | Freq: Once | INTRAVENOUS | Status: AC | PRN
  Administered 2021-07-17: 15 mL via INTRAVENOUS

## 2021-07-18 ENCOUNTER — Ambulatory Visit: Payer: Self-pay

## 2021-07-18 ENCOUNTER — Other Ambulatory Visit: Payer: Self-pay

## 2021-07-18 ENCOUNTER — Ambulatory Visit (INDEPENDENT_AMBULATORY_CARE_PROVIDER_SITE_OTHER): Payer: Medicare Other | Admitting: Physical Medicine and Rehabilitation

## 2021-07-18 ENCOUNTER — Encounter: Payer: Self-pay | Admitting: Physical Medicine and Rehabilitation

## 2021-07-18 VITALS — BP 158/84 | HR 85

## 2021-07-18 DIAGNOSIS — M5416 Radiculopathy, lumbar region: Secondary | ICD-10-CM

## 2021-07-18 DIAGNOSIS — G8929 Other chronic pain: Secondary | ICD-10-CM

## 2021-07-18 DIAGNOSIS — M25511 Pain in right shoulder: Secondary | ICD-10-CM

## 2021-07-18 DIAGNOSIS — Z96653 Presence of artificial knee joint, bilateral: Secondary | ICD-10-CM

## 2021-07-18 MED ORDER — BETAMETHASONE SOD PHOS & ACET 6 (3-3) MG/ML IJ SUSP
12.0000 mg | Freq: Once | INTRAMUSCULAR | Status: AC
  Administered 2021-07-18: 12 mg

## 2021-07-18 NOTE — Progress Notes (Signed)
Pt state lower back pain that travels down her left leg. Pt state walking, standing and laying down makes the pain worse. Pt state she takes over the counter pain meds to help ease her pain.  Numeric Pain Rating Scale and Functional Assessment Average Pain 8   In the last MONTH (on 0-10 scale) has pain interfered with the following?  1. General activity like being  able to carry out your everyday physical activities such as walking, climbing stairs, carrying groceries, or moving a chair?  Rating(10)   +Driver, -BT, -Dye Allergies.  

## 2021-07-18 NOTE — Patient Instructions (Signed)

## 2021-07-23 NOTE — Progress Notes (Signed)
Ann Thompson - 80 y.o. female MRN [id number]  Date of birth: 1941-09-02  Office Visit Note: Visit Date: 07/18/2021 PCP: [name], NP Referred by: [name], NP  Subjective: Chief Complaint  Patient presents with   Lower Back - Pain   Left Leg - Pain   HPI:  Ann Thompson is a 80 y.o. female who comes in todayAt the request of Dr. Marikay Alar for a left L5 nerve root block.  Just so the record is straight along with the notes in our office we have completed 2 prior left L5 transforaminal epidural injections which are basically nerve root blocks and she has gotten really not much relief from those at all.  My notes are pretty well-documented that this seems like an L5 radicular pain.  She now has new MRI just completed by Dr. Yetta Barre and he suggested an L5 nerve root block.  She brought with her today a little note from him that talks about that and this is basically MRI shows some fibrosis as well as questionable recurrent small disc area.  We will once again complete an L5 nerve root block from a transforaminal approach today.  She has had all other manner of conservative and nonconservative care with no relief.  ROS Otherwise per HPI.  Assessment & Plan: Visit Diagnoses:    ICD-10-CM   1. Lumbar radiculopathy  M54.16 XR C-ARM NO REPORT    Epidural Steroid injection    betamethasone acetate-betamethasone sodium phosphate (CELESTONE) injection 12 mg    2. Chronic right shoulder pain  M25.511 Ambulatory referral to Orthopedic Surgery   G89.29     3. Chronic pain of left knee  M25.562 Ambulatory referral to Orthopedic Surgery   G89.29     4. S/P bilateral unicompartmental knee replacement  Z96.653 Ambulatory referral to Orthopedic Surgery      Plan: No additional findings.   Meds & Orders:  Meds ordered this encounter  Medications   betamethasone acetate-betamethasone sodium phosphate (CELESTONE) injection 12 mg    Orders Placed This Encounter  Procedures   XR C-ARM  NO REPORT   Ambulatory referral to Orthopedic Surgery   Epidural Steroid injection    Follow-up: Return for Marikay Alar, MD as scheduled.   Procedures: No procedures performed  Selective Nerve Root Block/Transforaminal Epidural Steroid Injection - Sub-Pedicular Approach with Fluoroscopic Guidance  Patient: Ann Thompson      Date of Birth: 1940-12-05 MRN: [id number] PCP: [name], NP      Visit Date: 07/18/2021   Universal Protocol:    Date/Time: 07/18/2021  Consent Given By: the patient  Position: PRONE  Additional Comments: Vital signs were monitored before and after the procedure. Patient was prepped and draped in the usual sterile fashion. The correct patient, procedure, and site was verified.   Injection Procedure Details:   Procedure diagnoses: Lumbar radiculopathy [M54.16]    Meds Administered:  Meds ordered this encounter  Medications   betamethasone acetate-betamethasone sodium phosphate (CELESTONE) injection 12 mg    Laterality: Left  Location/Site: L5  Needle:5.0 in., 22 ga.  Short bevel or Quincke spinal needle  Needle Placement: Transforaminal  Findings:    -Comments: Excellent flow of contrast along the nerve, nerve root and into the epidural space.  Procedure Details: After squaring off the end-plates to get a true AP view, the C-arm was positioned so that an oblique view of the foramen as noted above was visualized. The target area is just inferior to the "nose of the scotty dog"  or sub pedicular. The soft tissues overlying this structure were infiltrated with 2-3 ml. of 1% Lidocaine without Epinephrine.  The spinal needle was inserted toward the target using a "trajectory" view along the fluoroscope beam.  Under AP and lateral visualization, the needle was advanced so it did not puncture dura and was located close the 6 O'Clock position of the pedical in AP tracterory. Biplanar projections were used to confirm position. Aspiration was  confirmed to be negative for CSF and/or blood. A 1-2 ml. volume of Isovue-250 was injected and flow of contrast was noted at each level. Radiographs were obtained for documentation purposes.   After attaining the desired flow of contrast documented above, a 0.5 to 1.0 ml test dose of 0.25% Marcaine was injected into each respective transforaminal space.  The patient was observed for 90 seconds post injection.  After no sensory deficits were reported, and normal lower extremity motor function was noted,   the above injectate was administered so that equal amounts of the injectate were placed at each foramen (level) into the transforaminal epidural space.   Additional Comments:  No complications occurred Dressing: 2 x 2 sterile gauze and Band-Aid    Post-procedure details: Patient was observed during the procedure. Post-procedure instructions were reviewed.  Patient left the clinic in stable condition.    Clinical History: CLINICAL DATA:  Lumbar radiculopathy. Additional history provided by scanning technologist: Patient reports low back pain with left leg pain/weakness/numbness since July 2021. History of lumbar surgery.   EXAM: MRI LUMBAR SPINE WITHOUT AND WITH CONTRAST   CONTRAST:  17mL MULTIHANCE GADOBENATE DIMEGLUMINE 529 MG/ML IV SOLN     FINDINGS:   Alignment: Trace retrolisthesis at L1-L2, L2-L3 and L3-L4, unchanged.   Vertebrae: Vertebral body height is maintained. Trace degenerative endplate edema and enhancement at L4-L5. Additionally, there is edema and enhancement along the left greater than right L4-L5 facet joints, likely degenerative. Multilevel degenerative endplate irregularity with small Schmorl nodes. Moderate fatty degenerative endplate marrow signal at L5-S1.   Conus medullaris and cauda equina: Conus extends to the L1-L2 level. No signal abnormality within the visualized distal spinal cord.   Paraspinal and other soft tissues: Multiple bilateral cystic  renal lesions more fully characterized on the prior CT abdomen/pelvis of 10/06/2020. Redemonstrated right nephrolithiasis. Postsurgical changes to the lower dorsal paraspinal soft tissues.   Disc levels:   L3-L4: Trace retrolisthesis. Disc bulge. Superimposed broad-based right subarticular to right extraforaminal disc extrusion with slight caudal migration. Mild facet arthrosis. As before, the disc protrusion contributes to moderate right subarticular stenosis, encroaching upon the descending right L4 nerve root. Central canal patent. The disc protrusion also contributes to moderate right neural foraminal narrowing, unchanged. Mild left neural foraminal narrowing, unchanged.   L4-L5: Interval left laminectomy and discectomy. Disc bulge with endplate spurring. Superimposed small residual/recurrent left subarticular disc extrusion with slight caudal migration. Moderate facet arthrosis. Although improved, there is persistent left subarticular narrowing with the disc extrusion contacting and posteriorly displacing the descending left L5 nerve root (series 13, image 30). Minimal relative right subarticular and central canal narrowing. Moderate bilateral neural foraminal narrowing (progressed on the right).   L5-S1: Disc bulge with endplate spurring. Superimposed central disc protrusion slightly eccentric to the right. Mild-to-moderate facet arthrosis. As before, the disc protrusion contributes to mild bilateral subarticular narrowing without frank nerve root impingement. Central canal patent. Bilateral neural foraminal narrowing (moderate/severe right, moderate left).   IMPRESSION: Comparison is made to the prior lumbar spine MRI of 04/05/2020.  At L4-L5, there has been interval left laminectomy and discectomy. There is a small residual/recurrent left subarticular disc extrusion with slight caudal migration. Although improved, there is persistent left subarticular narrowing with  the disc extrusion contacting and posteriorly displacing the descending left L5 nerve root. Multifactorial moderate bilateral neural foraminal narrowing (progressed on the right).   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   At L3-L4, a caudally migrated broad-based right subarticular to right extraforaminal disc extrusion contributes to moderate right subarticular stenosis, encroaching upon the descending right L4 nerve root. It also contributes to moderate right neural foraminal narrowing.   At L5-S1, there is advanced disc degeneration. A small central disc protrusion contributes to mild bilateral subarticular narrowing without appreciable nerve root impingement. Multifactorial bilateral neural foraminal narrowing (moderate/severe right, moderate left).   Right nephrolithiasis.   Bilateral cystic renal lesions more fully characterized on the CT abdomen/pelvis of 10/06/2020.     Electronically Signed   By: Jackey Loge DO   On: 10/26/2020 08:30     Objective:  VS:  HT:    WT:   BMI:     BP:(!) 158/84  HR:85bpm  TEMP: ( )  RESP:  Physical Exam Vitals and nursing note reviewed.  Constitutional:      General: She is not in acute distress.    Appearance: Normal appearance. She is obese. She is not ill-appearing.  HENT:     Head: Normocephalic and atraumatic.     Right Ear: External ear normal.     Left Ear: External ear normal.  Eyes:     Extraocular Movements: Extraocular movements intact.  Cardiovascular:     Rate and Rhythm: Normal rate.     Pulses: Normal pulses.  Pulmonary:     Effort: Pulmonary effort is normal. No respiratory distress.  Abdominal:     General: There is no distension.     Palpations: Abdomen is soft.  Musculoskeletal:        General: Tenderness present.     Cervical back: Neck supple.     Right lower leg: No edema.     Left lower leg: No edema.     Comments: Patient has good distal  strength with no pain over the greater trochanters.  No clonus or focal weakness.  Positive dysesthesia in L5 dermatome on the left.  Positive slump test on the left.  Skin:    Findings: No erythema, lesion or rash.  Neurological:     General: No focal deficit present.     Mental Status: She is alert and oriented to person, place, and time.     Sensory: No sensory deficit.     Motor: No weakness or abnormal muscle tone.     Coordination: Coordination normal.  Psychiatric:        Mood and Affect: Mood normal.        Behavior: Behavior normal.     Imaging: No results found.

## 2021-07-23 NOTE — Procedures (Signed)
Selective Nerve Root Block/Transforaminal Epidural Steroid Injection - Sub-Pedicular Approach with Fluoroscopic Guidance  Patient: Ann Thompson      Date of Birth: February 21, 1941 MRN: [id number] PCP: [name], NP      Visit Date: 07/18/2021   Universal Protocol:    Date/Time: 07/18/2021  Consent Given By: the patient  Position: PRONE  Additional Comments: Vital signs were monitored before and after the procedure. Patient was prepped and draped in the usual sterile fashion. The correct patient, procedure, and site was verified.   Injection Procedure Details:   Procedure diagnoses: Lumbar radiculopathy [M54.16]    Meds Administered:  Meds ordered this encounter  Medications   betamethasone acetate-betamethasone sodium phosphate (CELESTONE) injection 12 mg    Laterality: Left  Location/Site: L5  Needle:5.0 in., 22 ga.  Short bevel or Quincke spinal needle  Needle Placement: Transforaminal  Findings:    -Comments: Excellent flow of contrast along the nerve, nerve root and into the epidural space.  Procedure Details: After squaring off the end-plates to get a true AP view, the C-arm was positioned so that an oblique view of the foramen as noted above was visualized. The target area is just inferior to the "nose of the scotty dog" or sub pedicular. The soft tissues overlying this structure were infiltrated with 2-3 ml. of 1% Lidocaine without Epinephrine.  The spinal needle was inserted toward the target using a "trajectory" view along the fluoroscope beam.  Under AP and lateral visualization, the needle was advanced so it did not puncture dura and was located close the 6 O'Clock position of the pedical in AP tracterory. Biplanar projections were used to confirm position. Aspiration was confirmed to be negative for CSF and/or blood. A 1-2 ml. volume of Isovue-250 was injected and flow of contrast was noted at each level. Radiographs were obtained for documentation purposes.    After attaining the desired flow of contrast documented above, a 0.5 to 1.0 ml test dose of 0.25% Marcaine was injected into each respective transforaminal space.  The patient was observed for 90 seconds post injection.  After no sensory deficits were reported, and normal lower extremity motor function was noted,   the above injectate was administered so that equal amounts of the injectate were placed at each foramen (level) into the transforaminal epidural space.   Additional Comments:  No complications occurred Dressing: 2 x 2 sterile gauze and Band-Aid    Post-procedure details: Patient was observed during the procedure. Post-procedure instructions were reviewed.  Patient left the clinic in stable condition.

## 2021-07-27 ENCOUNTER — Other Ambulatory Visit: Payer: Medicare Other

## 2021-08-05 ENCOUNTER — Other Ambulatory Visit: Payer: Medicare Other

## 2021-08-07 ENCOUNTER — Ambulatory Visit: Payer: Self-pay

## 2021-08-07 ENCOUNTER — Ambulatory Visit (INDEPENDENT_AMBULATORY_CARE_PROVIDER_SITE_OTHER): Payer: Medicare Other | Admitting: Orthopaedic Surgery

## 2021-08-07 ENCOUNTER — Ambulatory Visit (INDEPENDENT_AMBULATORY_CARE_PROVIDER_SITE_OTHER): Payer: Medicare Other

## 2021-08-07 DIAGNOSIS — M25511 Pain in right shoulder: Secondary | ICD-10-CM | POA: Diagnosis not present

## 2021-08-07 DIAGNOSIS — M25562 Pain in left knee: Secondary | ICD-10-CM

## 2021-08-07 DIAGNOSIS — G8929 Other chronic pain: Secondary | ICD-10-CM

## 2021-08-07 DIAGNOSIS — M25561 Pain in right knee: Secondary | ICD-10-CM | POA: Diagnosis not present

## 2021-08-07 DIAGNOSIS — Z96659 Presence of unspecified artificial knee joint: Secondary | ICD-10-CM

## 2021-08-07 NOTE — Progress Notes (Signed)
Office Visit Note   Patient: Ann Thompson           Date of Birth: 03/29/1941           MRN: [id number] Visit Date: 08/07/2021              Requested by: Tyrell Antonio, MD [address] [place],  [place] [address] PCP: [name], NP   Assessment & Plan: Visit Diagnoses:  1. Chronic right shoulder pain   2. Chronic pain of left knee   3. Chronic pain of right knee   4. History of partial knee replacement     Plan: We now have a good baseline of what is going on with her knees and how her partial knee arthroplasties look.  Right now I would not recommend any other treatment for the knee since they are not really bothering her and the replacements are stable in terms of the partial knee arthroplasties.  I did speak to her about a subacromial steroid injection in her right shoulder but she says is not bothering her enough right now and deferred this.  I do not feel like she needs any therapy for her right shoulder since her mobility is good and strength is good with that shoulder.  All questions and concerns were answered and addressed.  Follow-up is as needed.  Follow-Up Instructions: Return if symptoms worsen or fail to improve.   Orders:  Orders Placed This Encounter  Procedures   XR Shoulder Right   XR Knee 1-2 Views Left   XR Knee 1-2 Views Right   No orders of the defined types were placed in this encounter.     Procedures: No procedures performed   Clinical Data: No additional findings.   Subjective: Chief Complaint  Patient presents with   Right Shoulder - Pain   Left Knee - Pain   Right Knee - Pain  The patient comes in today is somewhat of a referral from Dr. Alvester Morin due to the fact that she does have chronic pain in her legs more so on the left than the right and she has a history of bilateral partial knee arthroplasties that were done in [place] in 2016.  She really denies any knee pain and points to more of the anterior shin on the left side from the  below the knee to the ankle as a source of her pain.  She denies any swelling.  She is also been dealing with right shoulder pain that has been chronic with reaching overhead and behind her.  She had a fall about 2 years ago and was told in [place] that she may have a rotator cuff injury and they would not recommend any type of surgery for her.  She really wants to get a good baseline of how her partial knee replacements are doing and first evaluate her right shoulder.  Her shoulder bothers her more with reaching overhead and behind and with actives daily living.  She denies any significant weakness.  She ambulates without an assistive device.  HPI  Review of Systems There is currently listed no headache, chest pain, shortness of breath, fever, chills, nausea, vomiting  Objective: Vital Signs: There were no vitals taken for this visit.  Physical Exam She is alert and oriented x3 and in no acute distress Ortho Exam Examination of both right and left knee showed no effusion.  Both knees have well-healed medial surgical incisions.  There is only minimal patellofemoral crepitation.  Both knees are ligamentously  stable with full range of motion.  Both knees are cool.  Examination of her right shoulder shows just some slight decreased motion and slight weakness but mainly pain in the subacromial outlet and pain with positive Neer and Hawkins signs the shoulder is well located.  She has actually pretty good shoulder function on the right side. Specialty Comments:  No specialty comments available.  Imaging: XR Knee 1-2 Views Left  Result Date: 08/07/2021 2 views of the left knee show well-seated partial knee arthroplasty of the medial compartment.  There is no acute findings otherwise.  There is no evidence of loosening.  XR Knee 1-2 Views Right  Result Date: 08/07/2021 2 views of the right knee show partial knee arthroplasty with no complicating features.  XR Shoulder Right  Result Date:  08/07/2021 3 views of the right shoulder show some arthritic changes that are minimal or mild.  The shoulder is well located.  The humeral head is not high riding.    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   Past Medical History:  Diagnosis Date   Arthritis    DM type 2 (diabetes mellitus, type 2) (HCC)    GERD (gastroesophageal reflux disease)    History of kidney stones    Hyperlipemia    Hypertension    Left leg pain    Wears hearing aid in both ears     Family History  Family history unknown: Yes    Past Surgical History:  Procedure Laterality Date   ABDOMINAL HYSTERECTOMY  1982   partial   APPENDECTOMY  1957   BACK SURGERY  07/12/2020   lower at [facility]   CYSTOSCOPY W/ URETERAL STENT PLACEMENT Right 10/06/2020   Procedure: CYSTOSCOPY WITH RETROGRADE PYELOGRAM/URETERAL STENT PLACEMENT;  Surgeon: Sebastian Ache, MD;  Location: [facility] OR;  Service: Urology;  Laterality: Right;   CYSTOSCOPY WITH RETROGRADE PYELOGRAM, URETEROSCOPY AND STENT PLACEMENT Right 10/26/2020   Procedure: CYSTOSCOPY WITH RETROGRADE PYELOGRAM, URETEROSCOPY AND STENT REPLACEMENT;  Surgeon: Sebastian Ache, MD;  Location: [facility];  Service: Urology;  Laterality: Right;  90 MINS   HOLMIUM LASER APPLICATION Right 10/26/2020   Procedure: HOLMIUM LASER APPLICATION;  Surgeon: Sebastian Ache, MD;   [facility];  Service: Urology;  Laterality: Right;   JOINT REPLACEMENT  2016   both knees partial replacement   Social History   Occupational History   Not on file  Tobacco Use   Smoking status: Former    Packs/day: 1.00    Years: 25.00    Pack years: 25.00    Types: Cigarettes    Quit date: 09/08/1981    Years since quitting: 39.9   Smokeless tobacco: Never  Vaping Use   Vaping Use: Never used  Substance and Sexual Activity   Alcohol use: Never   Drug use: Never   Sexual activity: Not on file

## 2021-08-12 ENCOUNTER — Telehealth: Payer: Self-pay

## 2021-08-12 ENCOUNTER — Other Ambulatory Visit: Payer: Self-pay

## 2021-08-12 DIAGNOSIS — E118 Type 2 diabetes mellitus with unspecified complications: Secondary | ICD-10-CM

## 2021-08-12 NOTE — Progress Notes (Unsigned)
Husband of patient called stating patient is out of Rx due to the dose change and a refill is needed patient is completely out.

## 2021-08-12 NOTE — Telephone Encounter (Signed)
Husband of patient called requesting Rx refill patient is completely out due to dose change  JANUVIA 25 MG tablet

## 2021-08-13 ENCOUNTER — Other Ambulatory Visit: Payer: Self-pay | Admitting: Adult Health

## 2021-08-13 DIAGNOSIS — E118 Type 2 diabetes mellitus with unspecified complications: Secondary | ICD-10-CM

## 2021-08-13 MED ORDER — SITAGLIPTIN PHOSPHATE 50 MG PO TABS: 50.0000 mg | ORAL_TABLET | Freq: Every day | ORAL | 0 refills | Status: DC

## 2021-08-13 NOTE — Telephone Encounter (Signed)
Rx sent to pharmacy   

## 2021-08-15 ENCOUNTER — Telehealth: Payer: Self-pay | Admitting: Adult Health

## 2021-08-15 NOTE — Telephone Encounter (Signed)
Patient's husband called because patient is nauseous after taking her sitaGLIPtin (JANUVIA) 50 MG tablet. Husband states that [name] had recently doubled her dose and it is making her nauseous. Per [facility] patient was sent to triage.      Please advise

## 2021-08-15 NOTE — Telephone Encounter (Signed)
Left message for the pt to return my call. 

## 2021-08-16 NOTE — Telephone Encounter (Signed)
Pt informed of the message and verbalized understanding  

## 2021-09-05 ENCOUNTER — Telehealth: Payer: Self-pay | Admitting: Physical Medicine and Rehabilitation

## 2021-09-05 NOTE — Telephone Encounter (Signed)
Pt called and would like to schedule another Left L5-S1 interlaminar epidural steroid injection. She states injection helped 80% back in November.   CB [phone]

## 2021-09-10 ENCOUNTER — Other Ambulatory Visit: Payer: Self-pay | Admitting: Physical Medicine and Rehabilitation

## 2021-09-10 DIAGNOSIS — M5416 Radiculopathy, lumbar region: Secondary | ICD-10-CM

## 2021-09-10 NOTE — Telephone Encounter (Signed)
Talked to pt and it lasted since she got her last one on 07/18/2021.

## 2021-09-10 NOTE — Telephone Encounter (Signed)
Called pt. LVM#1  

## 2021-09-12 ENCOUNTER — Ambulatory Visit: Payer: Self-pay

## 2021-09-12 ENCOUNTER — Encounter: Payer: Self-pay | Admitting: Physical Medicine and Rehabilitation

## 2021-09-12 ENCOUNTER — Other Ambulatory Visit: Payer: Self-pay

## 2021-09-12 ENCOUNTER — Ambulatory Visit (INDEPENDENT_AMBULATORY_CARE_PROVIDER_SITE_OTHER): Payer: Medicare Other | Admitting: Physical Medicine and Rehabilitation

## 2021-09-12 VITALS — BP 139/76 | HR 76

## 2021-09-12 DIAGNOSIS — G8929 Other chronic pain: Secondary | ICD-10-CM | POA: Diagnosis not present

## 2021-09-12 DIAGNOSIS — M961 Postlaminectomy syndrome, not elsewhere classified: Secondary | ICD-10-CM

## 2021-09-12 DIAGNOSIS — M5416 Radiculopathy, lumbar region: Secondary | ICD-10-CM

## 2021-09-12 DIAGNOSIS — M25511 Pain in right shoulder: Secondary | ICD-10-CM | POA: Diagnosis not present

## 2021-09-12 DIAGNOSIS — G894 Chronic pain syndrome: Secondary | ICD-10-CM

## 2021-09-12 MED ORDER — METHYLPREDNISOLONE ACETATE 80 MG/ML IJ SUSP: 80.0000 mg | Freq: Once | INTRAMUSCULAR | Status: DC

## 2021-09-12 NOTE — Progress Notes (Signed)
t state lower back pain that travels down her left leg. Pt state walking, standing and laying down makes the pain worse. Pt state she takes over the counter pain meds to help ease her pain.  Numeric Pain Rating Scale and Functional Assessment Average Pain 7   In the last MONTH (on 0-10 scale) has pain interfered with the following?  1. General activity like being  able to carry out your everyday physical activities such as walking, climbing stairs, carrying groceries, or moving a chair?  Rating(10)   +Driver, -BT, -Dye Allergies.

## 2021-09-12 NOTE — Progress Notes (Signed)
Ann Thompson - 81 y.o. female MRN [id number]  Date of birth: 1941-02-07  Office Visit Note: Visit Date: 09/12/2021 PCP: Dorothyann Peng, NP Referred by: Dorothyann Peng, NP  Subjective: Chief Complaint  Patient presents with   Lower Back - Pain   Left Leg - Pain   Right Shoulder - Pain   HPI: Ann Thompson is a 81 y.o. female who comes in today for evaluation and management of chronic worsening severe right shoulder pain and left hip and leg pain.  We actually thought she is coming in today more concerned over the recalcitrant left hip and leg pain consistent with L5 radiculopathy radiculitis.  This is comanaged with Dr. Sherley Bounds.  By way of review she has had laminectomy decompression.  She has had MRI evidence of residual disc and lateral recess narrowing from an L4-5 surgery.  She has had multiple L5 transforaminal injections by myself as well as L5-S1 interlaminar injections by Dr. Suella Broad and at least one occasion by Dr. Brien Few.  She has had electrodiagnostic study showing normal findings but this can be normal and a sensory only or demyelinating radiculopathy.  She continues to have severe left hip and leg pain.  Diagnostically gets relief with L5 transforaminal injection or as Dr. Ronnald Ramp refers to this as selective nerve root block.  Essentially those of the same injections.  She has not had any new focal weakness or other changes.  Is continued pain.  Her case is complicated by type 2 diabetes.  She also complains of continued worsening right shoulder pain.  Prior intra-articular injection by myself gave her quite a bit of relief for several months.  She reports worsening symptoms without injury.  No radicular complaints in the arms.  No paresthesias no weakness.  She has not had orthopedic follow-up of her right shoulder.     Review of Systems  Musculoskeletal:  Positive for joint pain.       Left hip and leg pain.  Neurological:  Positive for tingling.  All other systems  reviewed and are negative. Otherwise per HPI.  Assessment & Plan: Visit Diagnoses:    ICD-10-CM   1. Chronic right shoulder pain  M25.511 Large Joint Inj: R glenohumeral   G89.29     2. 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 Chronic pain syndrome  G89.4        Plan: Findings:  1.  Her biggest complaint today from a pain standpoint is her right shoulder.  She has arthritic changes and impingement syndrome.  Likely at her age has some chronic rotator cuff tearing.  We will go ahead today and repeat the intra-articular injection of the right glenohumeral joint with fluoroscopic guidance as it did give her relief.  Have encouraged her to follow-up with Dr. Anderson Malta in our office for further evaluation and management.  2.  In terms of her recalcitrant hip and leg pain status post lumbar laminectomy and microdiscectomy at L4-5 she continues to have complaints despite treatment with medications and injections and therapy.  The only thing I have to offer at this point is spinal cord stimulator trial and I do think she would be a decent candidate for that.  We had a long discussion about that today.  This would  be essentially a procedure of last resort if there is no further surgery indicated by Dr. Ronnald Ramp.  Patient does want to look into this and we gave her some information I will have her be contacted by Rachel Bo from [employer].   Meds & Orders:  Meds ordered this encounter  Medications   DISCONTD: methylPREDNISolone acetate (DEPO-MEDROL) injection 80 mg      Follow-up: Return if symptoms worsen or fail to improve.   Procedures: Large Joint Inj: R glenohumeral on 09/12/2021 10:39 AM Indications: pain and diagnostic evaluation Details: 22 G 3.5 in needle, fluoroscopy-guided  anteromedial approach  Arthrogram: No  Medications: 40 mg triamcinolone acetonide 40 MG/ML; 5 mL bupivacaine 0.25 % Outcome: tolerated well, no immediate complications  There was excellent flow of contrast producing a partial arthrogram of the glenohumeral joint. The patient did have relief of symptoms during the anesthetic phase of the injection. Procedure, treatment alternatives, risks and benefits explained, specific risks discussed. Consent was given by the patient. Immediately prior to procedure a time out was called to verify the correct patient, procedure, equipment, support staff and site/side marked as required. Patient was prepped and draped in the usual sterile fashion.         Clinical History: CLINICAL DATA:  Lumbar radiculopathy. Additional history provided by scanning technologist: Patient reports low back pain with left leg pain/weakness/numbness since July 2021. History of lumbar surgery.   EXAM: MRI LUMBAR SPINE WITHOUT AND WITH CONTRAST   CONTRAST:  63mL MULTIHANCE GADOBENATE DIMEGLUMINE 529 MG/ML IV SOLN     FINDINGS:   Alignment: Trace retrolisthesis at L1-L2, L2-L3 and L3-L4, unchanged.   Vertebrae: Vertebral body height is maintained. Trace degenerative endplate edema and enhancement at L4-L5. Additionally, there is edema and enhancement along the left greater than right L4-L5 facet joints, likely degenerative. Multilevel degenerative endplate irregularity with small Schmorl nodes. Moderate fatty degenerative endplate marrow signal at L5-S1.   Conus medullaris and cauda equina: Conus extends to the L1-L2 level. No signal abnormality within the visualized distal spinal cord.   Paraspinal and other soft tissues: Multiple bilateral cystic renal lesions more fully characterized on the prior CT abdomen/pelvis of 10/06/2020. Redemonstrated right nephrolithiasis. Postsurgical changes to the lower dorsal paraspinal soft tissues.   Disc levels:   L3-L4:  Trace retrolisthesis. Disc bulge. Superimposed broad-based right subarticular to right extraforaminal disc extrusion with slight caudal migration. Mild facet arthrosis. As before, the disc protrusion contributes to moderate right subarticular stenosis, encroaching upon the descending right L4 nerve root. Central canal patent. The disc protrusion also contributes to moderate right neural foraminal narrowing, unchanged. Mild left neural foraminal narrowing, unchanged.   L4-L5: Interval left laminectomy and discectomy. Disc bulge with endplate spurring. Superimposed small residual/recurrent left subarticular disc extrusion with slight caudal migration. Moderate facet arthrosis. Although improved, there is persistent left subarticular narrowing with the disc extrusion contacting and posteriorly displacing the descending left L5 nerve root (series 13, image 30). Minimal relative right subarticular and central canal narrowing. Moderate bilateral neural foraminal narrowing (progressed on the right).   L5-S1: Disc bulge with endplate spurring. Superimposed central disc protrusion slightly eccentric to the right. Mild-to-moderate facet arthrosis. As before, the disc protrusion contributes to mild bilateral subarticular narrowing without frank nerve root impingement. Central canal patent. Bilateral neural foraminal narrowing (moderate/severe right, moderate left).   IMPRESSION: Comparison is made to the prior lumbar spine MRI of 04/05/2020.   At L4-L5, there has been interval left laminectomy and discectomy. There is a small  residual/recurrent left subarticular disc extrusion with slight caudal migration. Although improved, there is persistent left subarticular narrowing with the disc extrusion contacting and posteriorly displacing the descending left L5 nerve root. Multifactorial moderate bilateral neural foraminal narrowing (progressed on the right).   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   At L3-L4, a caudally migrated broad-based right subarticular to right extraforaminal disc extrusion contributes to moderate right subarticular stenosis, encroaching upon the descending right L4 nerve root. It also contributes to moderate right neural foraminal narrowing.   At L5-S1, there is advanced disc degeneration. A small central disc protrusion contributes to mild bilateral subarticular narrowing without appreciable nerve root impingement. Multifactorial bilateral neural foraminal narrowing (moderate/severe right, moderate left).   Right nephrolithiasis.   Bilateral cystic renal lesions more fully characterized on the CT abdomen/pelvis of 10/06/2020.     Electronically Signed   By: Kellie Simmering DO   On: 10/26/2020 08:30   She reports that she quit smoking about 40 years ago. Her smoking use included cigarettes. She has a 25.00 pack-year smoking history. She has never used smokeless tobacco.  Recent Labs    12/20/20 1034 06/21/21 1317 09/25/21 1044  HGBA1C 6.9* 7.7* 7.8*    Objective:  VS:  HT:     WT:    BMI:      BP:139/76   HR:76bpm   TEMP: ( )   RESP:  Physical Exam Vitals and nursing note reviewed.  Constitutional:      General: She is not in acute distress.    Appearance: Normal appearance. She is not ill-appearing.  HENT:     Head: Normocephalic and atraumatic.     Right Ear: External ear normal.     Left Ear: External ear normal.  Eyes:     Extraocular Movements: Extraocular movements intact.  Cardiovascular:     Rate and Rhythm: Normal rate.     Pulses: Normal pulses.  Pulmonary:     Effort: Pulmonary effort is normal. No respiratory distress.  Abdominal:     General: There is no distension.     Palpations: Abdomen is soft.  Musculoskeletal:        General: Tenderness present.     Cervical back: Neck supple.     Right lower leg: No edema.     Left lower leg: No edema.     Comments:  Patient has good distal strength with no pain over the greater trochanters.  No clonus or focal weakness.  She does have dysesthesias in her left L5 dermatome.  She has a equivocally positive slump test on the left.  She has good hip range of motion without groin pain.  Examination of the right shoulder shows pain with external rotation and some impingement sign.  Negative drop arm test.  Skin:    Findings: No erythema, lesion or rash.  Neurological:     General: No focal deficit present.     Mental Status: She is alert and oriented to person, place, and time.     Sensory: No sensory deficit.     Motor: No weakness or abnormal muscle tone.     Coordination: Coordination normal.  Psychiatric:        Mood and Affect: Mood normal.        Behavior: Behavior normal.    Ortho Exam  Imaging: No results found.  Past Medical/Family/Surgical/Social History: Medications & Allergies reviewed per EMR, new medications updated. 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   Past Medical History:  Diagnosis Date   Arthritis    DM type 2 (diabetes mellitus, type 2) (HCC)    GERD (gastroesophageal reflux disease)    History of kidney stones    Hyperlipemia    Hypertension    Left leg pain    Wears hearing aid in both ears    Family History  Family history unknown: Yes   Past Surgical History:  Procedure Laterality Date    ABDOMINAL HYSTERECTOMY  1982   partial   [place] SURGERY  07/12/2020   lower at [facility]   [place] Right 10/06/2020   Procedure: CYSTOSCOPY WITH RETROGRADE PYELOGRAM/URETERAL STENT PLACEMENT;  Surgeon: Alexis Frock, MD;  Location: [facility];  Service: Urology;  Laterality: Right;   CYSTOSCOPY WITH RETROGRADE PYELOGRAM, URETEROSCOPY AND STENT PLACEMENT Right 10/26/2020   Procedure: CYSTOSCOPY WITH RETROGRADE PYELOGRAM, URETEROSCOPY AND STENT REPLACEMENT;  Surgeon: Alexis Frock, MD;  Location: [facility];  Service: Urology;  Laterality: Right;  90 MINS   HOLMIUM LASER APPLICATION Right [id number]   Procedure: HOLMIUM LASER APPLICATION;  Surgeon: Alexis Frock, MD;  Location: [facility];  Service: Urology;  Laterality: Right;   JOINT REPLACEMENT  2016   both knees partial replacement   Social History   Occupational History   Not on file  Tobacco Use   Smoking status: Former    Packs/day: 1.00    Years: 25.00    Pack years: 25.00    Types: Cigarettes    Quit date: 09/08/1981    Years since quitting: 40.1   Smokeless tobacco: Never  Vaping Use   Vaping Use: Never used  Substance and Sexual Activity   Alcohol use: Never   Drug use: Never   Sexual activity: Not on file

## 2021-09-12 NOTE — Patient Instructions (Signed)

## 2021-09-17 ENCOUNTER — Ambulatory Visit (INDEPENDENT_AMBULATORY_CARE_PROVIDER_SITE_OTHER): Payer: Medicare Other | Admitting: Adult Health

## 2021-09-17 ENCOUNTER — Encounter: Payer: Self-pay | Admitting: Adult Health

## 2021-09-17 VITALS — BP 122/78 | HR 85 | Temp 97.9°F | Wt 174.0 lb

## 2021-09-17 DIAGNOSIS — G8929 Other chronic pain: Secondary | ICD-10-CM | POA: Diagnosis not present

## 2021-09-17 DIAGNOSIS — M545 Low back pain, unspecified: Secondary | ICD-10-CM

## 2021-09-17 DIAGNOSIS — M25511 Pain in right shoulder: Secondary | ICD-10-CM | POA: Diagnosis not present

## 2021-09-17 NOTE — Progress Notes (Signed)
Subjective:    Patient ID: Ann Thompson, female    DOB: December 12, 1940, 81 y.o.   MRN: [id number]  HPI  81 year old female who  has a past medical history of Arthritis, DM type 2 (diabetes mellitus, type 2) (HCC), GERD (gastroesophageal reflux disease), History of kidney stones, Hyperlipemia, Hypertension, Left leg pain, and Wears hearing aid in both ears.  She presents to the office today for follow-up regarding multiple joint pain.  She was seen 5 days ago by Dr. Alvester Morin and had a steroid injection into the right shoulder.  She reports that this worked well for her her pain is greatly diminished.  She also reports that she had some itching on her skin throughout her body that she wanted to see me about today but since having the steroid injection this has resolved as well.  Additionally, he has a long history of lumbar radiculopathy.  There is talk about placing a nerve stimulator in her back to see if this helps with her chronic pain.  She is wondering what I think of this.   Review of Systems See HPI   Past Medical History:  Diagnosis Date   Arthritis    DM type 2 (diabetes mellitus, type 2) (HCC)    GERD (gastroesophageal reflux disease)    History of kidney stones    Hyperlipemia    Hypertension    Left leg pain    Wears hearing aid in both ears     Social History   Socioeconomic History   Marital status: Married    Spouse name: Not on file   Number of children: Not on file   Years of education: Not on file   Highest education level: Not on file  Occupational History   Not on file  Tobacco Use   Smoking status: Former    Packs/day: 1.00    Years: 25.00    Pack years: 25.00    Types: Cigarettes    Quit date: 09/08/1981    Years since quitting: 40.0   Smokeless tobacco: Never  Vaping Use   Vaping Use: Never used  Substance and Sexual Activity   Alcohol use: Never   Drug use: Never   Sexual activity: Not on file  Other Topics Concern   Not on file  Social History  Narrative   Not on file   Social Determinants of Health   Financial Resource Strain: Low Risk    Difficulty of Paying Living Expenses: Not hard at all  Food Insecurity: No Food Insecurity   Worried About Programme researcher, broadcasting/film/video in the Last Year: Never true   Ran Out of Food in the Last Year: Never true  Transportation Needs: No Transportation Needs   Lack of Transportation (Medical): No   Lack of Transportation (Non-Medical): No  Physical Activity: Inactive   Days of Exercise per Week: 0 days   Minutes of Exercise per Session: 0 min  Stress: No Stress Concern Present   Feeling of Stress : Not at all  Social Connections: Moderately Isolated   Frequency of Communication with Friends and Family: Twice a week   Frequency of Social Gatherings with Friends and Family: Twice a week   Attends Religious Services: Never   Database administrator or Organizations: No   Attends Engineer, structural: Never   Marital Status: Married  Catering manager Violence: Not At Risk   Fear of Current or Ex-Partner: No   Emotionally Abused: No   Physically Abused: No  Sexually Abused: No    Past Surgical History:  Procedure Laterality Date   ABDOMINAL HYSTERECTOMY  1982   partial   APPENDECTOMY  1957   BACK SURGERY  07/12/2020   lower at [facility]   CYSTOSCOPY W/ URETERAL STENT PLACEMENT Right 10/06/2020   Procedure: CYSTOSCOPY WITH RETROGRADE PYELOGRAM/URETERAL STENT PLACEMENT;  Surgeon: Sebastian Ache, MD;  Location: [facility];  Service: Urology;  Laterality: Right;   CYSTOSCOPY WITH RETROGRADE PYELOGRAM, URETEROSCOPY AND STENT PLACEMENT Right 10/26/2020   Procedure: CYSTOSCOPY WITH RETROGRADE PYELOGRAM, URETEROSCOPY AND STENT REPLACEMENT;  Surgeon: Sebastian Ache, MD;  Location: [facility];  Service: Urology;  Laterality: Right;  90 MINS   HOLMIUM LASER APPLICATION Right 10/26/2020   Procedure: HOLMIUM LASER APPLICATION;  Surgeon: Sebastian Ache, MD;  Location: [facility];  Service: Urology;  Laterality: Right;   JOINT REPLACEMENT  2016   both knees partial replacement    Family History  Family history unknown: Yes    No Known Allergies  Current Outpatient Medications on File Prior to Visit  Medication Sig Dispense Refill   acetaminophen (TYLENOL) 500 MG tablet Take 1,000 mg by mouth every 6 (six) hours as needed.     bisoprolol-hydrochlorothiazide (ZIAC) 2.5-6.25 MG tablet TAKE 1 TABLET BY MOUTH EVERY DAY 90 tablet 3   celecoxib (CELEBREX) 200 MG capsule celecoxib 200 mg capsule  Take 1 capsule every day by oral route.     cycloSPORINE (RESTASIS) 0.05 % ophthalmic emulsion Restasis 0.05 % eye drops in a dropperette  Instill 1 drop IN EACH EYE EVERY TWELVE HOURS     diazepam (VALIUM) 5 MG tablet Take one tablet by mouth with food one hour prior to procedure. May repeat 30 minutes prior if needed. 2 tablet 0   fluticasone (FLONASE) 50 MCG/ACT nasal spray USE 2 SPRAYS IN EACH NOSTRIL EVERY DAY 16 g 5   Glucosamine-Chondroit-Vit C-Mn (GLUCOSAMINE 1500 COMPLEX) CAPS Take 1 capsule by mouth daily.     MAGNESIUM-POTASSIUM PO Take 1 tablet by mouth daily.     Multiple Vitamin (MULTIVITAMIN) tablet Take 1 tablet by mouth daily.     pramipexole (MIRAPEX) 1.5 MG tablet TAKE 1 TABLET BY MOUTH AT BEDTIME 90 tablet 1   sitaGLIPtin (JANUVIA) 50 MG tablet Take 1 tablet (50 mg total) by mouth daily. 90 tablet 0   Tetrahydrozoline-Zn Sulfate (EYE DROPS A/C OP) Apply 1 drop to eye daily as needed (dry eyes).     triamterene-hydrochlorothiazide (MAXZIDE-25) 37.5-25 MG tablet TAKE 1 TABLET BY MOUTH EVERY DAY 90 tablet 2   UNABLE TO FIND Med Name: cbd oil to knees prn     Current Facility-Administered Medications on File Prior to Visit  Medication Dose Route Frequency Provider Last Rate Last Admin   methylPREDNISolone acetate (DEPO-MEDROL) injection 80 mg  80 mg Other Once Tyrell Antonio, MD        BP 122/78    Pulse 85    Temp 97.9 F (36.6 C)  (Oral)    Wt 174 lb (78.9 kg)    SpO2 98%    BMI 33.98 kg/m       Objective:   Physical Exam Vitals and nursing note reviewed.  Constitutional:      Appearance: Normal appearance.  Cardiovascular:     Rate and Rhythm: Normal rate and regular rhythm.     Pulses: Normal pulses.     Heart sounds: Normal heart sounds.  Pulmonary:     Effort: Pulmonary effort is normal.  Breath sounds: Normal breath sounds.  Skin:    General: Skin is warm and dry.  Neurological:     General: No focal deficit present.     Mental Status: She is alert and oriented to person, place, and time.  Psychiatric:        Mood and Affect: Mood normal.        Behavior: Behavior normal.        Thought Content: Thought content normal.        Judgment: Judgment normal.       Assessment & Plan:  1. Pain, lumbar region -Advised patient that I do not know a whole lot about nerve stimulators, I do have some family members in patients that have undergone this procedure and it has worked well for her.  She has had surgery on her back before without resolution.  I think she would tolerate the procedure well and I think it is worth a try for her.  2. Chronic right shoulder pain -Hopefully the steroid injection continues to work for her.  Follow-up with orthopedics as directed  Shirline Freesory Liseth Wann, NP

## 2021-09-18 ENCOUNTER — Encounter: Payer: Self-pay | Admitting: Physical Medicine and Rehabilitation

## 2021-09-18 ENCOUNTER — Telehealth: Payer: Self-pay | Admitting: Physical Medicine and Rehabilitation

## 2021-09-18 DIAGNOSIS — M5416 Radiculopathy, lumbar region: Secondary | ICD-10-CM

## 2021-09-18 DIAGNOSIS — M961 Postlaminectomy syndrome, not elsewhere classified: Secondary | ICD-10-CM

## 2021-09-18 DIAGNOSIS — G894 Chronic pain syndrome: Secondary | ICD-10-CM

## 2021-09-18 NOTE — Telephone Encounter (Signed)
Pt called and said she is ready for "the spinal stimulation".  CB [phone]

## 2021-09-19 ENCOUNTER — Telehealth: Payer: Self-pay | Admitting: Physical Medicine and Rehabilitation

## 2021-09-19 NOTE — Telephone Encounter (Signed)
Pt called requesting a call back to set an appt. Please call pt at [phone].

## 2021-09-23 ENCOUNTER — Telehealth: Payer: Self-pay | Admitting: Physical Medicine and Rehabilitation

## 2021-09-23 ENCOUNTER — Other Ambulatory Visit: Payer: Self-pay | Admitting: Adult Health

## 2021-09-23 DIAGNOSIS — G2581 Restless legs syndrome: Secondary | ICD-10-CM

## 2021-09-23 NOTE — Telephone Encounter (Signed)
Pt husband called. They want to wait to do the simulator at a later date.   Cb [phone]

## 2021-09-25 ENCOUNTER — Ambulatory Visit (INDEPENDENT_AMBULATORY_CARE_PROVIDER_SITE_OTHER): Payer: Medicare Other | Admitting: Adult Health

## 2021-09-25 ENCOUNTER — Encounter: Payer: Self-pay | Admitting: Adult Health

## 2021-09-25 VITALS — BP 140/60 | HR 78 | Temp 97.8°F | Ht 60.0 in | Wt 174.0 lb

## 2021-09-25 DIAGNOSIS — I10 Essential (primary) hypertension: Secondary | ICD-10-CM | POA: Diagnosis not present

## 2021-09-25 DIAGNOSIS — E118 Type 2 diabetes mellitus with unspecified complications: Secondary | ICD-10-CM

## 2021-09-25 DIAGNOSIS — G2581 Restless legs syndrome: Secondary | ICD-10-CM | POA: Diagnosis not present

## 2021-09-25 DIAGNOSIS — M5416 Radiculopathy, lumbar region: Secondary | ICD-10-CM

## 2021-09-25 LAB — CBC WITH DIFFERENTIAL/PLATELET
Basophils Absolute: 0 10*3/uL (ref 0.0–0.1)
Basophils Relative: 0.4 % (ref 0.0–3.0)
Eosinophils Absolute: 0.3 10*3/uL (ref 0.0–0.7)
Eosinophils Relative: 4.1 % (ref 0.0–5.0)
HCT: 42.2 % (ref 36.0–46.0)
Hemoglobin: 14 g/dL (ref 12.0–15.0)
Lymphocytes Relative: 22.2 % (ref 12.0–46.0)
Lymphs Abs: 1.4 10*3/uL (ref 0.7–4.0)
MCHC: 33.1 g/dL (ref 30.0–36.0)
MCV: 100.3 fl — ABNORMAL HIGH (ref 78.0–100.0)
Monocytes Absolute: 0.6 10*3/uL (ref 0.1–1.0)
Monocytes Relative: 8.9 % (ref 3.0–12.0)
Neutro Abs: 4 10*3/uL (ref 1.4–7.7)
Neutrophils Relative %: 64.4 % (ref 43.0–77.0)
Platelets: 217 10*3/uL (ref 150.0–400.0)
RBC: 4.21 Mil/uL (ref 3.87–5.11)
RDW: 12.7 % (ref 11.5–15.5)
WBC: 6.3 10*3/uL (ref 4.0–10.5)

## 2021-09-25 LAB — LIPID PANEL
Cholesterol: 182 mg/dL (ref 0–200)
HDL: 49.3 mg/dL (ref >39.00)
NonHDL: 132.76
Total CHOL/HDL Ratio: 4
Triglycerides: 292 mg/dL — ABNORMAL HIGH (ref 0.0–149.0)
VLDL: 58.4 mg/dL — ABNORMAL HIGH (ref 0.0–40.0)

## 2021-09-25 LAB — COMPREHENSIVE METABOLIC PANEL
ALT: 25 U/L (ref 0–35)
AST: 21 U/L (ref 0–37)
Albumin: 4.2 g/dL (ref 3.5–5.2)
Alkaline Phosphatase: 94 U/L (ref 39–117)
BUN: 14 mg/dL (ref 6–23)
CO2: 32 mEq/L (ref 19–32)
Calcium: 10.9 mg/dL — ABNORMAL HIGH (ref 8.4–10.5)
Chloride: 98 mEq/L (ref 96–112)
Creatinine, Ser: 0.8 mg/dL (ref 0.40–1.20)
GFR: 69.11 mL/min (ref >60.00)
Glucose, Bld: 190 mg/dL — ABNORMAL HIGH (ref 70–99)
Potassium: 4.2 mEq/L (ref 3.5–5.1)
Sodium: 137 mEq/L (ref 135–145)
Total Bilirubin: 0.4 mg/dL (ref 0.2–1.2)
Total Protein: 7.2 g/dL (ref 6.0–8.3)

## 2021-09-25 LAB — HEMOGLOBIN A1C: Hgb A1c MFr Bld: 7.8 % — ABNORMAL HIGH (ref 4.6–6.5)

## 2021-09-25 LAB — LDL CHOLESTEROL, DIRECT: Direct LDL: 91 mg/dL

## 2021-09-25 LAB — TSH: TSH: 0.84 u[IU]/mL (ref 0.35–5.50)

## 2021-09-25 NOTE — Progress Notes (Signed)
Subjective:    Patient ID: Ann Thompson, female    DOB: June 21, 1941, 81 y.o.   MRN: [id number]  HPI Patient presents for yearly preventative medicine examination. She is a pleasant 81 year old female who  has a past medical history of Arthritis, DM type 2 (diabetes mellitus, type 2) (HCC), GERD (gastroesophageal reflux disease), History of kidney stones, Hyperlipemia, Hypertension, Left leg pain, and Wears hearing aid in both ears.  Hypertension-currently prescribed Ziac 2.5-6.25 mg daily and Dyazide 37.5-25 mg daily.  She denies dizziness, lightheadedness, chest pain, or shortness of breath. She did not take her blood pressure medication this morning.  BP Readings from Last 3 Encounters:  09/25/21 (!) 142/80  09/17/21 122/78  09/12/21 139/76   Diabetes mellitus type 2-takes Januvia 50 mg daily.  She does not check her blood sugars at home.  Denies episodes of hypoglycemia Lab Results  Component Value Date   HGBA1C 7.7 (A) 06/21/2021   Restless leg syndrome-takes Mirapex 1 mg nightly.  Feels as though this works well for her, is able to sleep through the night.  Chronic lumbar radiculopathy-is seen by physical medicine and rehab.  Has had multiple steroid injections and ablation but continues to have significant pain.  Plan going forward is for her to have spinal stimulator placed but she is now going to wait on this until the spring.   All immunizations and health maintenance protocols were reviewed with the patient and needed orders were placed.  Appropriate screening laboratory values were ordered for the patient including screening of hyperlipidemia, renal function and hepatic function.   Medication reconciliation,  past medical history, social history, problem list and allergies were reviewed in detail with the patient  Goals were established with regard to weight loss, exercise, and  diet in compliance with medications Wt Readings from Last 3 Encounters:  09/25/21 174 lb (78.9  kg)  09/17/21 174 lb (78.9 kg)  06/21/21 107 lb (48.5 kg)   Review of Systems  Constitutional: Negative.   HENT: Negative.    Eyes: Negative.   Respiratory: Negative.    Cardiovascular: Negative.   Gastrointestinal: Negative.   Endocrine: Negative.   Genitourinary: Negative.   Musculoskeletal:  Positive for arthralgias and back pain.  Skin: Negative.   Allergic/Immunologic: Negative.   Neurological: Negative.   Hematological: Negative.   Psychiatric/Behavioral: Negative.    Past Medical History:  Diagnosis Date   Arthritis    DM type 2 (diabetes mellitus, type 2) (HCC)    GERD (gastroesophageal reflux disease)    History of kidney stones    Hyperlipemia    Hypertension    Left leg pain    Wears hearing aid in both ears     Social History   Socioeconomic History   Marital status: Married    Spouse name: Not on file   Number of children: Not on file   Years of education: Not on file   Highest education level: Not on file  Occupational History   Not on file  Tobacco Use   Smoking status: Former    Packs/day: 1.00    Years: 25.00    Pack years: 25.00    Types: Cigarettes    Quit date: 09/08/1981    Years since quitting: 40.0   Smokeless tobacco: Never  Vaping Use   Vaping Use: Never used  Substance and Sexual Activity   Alcohol use: Never   Drug use: Never   Sexual activity: Not on file  Other Topics Concern  Not on file  Social History Narrative   Not on file   Social Determinants of Health   Financial Resource Strain: Low Risk    Difficulty of Paying Living Expenses: Not hard at all  Food Insecurity: No Food Insecurity   Worried About Programme researcher, broadcasting/film/video in the Last Year: Never true   Barista in the Last Year: Never true  Transportation Needs: No Transportation Needs   Lack of Transportation (Medical): No   Lack of Transportation (Non-Medical): No  Physical Activity: Inactive   Days of Exercise per Week: 0 days   Minutes of Exercise per  Session: 0 min  Stress: No Stress Concern Present   Feeling of Stress : Not at all  Social Connections: Moderately Isolated   Frequency of Communication with Friends and Family: Twice a week   Frequency of Social Gatherings with Friends and Family: Twice a week   Attends Religious Services: Never   Database administrator or Organizations: No   Attends Engineer, structural: Never   Marital Status: Married  Catering manager Violence: Not At Risk   Fear of Current or Ex-Partner: No   Emotionally Abused: No   Physically Abused: No   Sexually Abused: No    Past Surgical History:  Procedure Laterality Date   ABDOMINAL HYSTERECTOMY  1982   partial   APPENDECTOMY  1957   BACK SURGERY  07/12/2020   lower at [facility]   CYSTOSCOPY W/ URETERAL STENT PLACEMENT Right 10/06/2020   Procedure: CYSTOSCOPY WITH RETROGRADE PYELOGRAM/URETERAL STENT PLACEMENT;  Surgeon: Sebastian Ache, MD;  Location: [facility] OR;  Service: Urology;  Laterality: Right;   CYSTOSCOPY WITH RETROGRADE PYELOGRAM, URETEROSCOPY AND STENT PLACEMENT Right 10/26/2020   Procedure: CYSTOSCOPY WITH RETROGRADE PYELOGRAM, URETEROSCOPY AND STENT REPLACEMENT;  Surgeon: Sebastian Ache, MD;  Location: [facility];  Service: Urology;  Laterality: Right;  90 MINS   HOLMIUM LASER APPLICATION Right 10/26/2020   Procedure: HOLMIUM LASER APPLICATION;  Surgeon: Sebastian Ache, MD;  Location: [facility];  Service: Urology;  Laterality: Right;   JOINT REPLACEMENT  2016   both knees partial replacement    Family History  Family history unknown: Yes    No Known Allergies  Current Outpatient Medications on File Prior to Visit  Medication Sig Dispense Refill   acetaminophen (TYLENOL) 500 MG tablet Take 1,000 mg by mouth every 6 (six) hours as needed.     bisoprolol-hydrochlorothiazide (ZIAC) 2.5-6.25 MG tablet TAKE 1 TABLET BY MOUTH EVERY DAY 90 tablet 3   celecoxib (CELEBREX) 200 MG capsule  celecoxib 200 mg capsule  Take 1 capsule every day by oral route.     cycloSPORINE (RESTASIS) 0.05 % ophthalmic emulsion Restasis 0.05 % eye drops in a dropperette  Instill 1 drop IN EACH EYE EVERY TWELVE HOURS     diazepam (VALIUM) 5 MG tablet Take one tablet by mouth with food one hour prior to procedure. May repeat 30 minutes prior if needed. 2 tablet 0   fluticasone (FLONASE) 50 MCG/ACT nasal spray USE 2 SPRAYS IN EACH NOSTRIL EVERY DAY 16 g 5   Glucosamine-Chondroit-Vit C-Mn (GLUCOSAMINE 1500 COMPLEX) CAPS Take 1 capsule by mouth daily.     MAGNESIUM-POTASSIUM PO Take 1 tablet by mouth daily.     Multiple Vitamin (MULTIVITAMIN) tablet Take 1 tablet by mouth daily.     pramipexole (MIRAPEX) 1.5 MG tablet TAKE 1 TABLET BY MOUTH NIGHTLY AT BEDTIME 90 tablet 1   sitaGLIPtin (JANUVIA)  50 MG tablet Take 1 tablet (50 mg total) by mouth daily. 90 tablet 0   Tetrahydrozoline-Zn Sulfate (EYE DROPS A/C OP) Apply 1 drop to eye daily as needed (dry eyes).     triamterene-hydrochlorothiazide (MAXZIDE-25) 37.5-25 MG tablet TAKE 1 TABLET BY MOUTH EVERY DAY 90 tablet 2   UNABLE TO FIND Med Name: cbd oil to knees prn     Current Facility-Administered Medications on File Prior to Visit  Medication Dose Route Frequency Provider Last Rate Last Admin   methylPREDNISolone acetate (DEPO-MEDROL) injection 80 mg  80 mg Other Once Tyrell Antonio, MD        BP (!) 142/80    Pulse 78    Temp 97.8 F (36.6 C) (Oral)    Ht 5' (1.524 m)    Wt 174 lb (78.9 kg)    SpO2 94%    BMI 33.98 kg/m       Objective:   Physical Exam Vitals and nursing note reviewed.  Constitutional:      General: She is not in acute distress.    Appearance: Normal appearance. She is well-developed. She is not ill-appearing.  HENT:     Head: Normocephalic and atraumatic.     Right Ear: Tympanic membrane, ear canal and external ear normal. There is no impacted cerumen.     Left Ear: Tympanic membrane, ear canal and external ear normal.  There is no impacted cerumen.     Nose: Nose normal. No congestion or rhinorrhea.     Mouth/Throat:     Mouth: Mucous membranes are moist.     Pharynx: Oropharynx is clear. No oropharyngeal exudate or posterior oropharyngeal erythema.  Eyes:     General:        Right eye: No discharge.        Left eye: No discharge.     Extraocular Movements: Extraocular movements intact.     Conjunctiva/sclera: Conjunctivae normal.     Pupils: Pupils are equal, round, and reactive to light.  Neck:     Thyroid: No thyromegaly.     Vascular: No carotid bruit.     Trachea: No tracheal deviation.  Cardiovascular:     Rate and Rhythm: Normal rate and regular rhythm.     Pulses: Normal pulses.     Heart sounds: Normal heart sounds. No murmur heard.   No friction rub. No gallop.  Pulmonary:     Effort: Pulmonary effort is normal. No respiratory distress.     Breath sounds: Normal breath sounds. No stridor. No wheezing, rhonchi or rales.  Chest:     Chest wall: No tenderness.  Abdominal:     General: Abdomen is flat. Bowel sounds are normal. There is no distension.     Palpations: Abdomen is soft. There is no mass.     Tenderness: There is no abdominal tenderness. There is no right CVA tenderness, left CVA tenderness, guarding or rebound.     Hernia: No hernia is present.  Musculoskeletal:        General: No swelling, tenderness, deformity or signs of injury. Normal range of motion.     Cervical back: Normal range of motion and neck supple.     Right lower leg: No edema.     Left lower leg: No edema.  Lymphadenopathy:     Cervical: No cervical adenopathy.  Skin:    General: Skin is warm and dry.     Coloration: Skin is not jaundiced or pale.     Findings: No bruising,  erythema, lesion or rash.  Neurological:     General: No focal deficit present.     Mental Status: She is alert and oriented to person, place, and time.     Cranial Nerves: No cranial nerve deficit.     Sensory: No sensory  deficit.     Motor: No weakness.     Coordination: Coordination normal.     Gait: Gait normal.     Deep Tendon Reflexes: Reflexes normal.  Psychiatric:        Mood and Affect: Mood normal.        Behavior: Behavior normal.        Thought Content: Thought content normal.        Judgment: Judgment normal.      Assessment & Plan:   1. Controlled type 2 diabetes mellitus with complication, without long-term current use of insulin (HCC) - Consider adding agent - Follow up in three months or sooner if needed - CBC with Differential/Platelet; Future - Hemoglobin A1c; Future - Comprehensive metabolic panel; Future - Lipid panel; Future - TSH; Future - CBC with Differential/Platelet - Hemoglobin A1c - Comprehensive metabolic panel - Lipid panel - TSH  2. Primary hypertension - Controlled. No change in medications  - CBC with Differential/Platelet; Future - Hemoglobin A1c; Future - Comprehensive metabolic panel; Future - Lipid panel; Future - TSH; Future - CBC with Differential/Platelet - Hemoglobin A1c - Comprehensive metabolic panel - Lipid panel - TSH  3. Lumbar radiculopathy - Follow up with Physical medicine and rehab as directed - CBC with Differential/Platelet; Future - Hemoglobin A1c; Future - Comprehensive metabolic panel; Future - Lipid panel; Future - TSH; Future - CBC with Differential/Platelet - Hemoglobin A1c - Comprehensive metabolic panel - Lipid panel - TSH  4. Restless leg syndrome - Continue with current therapy  - CBC with Differential/Platelet; Future - Hemoglobin A1c; Future - Comprehensive metabolic panel; Future - Lipid panel; Future - TSH; Future - CBC with Differential/Platelet - Hemoglobin A1c - Comprehensive metabolic panel - Lipid panel - TSH  Shirline Freesory Lamari Beckles, NP

## 2021-09-25 NOTE — Patient Instructions (Signed)
It was great seeing you today   We will follow up with you regarding your lab work   Please let me know if you need anything   I will see you back in three months for diabetic follow up

## 2021-09-27 ENCOUNTER — Other Ambulatory Visit: Payer: Self-pay | Admitting: Adult Health

## 2021-09-27 ENCOUNTER — Other Ambulatory Visit: Payer: Self-pay

## 2021-09-27 MED ORDER — GLIPIZIDE 5 MG PO TABS: 2.5000 mg | ORAL_TABLET | Freq: Every day | ORAL | 3 refills | Status: DC

## 2021-09-27 MED ORDER — GLIPIZIDE ER 2.5 MG PO TB24: 2.5000 mg | ORAL_TABLET | Freq: Every day | ORAL | 0 refills | Status: DC

## 2021-09-27 MED ORDER — SIMVASTATIN 10 MG PO TABS: 10.0000 mg | ORAL_TABLET | Freq: Every day | ORAL | 3 refills | Status: DC

## 2021-09-30 ENCOUNTER — Encounter: Payer: Self-pay | Admitting: Adult Health

## 2021-10-03 ENCOUNTER — Encounter: Payer: Self-pay | Admitting: Adult Health

## 2021-10-03 NOTE — Telephone Encounter (Signed)
Please advise.  Per result notes from 09/25/2021 "Cholesterol panel is also higher than what we would like for a diabetic."  Patient would like further explanation.

## 2021-10-04 NOTE — Telephone Encounter (Signed)
Please advise 

## 2021-10-19 ENCOUNTER — Encounter: Payer: Self-pay | Admitting: Physical Medicine and Rehabilitation

## 2021-10-19 MED ORDER — TRIAMCINOLONE ACETONIDE 40 MG/ML IJ SUSP
40.0000 mg | INTRAMUSCULAR | Status: AC | PRN
  Administered 2021-09-12: 40 mg via INTRA_ARTICULAR

## 2021-10-19 MED ORDER — BUPIVACAINE HCL 0.25 % IJ SOLN
5.0000 mL | INTRAMUSCULAR | Status: AC | PRN
  Administered 2021-09-12: 5 mL via INTRA_ARTICULAR

## 2021-11-05 ENCOUNTER — Other Ambulatory Visit: Payer: Self-pay | Admitting: Adult Health

## 2021-11-05 DIAGNOSIS — J302 Other seasonal allergic rhinitis: Secondary | ICD-10-CM

## 2021-11-11 ENCOUNTER — Other Ambulatory Visit: Payer: Self-pay | Admitting: Adult Health

## 2021-11-11 DIAGNOSIS — E118 Type 2 diabetes mellitus with unspecified complications: Secondary | ICD-10-CM

## 2021-11-12 ENCOUNTER — Encounter: Payer: Self-pay | Admitting: Physical Medicine and Rehabilitation

## 2021-11-26 ENCOUNTER — Other Ambulatory Visit: Payer: Self-pay

## 2021-11-26 ENCOUNTER — Encounter: Payer: Self-pay | Admitting: Physical Medicine and Rehabilitation

## 2021-11-26 ENCOUNTER — Ambulatory Visit (INDEPENDENT_AMBULATORY_CARE_PROVIDER_SITE_OTHER): Payer: Medicare Other | Admitting: Physical Medicine and Rehabilitation

## 2021-11-26 VITALS — BP 138/82 | HR 82

## 2021-11-26 DIAGNOSIS — M5416 Radiculopathy, lumbar region: Secondary | ICD-10-CM | POA: Diagnosis not present

## 2021-11-26 DIAGNOSIS — M5116 Intervertebral disc disorders with radiculopathy, lumbar region: Secondary | ICD-10-CM | POA: Diagnosis not present

## 2021-11-26 DIAGNOSIS — M961 Postlaminectomy syndrome, not elsewhere classified: Secondary | ICD-10-CM

## 2021-11-26 DIAGNOSIS — G894 Chronic pain syndrome: Secondary | ICD-10-CM | POA: Diagnosis not present

## 2021-11-26 NOTE — Progress Notes (Signed)
Patient presents today for her lower back pain which has been radiating down her left side. She states that she is currently taking OTC tylenol for pain relief which has been helping. Patient reports to have not had any recent injuries or falls. She has had previous back surgery.  ?

## 2021-11-26 NOTE — Progress Notes (Signed)
Ann Thompson - 81 y.o. female MRN [id number]  Date of birth: 02/01/1941  Office Visit Note: Visit Date: 11/26/2021 PCP: [name], NP Referred by: [name], NP  Subjective: Chief Complaint  Patient presents with   Lower Back - Follow-up   HPI: Ann Thompson is a 81 y.o. female who comes in today for evaluation of chronic, worsening and severe bilateral lower back pain radiating to left buttock/hip and down leg. Patient reports pain has been ongoing for several years and is exacerbated by walking and activity. She describes pain as constant sore and shooting sensation, currently rates as 7 out of 10. Patient reports some relief of pain with home exercise regimen, ice/heat, TENS unit and rest. Patient also reports recent massage therapy and formal physical therapy at [facility] and reports these treatments made her pain worse. Patients lumbar MRI from 2022 exhibits postsurgical changes from left laminectomy and micro discectomy at L4-L5. There is also recurrent left central to foraminal disc extrusion at L4-5 with severe narrowing of the left subarticular zone and posterior displacement of the traversing left L5 nerve root.  Patient had left L4-L5 microdiskectomy in 2021 by Dr. Marikay Alar, patient reports she feels this surgery did not help to alleviate pain. Patient has been treated by both Dr. Callie Fielding and Dr. Sheran Luz in the past where she received multiple lumbar epidural steroid injections without significant relief. She did have NCV with EMG of left lower extremity by Dr. Callie Fielding, this study was normal. We have also performed multiple epidural steroid injections in our office with minimal to no relief.    Patient recently called in and inquired about spinal cord stimulator trial/placement and voiced that she is interested in having this procedure. I did arrange for her to speak to [facility] with [employer]. Patient voiced at that time that she  would like to think and discuss with family before moving forward.  Patient denies focal weakness, numbness and tingling. Patient denies recent trauma or falls.   Review of Systems  Musculoskeletal:  Positive for back pain.  Neurological:  Negative for tingling, sensory change, focal weakness and weakness.  All other systems reviewed and are negative. Otherwise per HPI.  Assessment & Plan: Visit Diagnoses:    ICD-10-CM   1. Lumbar radiculopathy  M54.16     2. Radiculopathy due to lumbar intervertebral disc disorder  M51.16     3. Post laminectomy syndrome  M96.1     4. Chronic pain syndrome  G89.4        Plan: Findings:  Chronic, worsening and severe bilateral lower back pain radiating to left buttock/hip and down leg.  Patient continues to have excruciating and debilitating pain despite good conservative therapies such as formal physical therapy, massage therapy, home exercise regimen and rest.  Patient's clinical presentation and exam are consistent with L5 nerve pattern.  Patients clinical course is complex, she has had multiple lumbar epidural steroid injections performed in our office with minimal to no relief, minimal relief of pain with surgery and has not been able to tolerate multiple medications and formal physical therapy treatments. We did discuss plan of care in detail with patient today including spinal cord stimulator procedure. I was able to clarify and answer questions regarding spinal cord stimulator process using educational brochure and spine model. Patient would like more time to consider spinal cord stimulator placement, she is instructed to let us know when she decides so that we can start process. Patient  instructed to remain active and to continue home exercise regimen as tolerated. No red flag symptoms noted upon exam today.    Meds & Orders: No orders of the defined types were placed in this encounter.  No orders of the defined types were placed in this  encounter.   Follow-up: Return if symptoms worsen or fail to improve.   Procedures: No procedures performed      Clinical History: EXAM: MRI LUMBAR SPINE WITHOUT AND WITH CONTRAST   TECHNIQUE: Multiplanar and multiecho pulse sequences of the lumbar spine were obtained without and with intravenous contrast.   CONTRAST:  15mL MULTIHANCE GADOBENATE DIMEGLUMINE 529 MG/ML IV SOLN   COMPARISON:  MRI of lumbar spine October 25, 2020.   FINDINGS: Segmentation:  Standard.   Alignment:  Trace retrolisthesis at L5-S1.   Vertebrae: No fracture, evidence of discitis, or bone lesion. Endplate degenerative changes at L3-4, L4-5 and L5-S1.   Conus medullaris and cauda equina: Conus extends to the L1-2 level. Conus and cauda equina appear normal.   Paraspinal and other soft tissues: Stable appearance of heterogeneous 2 cm right renal lesion.   Disc levels:   T12-L1: No spinal canal or neural foraminal stenosis.   L1-2 shallow disc bulge and mild facet degenerative changes without significant spinal canal or neural foraminal stenosis.   L2-3: Mild facet degenerative changes. No significant spinal canal or neural foraminal stenosis.   L3-4: Disc bulge with superimposed right subarticular/foraminal disc protrusion, mild to moderate facet degenerative change and ligamentum flavum redundancy. Findings result in moderate narrowing of the right subarticular zone, moderate right and mild left neural foraminal narrowing. No significant spinal canal stenosis. No significant change from prior.   L4-5: Postsurgical changes from left laminectomy and micro discectomy. Loss of disc height, progressed from prior MRI, with recurrent inferiorly migrating left central to foraminal disc extrusion, mild facet degenerative changes and ligamentum flavum redundancy. Findings result in mild spinal canal stenosis mild right and severe left subarticular zone stenosis with posterior displacement of the  traversing left L5 nerve root and moderate to severe bilateral neural foraminal narrowing.   L5-S1: Loss of disc high, disc bulge with superimposed tiny right central disc protrusion and mild facet degenerative changes resulting in mild narrowing of the bilateral subarticular zones, moderate to severe right and mild left neural foraminal narrowing.   IMPRESSION: 1. 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 Moderate to severe bilateral neural foraminal narrowing at this level. 2. Moderate to severe right and mild left neural foraminal narrowing at L5-S1. 3. Moderate right and mild left neural foraminal narrowing at L3-4.     Electronically Signed   By: Baldemar Lenis M.D.   On: 07/18/2021 09:29   She reports that she quit smoking about 40 years ago. Her smoking use included cigarettes. She has a 25.00 pack-year smoking history. She has never used smokeless tobacco.  Recent Labs    12/20/20 1034 06/21/21 1317 09/25/21 1044  HGBA1C 6.9* 7.7* 7.8*    Objective:  VS:  HT:    WT:   BMI:     BP:138/82  HR:82bpm  TEMP: ( )  RESP:95 % Physical Exam Vitals and nursing note reviewed.  HENT:     Head: Normocephalic and atraumatic.     Right Ear: External ear normal.     Left Ear: External ear normal.  Nose: Nose normal.     Mouth/Throat:     Mouth: Mucous membranes are moist.  Eyes:     Extraocular Movements: Extraocular movements intact.  Cardiovascular:     Rate and Rhythm: Normal rate.     Pulses: Normal pulses.  Pulmonary:     Effort: Pulmonary effort is normal.  Abdominal:     General: Abdomen is flat. There is no distension.  Musculoskeletal:        General: Tenderness present.     Cervical back: Normal range of motion.     Comments: Pt rises from seated position to standing without difficulty. Good lumbar range of  motion. Strong distal strength without clonus, no pain upon palpation of greater trochanters. Sensation intact bilaterally. Walks independently, gait steady.   Skin:    General: Skin is warm and dry.     Capillary Refill: Capillary refill takes less than 2 seconds.  Neurological:     General: No focal deficit present.     Mental Status: She is alert and oriented to person, place, and time.  Psychiatric:        Mood and Affect: Mood normal.        Behavior: Behavior normal.    Ortho Exam  Imaging: No results found.  Past Medical/Family/Surgical/Social History: Medications & Allergies reviewed per EMR, new medications updated. 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   Past Medical History:  Diagnosis Date   Arthritis    DM type 2 (diabetes mellitus, type 2) (HCC)    GERD (gastroesophageal reflux disease)    History of kidney stones    Hyperlipemia    Hypertension    Left leg pain    Wears hearing aid in both ears    Family History  Family history unknown: Yes   Past Surgical History:  Procedure Laterality Date   ABDOMINAL HYSTERECTOMY  1982   partial   APPENDECTOMY  1957   BACK SURGERY  07/12/2020   lower at [facility]   CYSTOSCOPY W/ URETERAL STENT PLACEMENT Right 10/06/2020   Procedure: CYSTOSCOPY WITH RETROGRADE PYELOGRAM/URETERAL STENT PLACEMENT;  Surgeon: Sebastian Ache, MD;   Location: [facility] OR;  Service: Urology;  Laterality: Right;   CYSTOSCOPY WITH RETROGRADE PYELOGRAM, URETEROSCOPY AND STENT PLACEMENT Right 10/26/2020   Procedure: CYSTOSCOPY WITH RETROGRADE PYELOGRAM, URETEROSCOPY AND STENT REPLACEMENT;  Surgeon: Sebastian Ache, MD;  Location: [facility];  Service: Urology;  Laterality: Right;  90 MINS   HOLMIUM LASER APPLICATION Right 10/26/2020   Procedure: HOLMIUM LASER APPLICATION;  Surgeon: Sebastian Ache, MD;  Location: [facility];  Service: Urology;  Laterality: Right;   JOINT REPLACEMENT  2016   both knees partial replacement   Social History   Occupational History   Not on file  Tobacco Use   Smoking status: Former    Packs/day: 1.00    Years: 25.00    Pack years: 25.00    Types: Cigarettes  Quit date: 09/08/1981    Years since quitting: 40.2   Smokeless tobacco: Never  Vaping Use   Vaping Use: Never used  Substance and Sexual Activity   Alcohol use: Never   Drug use: Never   Sexual activity: Not on file

## 2021-11-28 ENCOUNTER — Telehealth (INDEPENDENT_AMBULATORY_CARE_PROVIDER_SITE_OTHER): Payer: Medicare Other | Admitting: Family Medicine

## 2021-11-28 ENCOUNTER — Other Ambulatory Visit: Payer: Self-pay

## 2021-11-28 ENCOUNTER — Encounter: Payer: Self-pay | Admitting: Family Medicine

## 2021-11-28 DIAGNOSIS — R0981 Nasal congestion: Secondary | ICD-10-CM

## 2021-11-28 DIAGNOSIS — R051 Acute cough: Secondary | ICD-10-CM

## 2021-11-28 DIAGNOSIS — Z789 Other specified health status: Secondary | ICD-10-CM

## 2021-11-28 MED ORDER — BENZONATATE 100 MG PO CAPS: ORAL_CAPSULE | ORAL | 0 refills | Status: DC

## 2021-11-28 NOTE — Progress Notes (Signed)
Virtual Visit via Telephone Note ? ?I connected with Ann Thompson on 11/28/21 at 12:00 PM EDT by telephone and verified that I am speaking with the correct person using two identifiers. ?  ?I discussed the limitations of performing an evaluation and management service by telephone and requested permission for a phone visit. The patient expressed understanding and agreed to proceed. ? ?Location patient:  [place] ?Location provider: work or home office ?Participants present for the call: patient, provider ?Patient did not have a visit with me in the prior 7 days to address this/these issue(s). ? ? ?History of Present Illness: ? ?Acute telemedicine visit for a cough: ?-Onset: about 5-6 days ?-Symptoms include: runny nose, cough ?-was out with friends last week and they all had a cold and now she has it ?-Denies: fever, CP, SOB, body aches, flu like symptoms, malaise, thick or discolored mucus ?-Pertinent past medical history: see below ?-Pertinent medication allergies: No Known Allergies ?-COVID-19 vaccine status: ?Immunization History  ?Administered Date(s) Administered  ? Fluad Quad(high Dose 65+) 06/22/2020  ? Influenza-Unspecified 07/05/2021  ? PFIZER(Purple Top)SARS-COV-2 Vaccination 10/10/2019, 11/06/2019  ? ?She has another concern of statin side effect: ?-reports PCP prescribed statin, but it interferes with her sleep so she stopped it ?-she wasn't sure why she needed it as reports her cholesterol had always been ok in the past ?-reports it just made her stay awake ?  ?Past Medical History:  ?Diagnosis Date  ? Arthritis   ? DM type 2 (diabetes mellitus, type 2) (HCC)   ? GERD (gastroesophageal reflux disease)   ? History of kidney stones   ? Hyperlipemia   ? Hypertension   ? Left leg pain   ? Wears hearing aid in both ears   ? ? ?Current Outpatient Medications on File Prior to Visit  ?Medication Sig Dispense Refill  ? acetaminophen (TYLENOL) 500 MG tablet Take 1,000 mg by mouth every 6 (six) hours as needed.    ?  bisoprolol-hydrochlorothiazide (ZIAC) 2.5-6.25 MG tablet TAKE 1 TABLET BY MOUTH EVERY DAY 90 tablet 3  ? celecoxib (CELEBREX) 200 MG capsule celecoxib 200 mg capsule ? Take 1 capsule every day by oral route.    ? cycloSPORINE (RESTASIS) 0.05 % ophthalmic emulsion Restasis 0.05 % eye drops in a dropperette ? Instill 1 drop IN [facility] EYE EVERY TWELVE HOURS    ? diazepam (VALIUM) 5 MG tablet Take one tablet by mouth with food one hour prior to procedure. May repeat 30 minutes prior if needed. 2 tablet 0  ? fluticasone (FLONASE) 50 MCG/ACT nasal spray USE 2 SPRAYS IN EACH NOSTRIL EVERY DAY 16 g 5  ? glipiZIDE (GLUCOTROL XL) 2.5 MG 24 hr tablet Take 1 tablet (2.5 mg total) by mouth daily with breakfast. 90 tablet 0  ? Glucosamine-Chondroit-Vit C-Mn (GLUCOSAMINE 1500 COMPLEX) CAPS Take 1 capsule by mouth daily.    ? JANUVIA 50 MG tablet TAKE 1 TABLET BY MOUTH EVERY DAY 90 tablet 0  ? MAGNESIUM-POTASSIUM PO Take 1 tablet by mouth daily.    ? Multiple Vitamin (MULTIVITAMIN) tablet Take 1 tablet by mouth daily.    ? pramipexole (MIRAPEX) 1.5 MG tablet TAKE 1 TABLET BY MOUTH NIGHTLY AT BEDTIME 90 tablet 1  ? simvastatin (ZOCOR) 10 MG tablet Take 1 tablet (10 mg total) by mouth daily. 90 tablet 3  ? Tetrahydrozoline-Zn Sulfate (EYE DROPS A/C OP) Apply 1 drop to eye daily as needed (dry eyes).    ? triamterene-hydrochlorothiazide (MAXZIDE-25) 37.5-25 MG tablet TAKE 1 TABLET BY  MOUTH EVERY DAY 90 tablet 2  ? UNABLE TO FIND Med Name: cbd oil to knees prn    ? ?No current facility-administered medications on file prior to visit.  ? ? ?Observations/Objective: ?Patient sounds cheerful and well on the phone. ?I do not appreciate any SOB. ?Speech and thought processing are grossly intact. ?Patient reported vitals: ? ?Assessment and Plan: ? ?Acute cough ? ?Nasal congestion ? ?Statin intolerance ? ?-we discussed possible serious and likely etiologies, options for evaluation and workup, limitations of telemedicine visit vs in person visit,  treatment, treatment risks and precautions. Pt prefers to treat via telemedicine empirically rather than in person at this moment.  ?-for the upper resp symptoms suspect viral illness, possible mild covid vs other. She has opted to try Tessalon rx for cough. Other symptomatic care measures per patient instructions. ?-reviewed her lipid results with her, CV risks with co morbidities of elevated HTN and diabetes, statin risks/benefits/side effects and options. She was started on a low dose of simvastatin by PCP but didn't want to take due to sleeping issues. She has opted to try taking a few days a week and see if can increase over times with regular follow up with PCP advised. Advised if not able to tol to contact PCP or follow up.  ?Advised to seek prompt virtual visit or in person care if worsening, new symptoms arise, or if is not improving with treatment as expected per our conversation of expected course. Discussed options for follow up care. Did let this patient know that I do telemedicine on Tuesdays and Thursdays for  and those are the days I am logged into the system. Advised to schedule follow up visit with PCP, [place] virtual visits or [facility] if any further questions or concerns to avoid delays in care. ?  ?I discussed the assessment and treatment plan with the patient. The patient was provided an opportunity to ask questions and all were answered. The patient agreed with the plan and demonstrated an understanding of the instructions. ?  ? ?Follow Up Instructions: ? ?I did not refer this patient for an OV with me in the next 24 hours for this/these issue(s). ? ?I discussed the assessment and treatment plan with the patient. The patient was provided an opportunity to ask questions and all were answered. The patient agreed with the plan and demonstrated an understanding of the instructions. ? ? ?I spent 15 minutes on the date of this visit in the care of this patient. See summary of tasks completed  to properly care for this patient in the detailed notes above which also included counseling of above, review of PMH, medications, allergies, evaluation of the patient and ordering and/or  instructing patient on testing and care options.   ? ? ?Terressa Koyanagi, DO  ? ?

## 2021-11-28 NOTE — Patient Instructions (Signed)
?  HOME CARE TIPS: ? ?-can try taking the cholesterol medication (simvastatin) a few times per week and then gradually increase to see if you tolerate this better. Please schedule follow up with your primary care provider in 3-4 months if not already scheduled or contact him sooner if any concerns. ? ?For the "cold" symptoms: ? ?Perhaps consider covid testing as have seen some cases this week.  ?-COVID19 testing information: ?[email or web address] ? ?Most pharmacies also offer testing and home test kits.  ? ?-I sent the medication(s) we discussed to your pharmacy: ?Meds ordered this encounter  ?Medications  ? benzonatate (TESSALON PERLES) 100 MG capsule  ?  Sig: 1-2 capsules up to twice daily as needed for cough.  ?  Dispense:  30 capsule  ?  Refill:  0  ?  ? ?-nasal saline sinus rinses twice daily if needed for nasal congestion ? ?-stay hydrated, drink plenty of fluids and eat small healthy meals - avoid dairy ? ?-follow up with your doctor in 2-3 days unless improving and feeling better ? ?It was nice to meet you today, and I really hope you are feeling better soon. I help [facility] out with telemedicine visits on Tuesdays and Thursdays and am happy to help if you need a follow up virtual visit on those days. Otherwise, if you have any concerns or questions following this visit please schedule a follow up visit with your Primary Care doctor or seek care at a local urgent care clinic to avoid delays in care.  ? ? ?Seek in person care or schedule a follow up video visit promptly if your symptoms worsen, new concerns arise or you are not improving with treatment. Call 911 and/or seek emergency care if your symptoms are severe or life threatening. ? ? ?

## 2021-12-23 ENCOUNTER — Other Ambulatory Visit (INDEPENDENT_AMBULATORY_CARE_PROVIDER_SITE_OTHER): Payer: Medicare Other

## 2021-12-23 DIAGNOSIS — E118 Type 2 diabetes mellitus with unspecified complications: Secondary | ICD-10-CM | POA: Diagnosis not present

## 2021-12-23 LAB — HEMOGLOBIN A1C: Hgb A1c MFr Bld: 6.8 % — ABNORMAL HIGH (ref 4.6–6.5)

## 2021-12-31 ENCOUNTER — Encounter: Payer: Self-pay | Admitting: Adult Health

## 2021-12-31 ENCOUNTER — Ambulatory Visit (INDEPENDENT_AMBULATORY_CARE_PROVIDER_SITE_OTHER): Payer: Medicare Other | Admitting: Adult Health

## 2021-12-31 VITALS — BP 140/80 | HR 76 | Temp 98.6°F | Ht 60.0 in | Wt 175.0 lb

## 2021-12-31 DIAGNOSIS — E118 Type 2 diabetes mellitus with unspecified complications: Secondary | ICD-10-CM

## 2021-12-31 DIAGNOSIS — I10 Essential (primary) hypertension: Secondary | ICD-10-CM

## 2021-12-31 MED ORDER — GLIPIZIDE ER 2.5 MG PO TB24: 2.5000 mg | ORAL_TABLET | Freq: Every day | ORAL | 0 refills | Status: DC

## 2021-12-31 NOTE — Progress Notes (Signed)
? ?Subjective:  ? ? Patient ID: Ann Thompson, female    DOB: 09-Apr-1941, 81 y.o.   MRN: [id number] ? ?HPI ? ?81 year old female who  has a past medical history of Arthritis, DM type 2 (diabetes mellitus, type 2) (HCC), GERD (gastroesophageal reflux disease), History of kidney stones, Hyperlipemia, Hypertension, Left leg pain, and Wears hearing aid in both ears. ? ?She presents to the office today for 36-month follow-up regarding diabetes and hypertension. ? ?Hypertension-managed with Ziac 2.5-6.25 mg and Maxide 37.5-25 mg daily.  She denies dizziness, lightheadedness, chest pain, or shortness of breath ? ?BP Readings from Last 3 Encounters:  ?12/31/21 140/80  ?11/26/21 138/82  ?09/25/21 140/60  ? ? ?Diabetes mellitus-takes Januvia 50 mg daily and glipizide 2.5 mg extended release daily.  She does not monitor her blood sugars at home but denies episodes of hypoglycemia.  Her last A1c was 7.8.  At this time we added glipizide 2.5 mg extended release.  She recently had her A1c done a week ago which showed a reading of 6.8. ? ? ? ?Review of Systems ?See HPI  ? ?Past Medical History:  ?Diagnosis Date  ? Arthritis   ? DM type 2 (diabetes mellitus, type 2) (HCC)   ? GERD (gastroesophageal reflux disease)   ? History of kidney stones   ? Hyperlipemia   ? Hypertension   ? Left leg pain   ? Wears hearing aid in both ears   ? ? ?Social History  ? ?Socioeconomic History  ? Marital status: Married  ?  Spouse name: Not on file  ? Number of children: Not on file  ? Years of education: Not on file  ? Highest education level: Not on file  ?Occupational History  ? Not on file  ?Tobacco Use  ? Smoking status: Former  ?  Packs/day: 1.00  ?  Years: 25.00  ?  Pack years: 25.00  ?  Types: Cigarettes  ?  Quit date: 09/08/1981  ?  Years since quitting: 40.3  ? Smokeless tobacco: Never  ?Vaping Use  ? Vaping Use: Never used  ?Substance and Sexual Activity  ? Alcohol use: Never  ? Drug use: Never  ? Sexual activity: Not on file  ?Other Topics  Concern  ? Not on file  ?Social History Narrative  ? Not on file  ? ?Social Determinants of Health  ? ?Financial Resource Strain: Low Risk   ? Difficulty of Paying Living Expenses: Not hard at all  ?Food Insecurity: No Food Insecurity  ? Worried About Programme researcher, broadcasting/film/video in the Last Year: Never true  ? Ran Out of Food in the Last Year: Never true  ?Transportation Needs: No Transportation Needs  ? Lack of Transportation (Medical): No  ? Lack of Transportation (Non-Medical): No  ?Physical Activity: Inactive  ? Days of Exercise per Week: 0 days  ? Minutes of Exercise per Session: 0 min  ?Stress: No Stress Concern Present  ? Feeling of Stress : Not at all  ?Social Connections: Moderately Isolated  ? Frequency of Communication with Friends and Family: Twice a week  ? Frequency of Social Gatherings with Friends and Family: Twice a week  ? Attends Religious Services: Never  ? Active Member of Clubs or Organizations: No  ? Attends Banker Meetings: Never  ? Marital Status: Married  ?Intimate Partner Violence: Not At Risk  ? Fear of Current or Ex-Partner: No  ? Emotionally Abused: No  ? Physically Abused: No  ? Sexually  Abused: No  ? ? ?Past Surgical History:  ?Procedure Laterality Date  ? ABDOMINAL HYSTERECTOMY  1982  ? partial  ? APPENDECTOMY  1957  ? BACK SURGERY  07/12/2020  ? lower [facility]  ? CYSTOSCOPY W/ URETERAL STENT PLACEMENT Right 10/06/2020  ? Procedure: CYSTOSCOPY WITH RETROGRADE PYELOGRAM/URETERAL STENT PLACEMENT;  Surgeon: Sebastian AcheManny, Theodore, MD;  [facility];  Service: Urology;  Laterality: Right;  ? CYSTOSCOPY WITH RETROGRADE PYELOGRAM, URETEROSCOPY AND STENT PLACEMENT Right 10/26/2020  ? Procedure: CYSTOSCOPY WITH RETROGRADE PYELOGRAM, URETEROSCOPY AND STENT REPLACEMENT;  Surgeon: Sebastian AcheManny, Theodore, MD;  [facility];  Service: Urology;  Laterality: Right;  90 MINS  ? HOLMIUM LASER APPLICATION Right 10/26/2020  ? Procedure: HOLMIUM LASER APPLICATION;   Surgeon: Sebastian AcheManny, Theodore, MD;  [facility];  Service: Urology;  Laterality: Right;  ? JOINT REPLACEMENT  2016  ? both knees partial replacement  ? ? ?Family History  ?Family history unknown: Yes  ? ? ?No Known Allergies ? ?Current Outpatient Medications on File Prior to Visit  ?Medication Sig Dispense Refill  ? acetaminophen (TYLENOL) 325 MG tablet     ? bisoprolol-hydrochlorothiazide (ZIAC) 2.5-6.25 MG tablet TAKE 1 TABLET BY MOUTH EVERY DAY 90 tablet 3  ? celecoxib (CELEBREX) 200 MG capsule celecoxib 200 mg capsule ? Take 1 capsule every day by oral route.    ? cycloSPORINE (RESTASIS) 0.05 % ophthalmic emulsion     ? fluticasone (FLONASE) 50 MCG/ACT nasal spray USE 2 SPRAYS IN EACH NOSTRIL EVERY DAY 16 g 5  ? glipiZIDE (GLUCOTROL XL) 2.5 MG 24 hr tablet Take 1 tablet (2.5 mg total) by mouth daily with breakfast. 90 tablet 0  ? Glucosamine-Chondroit-Vit C-Mn (GLUCOSAMINE 1500 COMPLEX) CAPS Take 1 capsule by mouth daily.    ? JANUVIA 50 MG tablet TAKE 1 TABLET BY MOUTH EVERY DAY 90 tablet 0  ? Mag Aspart-Potassium Aspart 250-250 MG CAPS     ? Multiple Vitamin (MULTIVITAMIN) tablet Take 1 tablet by mouth daily.    ? Polyvinyl Alcohol-Povidone PF (REFRESH) 1.4-0.6 % SOLN     ? Potassium 75 MG TABS potassium    ? pramipexole (MIRAPEX) 1.5 MG tablet TAKE 1 TABLET BY MOUTH NIGHTLY AT BEDTIME 90 tablet 1  ? simvastatin (ZOCOR) 10 MG tablet Take 1 tablet (10 mg total) by mouth daily. 90 tablet 3  ? Tetrahydrozoline-Zn Sulfate (EYE DROPS A/C OP) Apply 1 drop to eye daily as needed (dry eyes).    ? triamcinolone cream (KENALOG) 0.1 % 1 application    ? triamterene-hydrochlorothiazide (MAXZIDE-25) 37.5-25 MG tablet TAKE 1 TABLET BY MOUTH EVERY DAY 90 tablet 2  ? UNABLE TO FIND Med Name: cbd oil to knees prn    ? vitamin B-12 (CYANOCOBALAMIN) 100 MCG tablet Vitamin B12    ? ?No current facility-administered medications on file prior to visit.  ? ? ?BP (!) 180/90   Pulse 76   Temp 98.6 ?F (37 ?C)  (Oral)   Ht 5' (1.524 m)   Wt 175 lb (79.4 kg)   SpO2 97%   BMI 34.18 kg/m?  ? ? ?   ?Objective:  ? Physical Exam ?Vitals and nursing note reviewed.  ?Constitutional:   ?   Appearance: Normal appearance.  ?Cardiovascular:  ?   Rate and Rhythm: Normal rate and regular rhythm.  ?   Pulses: Normal pulses.  ?   Heart sounds: Normal heart sounds.  ?Pulmonary:  ?   Effort: Pulmonary effort is normal.  ?  Breath sounds: Normal breath sounds.  ?Skin: ?   General: Skin is warm and dry.  ?Neurological:  ?   General: No focal deficit present.  ?   Mental Status: She is alert and oriented to person, place, and time.  ?Psychiatric:     ?   Mood and Affect: Mood normal.     ?   Behavior: Behavior normal.     ?   Thought Content: Thought content normal.     ?   Judgment: Judgment normal.  ? ?   ?Assessment & Plan:  ?1. Controlled type 2 diabetes mellitus with complication, without long-term current use of insulin (HCC) ?-At goal.  No change in medication.  Continue with Januvia and glipizide ?- glipiZIDE (GLUCOTROL XL) 2.5 MG 24 hr tablet; Take 1 tablet (2.5 mg total) by mouth daily with breakfast.  Dispense: 90 tablet; Refill: 0 ? ?2. Primary hypertension ?Well-controlled.  No change in medication. ? ?[name], NP ? ?

## 2021-12-31 NOTE — Patient Instructions (Signed)
Your A1c was 6.9! This is right where I want you to be ? ?Lets follow up with you in 3 months for diabetic follow up.  ? ? ?

## 2022-01-06 ENCOUNTER — Encounter: Payer: Self-pay | Admitting: Adult Health

## 2022-01-07 NOTE — Telephone Encounter (Signed)
Please advise 

## 2022-01-17 ENCOUNTER — Encounter: Payer: Self-pay | Admitting: Adult Health

## 2022-01-17 ENCOUNTER — Telehealth (INDEPENDENT_AMBULATORY_CARE_PROVIDER_SITE_OTHER): Payer: Medicare Other | Admitting: Adult Health

## 2022-01-17 DIAGNOSIS — J40 Bronchitis, not specified as acute or chronic: Secondary | ICD-10-CM | POA: Diagnosis not present

## 2022-01-17 MED ORDER — PREDNISONE 10 MG PO TABS: ORAL_TABLET | ORAL | 0 refills | Status: DC

## 2022-01-17 MED ORDER — BENZONATATE 200 MG PO CAPS: 200.0000 mg | ORAL_CAPSULE | Freq: Two times a day (BID) | ORAL | 0 refills | Status: DC | PRN

## 2022-01-17 NOTE — Progress Notes (Signed)
Virtual Visit via Video Note ? ?I connected with Ann Thompson on 01/17/22 at  4:15 PM EDT by a video enabled telemedicine application and verified that I am speaking with the correct person using two identifiers. ? Location patient: home ?Location provider:work or home office ?Persons participating in the virtual visit: patient, provider ? ?I discussed the limitations of evaluation and management by telemedicine and the availability of in person appointments. The patient expressed understanding and agreed to proceed. ? ? ?HPI: ?81 year old female who  has a past medical history of Arthritis, DM type 2 (diabetes mellitus, type 2) (HCC), GERD (gastroesophageal reflux disease), History of kidney stones, Hyperlipemia, Hypertension, Left leg pain, and Wears hearing aid in both ears. ? ?She is being evaluated today for an acute issue if cough x 1 weeks. Her cough is constant but dry. She also reports fatigue, chest congestion and puffiness around her eyes. She denies SOB, sinus pain or pressure, fevers or chills. She has tried OTC cough medication without relief.  ? ?Her husband has the same symptoms.  ? ? ?ROS: See pertinent positives and negatives per HPI. ? ?Past Medical History:  ?Diagnosis Date  ? Arthritis   ? DM type 2 (diabetes mellitus, type 2) (HCC)   ? GERD (gastroesophageal reflux disease)   ? History of kidney stones   ? Hyperlipemia   ? Hypertension   ? Left leg pain   ? Wears hearing aid in both ears   ? ? ?Past Surgical History:  ?Procedure Laterality Date  ? ABDOMINAL HYSTERECTOMY  1982  ? partial  ? APPENDECTOMY  1957  ? BACK SURGERY  07/12/2020  ? [facility]  ? CYSTOSCOPY W/ URETERAL STENT PLACEMENT Right 10/06/2020  ? Procedure: CYSTOSCOPY WITH RETROGRADE PYELOGRAM/URETERAL STENT PLACEMENT;  Surgeon: Sebastian Ache, MD;  Location: [facility] OR;  Service: Urology;  Laterality: Right;  ? CYSTOSCOPY WITH RETROGRADE PYELOGRAM, URETEROSCOPY AND STENT PLACEMENT Right 10/26/2020  ? Procedure:  CYSTOSCOPY WITH RETROGRADE PYELOGRAM, URETEROSCOPY AND STENT REPLACEMENT;  Surgeon: Sebastian Ache, MD;  [facility];  Service: Urology;  Laterality: Right;  90 MINS  ? HOLMIUM LASER APPLICATION Right 10/26/2020  ? Procedure: HOLMIUM LASER APPLICATION;  Surgeon: Sebastian Ache, MD;  Location: [facility];  Service: Urology;  Laterality: Right;  ? JOINT REPLACEMENT  2016  ? both knees partial replacement  ? ? ?Family History  ?Family history unknown: Yes  ? ? ? ? ? ?Current Outpatient Medications:  ?  acetaminophen (TYLENOL) 325 MG tablet, , Disp: , Rfl:  ?  bisoprolol-hydrochlorothiazide (ZIAC) 2.5-6.25 MG tablet, TAKE 1 TABLET BY MOUTH EVERY DAY, Disp: 90 tablet, Rfl: 3 ?  celecoxib (CELEBREX) 200 MG capsule, celecoxib 200 mg capsule  Take 1 capsule every day by oral route., Disp: , Rfl:  ?  cycloSPORINE (RESTASIS) 0.05 % ophthalmic emulsion, , Disp: , Rfl:  ?  fluticasone (FLONASE) 50 MCG/ACT nasal spray, USE 2 SPRAYS IN EACH NOSTRIL EVERY DAY, Disp: 16 g, Rfl: 5 ?  glipiZIDE (GLUCOTROL XL) 2.5 MG 24 hr tablet, Take 1 tablet (2.5 mg total) by mouth daily with breakfast., Disp: 90 tablet, Rfl: 0 ?  Glucosamine-Chondroit-Vit C-Mn (GLUCOSAMINE 1500 COMPLEX) CAPS, Take 1 capsule by mouth daily., Disp: , Rfl:  ?  JANUVIA 50 MG tablet, TAKE 1 TABLET BY MOUTH EVERY DAY, Disp: 90 tablet, Rfl: 0 ?  Mag Aspart-Potassium Aspart 250-250 MG CAPS, , Disp: , Rfl:  ?  Multiple Vitamin (MULTIVITAMIN) tablet, Take 1  tablet by mouth daily., Disp: , Rfl:  ?  Polyvinyl Alcohol-Povidone PF (REFRESH) 1.4-0.6 % SOLN, , Disp: , Rfl:  ?  Potassium 75 MG TABS, potassium, Disp: , Rfl:  ?  pramipexole (MIRAPEX) 1.5 MG tablet, TAKE 1 TABLET BY MOUTH NIGHTLY AT BEDTIME, Disp: 90 tablet, Rfl: 1 ?  simvastatin (ZOCOR) 10 MG tablet, Take 1 tablet (10 mg total) by mouth daily., Disp: 90 tablet, Rfl: 3 ?  Tetrahydrozoline-Zn Sulfate (EYE DROPS A/C OP), Apply 1 drop to eye daily as needed (dry eyes)., Disp: ,  Rfl:  ?  triamcinolone cream (KENALOG) 0.1 %, 1 application, Disp: , Rfl:  ?  triamterene-hydrochlorothiazide (MAXZIDE-25) 37.5-25 MG tablet, TAKE 1 TABLET BY MOUTH EVERY DAY, Disp: 90 tablet, Rfl: 2 ?  UNABLE TO FIND, Med Name: cbd oil to knees prn, Disp: , Rfl:  ?  vitamin B-12 (CYANOCOBALAMIN) 100 MCG tablet, Vitamin B12, Disp: , Rfl:  ? ?EXAM: ? ?VITALS per patient if applicable: ? ?GENERAL: alert, oriented, appears well and in no acute distress ? ?HEENT: atraumatic, conjunttiva clear, no obvious abnormalities on inspection of external nose and ears ? ?NECK: normal movements of the head and neck ? ?LUNGS: on inspection no signs of respiratory distress, breathing rate appears normal, no obvious gross SOB, gasping or wheezing ? ?CV: no obvious cyanosis ? ?MS: moves all visible extremities without noticeable abnormality ? ?PSYCH/NEURO: pleasant and cooperative, no obvious depression or anxiety, speech and thought processing grossly intact ? ?ASSESSMENT AND PLAN: ? ?Discussed the following assessment and plan: ? ?1. Bronchitis ?- Likely seasonal allergy mediated.  ?- Advised drinking more water when taking steroids to keep blood sugars low ?- Follow up if no improvement in the next week or sooner if symptoms worsen  ?- predniSONE (DELTASONE) 10 MG tablet; 40 mg x 3 days, 20 mg x 3 days, 10 mg x 3 days  Dispense: 21 tablet; Refill: 0 ?- benzonatate (TESSALON) 200 MG capsule; Take 1 capsule (200 mg total) by mouth 2 (two) times daily as needed for cough.  Dispense: 20 capsule; Refill: 0 ? ? ? ?  ?I discussed the assessment and treatment plan with the patient. The patient was provided an opportunity to ask questions and all were answered. The patient agreed with the plan and demonstrated an understanding of the instructions. ?  ?The patient was advised to call back or seek an in-person evaluation if the symptoms worsen or if the condition fails to improve as anticipated. ? ? ?[name], NP  ? ?

## 2022-02-04 ENCOUNTER — Other Ambulatory Visit: Payer: Self-pay | Admitting: Adult Health

## 2022-02-04 ENCOUNTER — Ambulatory Visit (INDEPENDENT_AMBULATORY_CARE_PROVIDER_SITE_OTHER): Payer: Medicare Other

## 2022-02-04 VITALS — BP 110/70 | HR 79 | Temp 97.8°F | Ht 60.0 in | Wt 173.9 lb

## 2022-02-04 DIAGNOSIS — Z0000 Encounter for general adult medical examination without abnormal findings: Secondary | ICD-10-CM

## 2022-02-04 MED ORDER — CELECOXIB 200 MG PO CAPS: ORAL_CAPSULE | ORAL | 1 refills | Status: DC

## 2022-02-04 MED ORDER — CELECOXIB 200 MG PO CAPS: 200.0000 mg | ORAL_CAPSULE | Freq: Every day | ORAL | 1 refills | Status: DC

## 2022-02-04 NOTE — Patient Instructions (Signed)
Ann Thompson , Thank you for taking time to come for your Medicare Wellness Visit. I appreciate your ongoing commitment to your health goals. Please review the following plan we discussed and let me know if I can assist you in the future.   Screening recommendations/referrals: Colonoscopy: not required Mammogram: not required Bone Density: decline Recommended yearly ophthalmology/optometry visit for glaucoma screening and checkup Recommended yearly dental visit for hygiene and checkup  Vaccinations: Influenza vaccine: due 04/08/2022 Pneumococcal vaccine: states had Tdap vaccine: due Shingles vaccine: decline   Covid-19: 11/06/2019, 10/10/2019  Advanced directives: Please bring a copy of your POA (Power of Attorney) and/or Living Will to your next appointment.   Conditions/risks identified: none  Next appointment: Follow up in one year for your annual wellness visit    Preventive Care 65 Years and Older, Female Preventive care refers to lifestyle choices and visits with your health care provider that can promote health and wellness. What does preventive care include? A yearly physical exam. This is also called an annual well check. Dental exams once or twice a year. Routine eye exams. Ask your health care provider how often you should have your eyes checked. Personal lifestyle choices, including: Daily care of your teeth and gums. Regular physical activity. Eating a healthy diet. Avoiding tobacco and drug use. Limiting alcohol use. Practicing safe sex. Taking low-dose aspirin every day. Taking vitamin and mineral supplements as recommended by your health care provider. What happens during an annual well check? The services and screenings done by your health care provider during your annual well check will depend on your age, overall health, lifestyle risk factors, and family history of disease. Counseling  Your health care provider may ask you questions about your: Alcohol  use. Tobacco use. Drug use. Emotional well-being. Home and relationship well-being. Sexual activity. Eating habits. History of falls. Memory and ability to understand (cognition). Work and work Astronomer. Reproductive health. Screening  You may have the following tests or measurements: Height, weight, and BMI. Blood pressure. Lipid and cholesterol levels. These may be checked every 5 years, or more frequently if you are over 44 years old. Skin check. Lung cancer screening. You may have this screening every year starting at age 65 if you have a 30-pack-year history of smoking and currently smoke or have quit within the past 15 years. Fecal occult blood test (FOBT) of the stool. You may have this test every year starting at age 68. Flexible sigmoidoscopy or colonoscopy. You may have a sigmoidoscopy every 5 years or a colonoscopy every 10 years starting at age 4. Hepatitis C blood test. Hepatitis B blood test. Sexually transmitted disease (STD) testing. Diabetes screening. This is done by checking your blood sugar (glucose) after you have not eaten for a while (fasting). You may have this done every 1-3 years. Bone density scan. This is done to screen for osteoporosis. You may have this done starting at age 35. Mammogram. This may be done every 1-2 years. Talk to your health care provider about how often you should have regular mammograms. Talk with your health care provider about your test results, treatment options, and if necessary, the need for more tests. Vaccines  Your health care provider may recommend certain vaccines, such as: Influenza vaccine. This is recommended every year. Tetanus, diphtheria, and acellular pertussis (Tdap, Td) vaccine. You may need a Td booster every 10 years. Zoster vaccine. You may need this after age 20. Pneumococcal 13-valent conjugate (PCV13) vaccine. One dose is recommended after age 3.  Pneumococcal polysaccharide (PPSV23) vaccine. One dose is  recommended after age 74. Talk to your health care provider about which screenings and vaccines you need and how often you need them. This information is not intended to replace advice given to you by your health care provider. Make sure you discuss any questions you have with your health care provider. Document Released: 09/21/2015 Document Revised: 05/14/2016 Document Reviewed: 06/26/2015 Elsevier Interactive Patient Education  2017 [place] Prevention in the Home Falls can cause injuries. They can happen to people of all ages. There are many things you can do to make your home safe and to help prevent falls. What can I do on the outside of my home? Regularly fix the edges of walkways and driveways and fix any cracks. Remove anything that might make you trip as you walk through a door, such as a raised step or threshold. Trim any bushes or trees on the path to your home. Use bright outdoor lighting. Clear any walking paths of anything that might make someone trip, such as rocks or tools. Regularly check to see if handrails are loose or broken. Make sure that both sides of any steps have handrails. Any raised decks and porches should have guardrails on the edges. Have any leaves, snow, or ice cleared regularly. Use sand or salt on walking paths during winter. Clean up any spills in your garage right away. This includes oil or grease spills. What can I do in the bathroom? Use night lights. Install grab bars by the toilet and in the tub and shower. Do not use towel bars as grab bars. Use non-skid mats or decals in the tub or shower. If you need to sit down in the shower, use a plastic, non-slip stool. Keep the floor dry. Clean up any water that spills on the floor as soon as it happens. Remove soap buildup in the tub or shower regularly. Attach bath mats securely with double-sided non-slip rug tape. Do not have throw rugs and other things on the floor that can make you  trip. What can I do in the bedroom? Use night lights. Make sure that you have a light by your bed that is easy to reach. Do not use any sheets or blankets that are too big for your bed. They should not hang down onto the floor. Have a firm chair that has side arms. You can use this for support while you get dressed. Do not have throw rugs and other things on the floor that can make you trip. What can I do in the kitchen? Clean up any spills right away. Avoid walking on wet floors. Keep items that you use a lot in easy-to-reach places. If you need to reach something above you, use a strong step stool that has a grab bar. Keep electrical cords out of the way. Do not use floor polish or wax that makes floors slippery. If you must use wax, use non-skid floor wax. Do not have throw rugs and other things on the floor that can make you trip. What can I do with my stairs? Do not leave any items on the stairs. Make sure that there are handrails on both sides of the stairs and use them. Fix handrails that are broken or loose. Make sure that handrails are as long as the stairways. Check any carpeting to make sure that it is firmly attached to the stairs. Fix any carpet that is loose or worn. Avoid having throw rugs at the  top or bottom of the stairs. If you do have throw rugs, attach them to the floor with carpet tape. Make sure that you have a light switch at the top of the stairs and the bottom of the stairs. If you do not have them, ask someone to add them for you. What else can I do to help prevent falls? Wear shoes that: Do not have high heels. Have rubber bottoms. Are comfortable and fit you well. Are closed at the toe. Do not wear sandals. If you use a stepladder: Make sure that it is fully opened. Do not climb a closed stepladder. Make sure that both sides of the stepladder are locked into place. Ask someone to hold it for you, if possible. Clearly mark and make sure that you can  see: Any grab bars or handrails. First and last steps. Where the edge of each step is. Use tools that help you move around (mobility aids) if they are needed. These include: Canes. Walkers. Scooters. Crutches. Turn on the lights when you go into a dark area. Replace any light bulbs as soon as they burn out. Set up your furniture so you have a clear path. Avoid moving your furniture around. If any of your floors are uneven, fix them. If there are any pets around you, be aware of where they are. Review your medicines with your doctor. Some medicines can make you feel dizzy. This can increase your chance of falling. Ask your doctor what other things that you can do to help prevent falls. This information is not intended to replace advice given to you by your health care provider. Make sure you discuss any questions you have with your health care provider. Document Released: 06/21/2009 Document Revised: 01/31/2016 Document Reviewed: 09/29/2014 Elsevier Interactive Patient Education  2017 [employer].

## 2022-02-04 NOTE — Progress Notes (Signed)
Subjective:   Ann Thompson is a 81 y.o. female who presents for Medicare Annual (Subsequent) preventive examination.  Review of Systems     Cardiac Risk Factors include: advanced age (>15men, >48 women);diabetes mellitus;hypertension;obesity (BMI >30kg/m2)     Objective:    Today's Vitals   02/04/22 1134 02/04/22 1144  BP: 110/70   Pulse: 79   Temp: 97.8 F (36.6 C)   TempSrc: Oral   SpO2: 95%   Weight: 173 lb 14.4 oz (78.9 kg)   Height: 5' (1.524 m)   PainSc:  5    Body mass index is 33.96 kg/m.     02/04/2022   11:53 AM 02/27/2021    3:19 PM 01/30/2021    2:06 PM 10/26/2020    9:52 AM 10/08/2020    8:32 AM  Advanced Directives  Does Patient Have a Medical Advance Directive? Yes Yes Yes Yes Yes  Type of Estate agent of [place];Living will Healthcare Power of [place];Living will Healthcare Power of [place];Living will  Healthcare Power of [place];Living will  Does patient want to make changes to medical advance directive?   No - Patient declined No - Patient declined No - Patient declined  Copy of Healthcare Power of Attorney in Chart? No - copy requested No - copy requested No - copy requested  No - copy requested    Current Medications (verified) Outpatient Encounter Medications as of 02/04/2022  Medication Sig   acetaminophen (TYLENOL) 325 MG tablet    bisoprolol-hydrochlorothiazide (ZIAC) 2.5-6.25 MG tablet TAKE 1 TABLET BY MOUTH EVERY DAY   celecoxib (CELEBREX) 200 MG capsule celecoxib 200 mg capsule  Take 1 capsule every day by oral route.   cycloSPORINE (RESTASIS) 0.05 % ophthalmic emulsion    fluticasone (FLONASE) 50 MCG/ACT nasal spray USE 2 SPRAYS IN EACH NOSTRIL EVERY DAY   glipiZIDE (GLUCOTROL XL) 2.5 MG 24 hr tablet Take 1 tablet (2.5 mg total) by mouth daily with breakfast.   Glucosamine-Chondroit-Vit C-Mn (GLUCOSAMINE 1500 COMPLEX) CAPS Take 1 capsule by mouth daily.   JANUVIA 50 MG tablet TAKE 1 TABLET BY MOUTH EVERY DAY    Multiple Vitamins-Minerals (PRESERVISION AREDS PO) Take 1 tablet by mouth daily.   Polyvinyl Alcohol-Povidone PF (REFRESH) 1.4-0.6 % SOLN    Potassium 75 MG TABS potassium   pramipexole (MIRAPEX) 1.5 MG tablet TAKE 1 TABLET BY MOUTH NIGHTLY AT BEDTIME   Tetrahydrozoline-Zn Sulfate (EYE DROPS A/C OP) Apply 1 drop to eye daily as needed (dry eyes).   triamcinolone cream (KENALOG) 0.1 % 1 application   triamterene-hydrochlorothiazide (MAXZIDE-25) 37.5-25 MG tablet TAKE 1 TABLET BY MOUTH EVERY DAY   UNABLE TO FIND Med Name: cbd oil to knees prn   vitamin B-12 (CYANOCOBALAMIN) 100 MCG tablet Vitamin B12   benzonatate (TESSALON) 200 MG capsule Take 1 capsule (200 mg total) by mouth 2 (two) times daily as needed for cough. (Patient not taking: Reported on 02/04/2022)   Mag Aspart-Potassium Aspart 250-250 MG CAPS  (Patient not taking: Reported on 02/04/2022)   Multiple Vitamin (MULTIVITAMIN) tablet Take 1 tablet by mouth daily. (Patient not taking: Reported on 02/04/2022)   predniSONE (DELTASONE) 10 MG tablet 40 mg x 3 days, 20 mg x 3 days, 10 mg x 3 days (Patient not taking: Reported on 02/04/2022)   simvastatin (ZOCOR) 10 MG tablet Take 1 tablet (10 mg total) by mouth daily. (Patient not taking: Reported on 02/04/2022)   No facility-administered encounter medications on file as of 02/04/2022.    Allergies (verified) Patient  has no known allergies.   History: Past Medical History:  Diagnosis Date   Arthritis    DM type 2 (diabetes mellitus, type 2) (HCC)    GERD (gastroesophageal reflux disease)    History of kidney stones    Hyperlipemia    Hypertension    Left leg pain    Wears hearing aid in both ears    Past Surgical History:  Procedure Laterality Date   ABDOMINAL HYSTERECTOMY  1982   partial   [place] SURGERY  07/12/2020   lower at [facility]   [place] Right 10/06/2020   Procedure: CYSTOSCOPY WITH RETROGRADE PYELOGRAM/URETERAL  STENT PLACEMENT;  Surgeon: Alexis Frock, MD;  Location: [place];  Service: Urology;  Laterality: Right;   CYSTOSCOPY WITH RETROGRADE PYELOGRAM, URETEROSCOPY AND STENT PLACEMENT Right 10/26/2020   Procedure: CYSTOSCOPY WITH RETROGRADE PYELOGRAM, URETEROSCOPY AND STENT REPLACEMENT;  Surgeon: Alexis Frock, MD;  Location: [facility];  Service: Urology;  Laterality: Right;  90 MINS   HOLMIUM LASER APPLICATION Right [id number]   Procedure: HOLMIUM LASER APPLICATION;  Surgeon: Alexis Frock, MD;  Location: [facility];  Service: Urology;  Laterality: Right;   JOINT REPLACEMENT  2016   both knees partial replacement   Family History  Family history unknown: Yes   Social History   Socioeconomic History   Marital status: Married    Spouse name: Not on file   Number of children: Not on file   Years of education: Not on file   Highest education level: Not on file  Occupational History   Not on file  Tobacco Use   Smoking status: Former    Packs/day: 1.00    Years: 25.00    Pack years: 25.00    Types: Cigarettes    Quit date: 09/08/1981    Years since quitting: 40.4   Smokeless tobacco: Never  Vaping Use   Vaping Use: Never used  Substance and Sexual Activity   Alcohol use: Never   Drug use: Never   Sexual activity: Not on file  Other Topics Concern   Not on file  Social History Narrative   Not on file   Social Determinants of Health   Financial Resource Strain: Low Risk    Difficulty of Paying Living Expenses: Not hard at all  Food Insecurity: No Food Insecurity   Worried About Charity fundraiser in the Last Year: Never true   [place] in the Last Year: Never true  Transportation Needs: No Transportation Needs   Lack of Transportation (Medical): No   Lack of Transportation (Non-Medical): No  Physical Activity: Inactive   Days of Exercise per Week: 0 days   Minutes of Exercise per Session: 0 min  Stress: No Stress Concern Present    Feeling of Stress : Not at all  Social Connections: Not on file    Tobacco Counseling Counseling given: Not Answered   Clinical Intake:  Pre-visit preparation completed: Yes  Pain : 0-10 Pain Score: 5  Pain Type: Chronic pain Pain Location: Hip Pain Orientation: Right, Left Pain Radiating Towards: down left leg Pain Descriptors / Indicators: Aching Pain Onset: More than a month ago Pain Frequency: Constant     Nutritional Status: BMI > 30  Obese Nutritional Risks: None Diabetes: Yes  How often do you need to have someone help you when you read instructions, pamphlets, or other written materials from your doctor or pharmacy?: 1 -  Never What is the last grade level you completed in school?: 12th grade  Diabetic? Yes Nutrition Risk Assessment:  Has the patient had any N/V/D within the last 2 months?  No  Does the patient have any non-healing wounds?  No  Has the patient had any unintentional weight loss or weight gain?  Yes   Diabetes:  Is the patient diabetic?  Yes  If diabetic, was a CBG obtained today?  No  Did the patient bring in their glucometer from home?  No  How often do you monitor your CBG's? Does not check.   Financial Strains and Diabetes Management:  Are you having any financial strains with the device, your supplies or your medication? No .  Does the patient want to be seen by Chronic Care Management for management of their diabetes?  No  Would the patient like to be referred to a Nutritionist or for Diabetic Management?  No   Diabetic Exams:  Diabetic Eye Exam: Overdue for diabetic eye exam. Pt has been advised about the importance in completing this exam. Patient advised to call and schedule an eye exam. Diabetic Foot Exam: Overdue, Pt has been advised about the importance in completing this exam. Pt is scheduled for diabetic foot exam on next appointment.   Interpreter Needed?: No  Information entered by :: NAllen LPN   Activities of Daily  Living    02/04/2022   11:54 AM  In your present state of health, do you have any difficulty performing the following activities:  Hearing? 1  Comment has hearing aids  Vision? 1  Difficulty concentrating or making decisions? 1  Walking or climbing stairs? 1  Dressing or bathing? 0  Doing errands, shopping? 0  Preparing Food and eating ? N  Using the Toilet? N  In the past six months, have you accidently leaked urine? N  Do you have problems with loss of bowel control? N  Managing your Medications? N  Managing your Finances? N  Housekeeping or managing your Housekeeping? N    Patient Care Team: Dorothyann Peng, NP as PCP - General (Family Medicine)  Indicate any recent Medical Services you may have received from other than [facility] providers in the past year (date may be approximate).     Assessment:   This is a routine wellness examination for [place].  Hearing/Vision screen Vision Screening - Comments:: Regular eye exams,   Dietary issues and exercise activities discussed: Current Exercise Habits: The patient does not participate in regular exercise at present   Goals Addressed             This Visit's Progress    Patient Stated       02/04/2022, wants to get rid of pain       Depression Screen    02/04/2022   11:54 AM 09/25/2021   10:16 AM 01/30/2021    2:11 PM 09/21/2020    1:09 PM  PHQ 2/9 Scores  PHQ - 2 Score 0 0 0 0  PHQ- 9 Score  0      Fall Risk    02/04/2022   11:54 AM 09/25/2021   10:16 AM 01/30/2021    2:10 PM 09/21/2020    1:09 PM  [place] in the past year? 0 0 0 0  Number falls in past yr: 0 0 0   Injury with Fall? 0 0 0   Risk for fall due to : Medication side effect  Impaired balance/gait   Follow  up Falls evaluation completed;Education provided;Falls prevention discussed  Falls evaluation completed     FALL RISK PREVENTION PERTAINING TO THE HOME:  Any stairs in or around the home? Yes  If so, are there any without handrails?  No  Home free of loose throw rugs in walkways, pet beds, electrical cords, etc? Yes  Adequate lighting in your home to reduce risk of falls? Yes   ASSISTIVE DEVICES UTILIZED TO PREVENT FALLS:  Life alert? No  Use of a cane, walker or w/c? No  Grab bars in the bathroom? Yes  Shower chair or bench in shower? Yes  Elevated toilet seat or a handicapped toilet? Yes   TIMED UP AND GO:  Was the test performed? No .    Gait slow and steady without use of assistive device  Cognitive Function:        02/04/2022   11:56 AM  6CIT Screen  What Year? 0 points  What month? 0 points  What time? 0 points  Count back from 20 0 points  Months in reverse 0 points  Repeat phrase 0 points  Total Score 0 points    Immunizations Immunization History  Administered Date(s) Administered   Fluad Quad(high Dose 65+) 06/22/2020   Influenza-Unspecified 07/05/2021   PFIZER(Purple Top)SARS-COV-2 Vaccination 10/10/2019, 11/06/2019    TDAP status: Due, Education has been provided regarding the importance of this vaccine. Advised may receive this vaccine at local pharmacy or Health Dept. Aware to provide a copy of the vaccination record if obtained from local pharmacy or Health Dept. Verbalized acceptance and understanding.  Flu Vaccine status: Up to date  Pneumococcal vaccine status: Declined,  Education has been provided regarding the importance of this vaccine but patient still declined. Advised may receive this vaccine at local pharmacy or Health Dept. Aware to provide a copy of the vaccination record if obtained from local pharmacy or Health Dept. Verbalized acceptance and understanding.   Covid-19 vaccine status: Completed vaccines  Qualifies for Shingles Vaccine? Yes   Zostavax completed No   Shingrix Completed?: No.    Education has been provided regarding the importance of this vaccine. Patient has been advised to call insurance company to determine out of pocket expense if they have not yet  received this vaccine. Advised may also receive vaccine at local pharmacy or Health Dept. Verbalized acceptance and understanding.  Screening Tests Health Maintenance  Topic Date Due   FOOT EXAM  Never done   OPHTHALMOLOGY EXAM  Never done   DEXA SCAN  Never done   COVID-19 Vaccine (3 - Pfizer risk series) 02/20/2022 (Originally 12/04/2019)   TETANUS/TDAP  02/28/2022 (Originally 05/06/1960)   Zoster Vaccines- Shingrix (1 of 2) 02/28/2022 (Originally 05/06/1960)   Pneumonia Vaccine 14+ Years old (1 - PCV) 02/05/2023 (Originally 05/05/2005)   INFLUENZA VACCINE  04/08/2022   HEMOGLOBIN A1C  06/24/2022   HPV VACCINES  Aged Out    Health Maintenance  Health Maintenance Due  Topic Date Due   FOOT EXAM  Never done   OPHTHALMOLOGY EXAM  Never done   DEXA SCAN  Never done    Colorectal cancer screening: No longer required.   Mammogram status: No longer required due to age.  Bone Density status: decline  Lung Cancer Screening: (Low Dose CT Chest recommended if Age 75-80 years, 30 pack-year currently smoking OR have quit w/in 15years.) does not qualify.   Lung Cancer Screening Referral: no  Additional Screening:  Hepatitis C Screening: does not qualify;   Vision Screening: Recommended  annual ophthalmology exams for early detection of glaucoma and other disorders of the eye. Is the patient up to date with their annual eye exam?  No  Who is the provider or what is the name of the office in which the patient attends annual eye exams? [facility] If pt is not established with a provider, would they like to be referred to a provider to establish care? No .   Dental Screening: Recommended annual dental exams for proper oral hygiene  Community Resource Referral / Chronic Care Management: CRR required this visit?  No   CCM required this visit?  No      Plan:     I have personally reviewed and noted the following in the patient's chart:   Medical and social history Use  of alcohol, tobacco or illicit drugs  Current medications and supplements including opioid prescriptions.  Functional ability and status Nutritional status Physical activity Advanced directives List of other physicians Hospitalizations, surgeries, and ER visits in previous 12 months Vitals Screenings to include cognitive, depression, and falls Referrals and appointments  In addition, I have reviewed and discussed with patient certain preventive protocols, quality metrics, and best practice recommendations. A written personalized care plan for preventive services as well as general preventive health recommendations were provided to patient.     Kellie Simmering, LPN   [id number]   Nurse Notes: none

## 2022-02-13 ENCOUNTER — Other Ambulatory Visit: Payer: Self-pay | Admitting: Adult Health

## 2022-02-13 DIAGNOSIS — E118 Type 2 diabetes mellitus with unspecified complications: Secondary | ICD-10-CM

## 2022-02-20 ENCOUNTER — Other Ambulatory Visit: Payer: Self-pay | Admitting: Adult Health

## 2022-03-18 ENCOUNTER — Other Ambulatory Visit: Payer: Self-pay | Admitting: Adult Health

## 2022-03-18 DIAGNOSIS — G2581 Restless legs syndrome: Secondary | ICD-10-CM

## 2022-03-19 ENCOUNTER — Encounter: Payer: Self-pay | Admitting: Adult Health

## 2022-04-01 ENCOUNTER — Encounter: Payer: Self-pay | Admitting: Adult Health

## 2022-04-01 ENCOUNTER — Ambulatory Visit (INDEPENDENT_AMBULATORY_CARE_PROVIDER_SITE_OTHER): Payer: Medicare Other | Admitting: Adult Health

## 2022-04-01 VITALS — BP 122/80 | HR 69 | Temp 97.8°F | Ht 60.0 in | Wt 172.0 lb

## 2022-04-01 DIAGNOSIS — E782 Mixed hyperlipidemia: Secondary | ICD-10-CM | POA: Diagnosis not present

## 2022-04-01 DIAGNOSIS — E118 Type 2 diabetes mellitus with unspecified complications: Secondary | ICD-10-CM | POA: Diagnosis not present

## 2022-04-01 DIAGNOSIS — I10 Essential (primary) hypertension: Secondary | ICD-10-CM | POA: Diagnosis not present

## 2022-04-01 LAB — POCT GLYCOSYLATED HEMOGLOBIN (HGB A1C): Hemoglobin A1C: 7.2 % — AB (ref 4.0–5.6)

## 2022-04-01 MED ORDER — GLIPIZIDE ER 2.5 MG PO TB24: 2.5000 mg | ORAL_TABLET | Freq: Every day | ORAL | 0 refills | Status: DC

## 2022-04-01 NOTE — Patient Instructions (Addendum)
Your A1c was 7.2 - this increased a little bit   I want you to get back into the gym- I promise you will feel better

## 2022-04-01 NOTE — Progress Notes (Signed)
Subjective:    Patient ID: Ann Thompson, female    DOB: 04-25-1941, 81 y.o.   MRN: [id number]  HPI 81 year old female who  has a past medical history of Arthritis, DM type 2 (diabetes mellitus, type 2) (HCC), GERD (gastroesophageal reflux disease), History of kidney stones, Hyperlipemia, Hypertension, Left leg pain, and Wears hearing aid in both ears.  She presents to the office today for 67-month follow-up regarding diabetes and hypertension  Diabetes mellitus type 2-managed with Januvia 50 mg daily and glipizide 2.5 mg ER daily.  She does not monitor her blood sugars at home but denies episodes of hypoglycemia. Lab Results  Component Value Date   HGBA1C 6.8 (H) 12/23/2021   HTN -managed with Ziac 2.5-6.25 mg and Maxide 37.5-25 mg daily.  She denies dizziness, lightheadedness, chest pain, shortness of breath BP Readings from Last 3 Encounters:  04/01/22 122/80  02/04/22 110/70  12/31/21 140/80   Hyperlipidemia - she reports that she stopped taking simvastatin due to myalgia. She does not want to   Review of Systems See HPI   Past Medical History:  Diagnosis Date   Arthritis    DM type 2 (diabetes mellitus, type 2) (HCC)    GERD (gastroesophageal reflux disease)    History of kidney stones    Hyperlipemia    Hypertension    Left leg pain    Wears hearing aid in both ears     Social History   Socioeconomic History   Marital status: Married    Spouse name: Not on file   Number of children: Not on file   Years of education: Not on file   Highest education level: Not on file  Occupational History   Not on file  Tobacco Use   Smoking status: Former    Packs/day: 1.00    Years: 25.00    Total pack years: 25.00    Types: Cigarettes    Quit date: 09/08/1981    Years since quitting: 40.5   Smokeless tobacco: Never  Vaping Use   Vaping Use: Never used  Substance and Sexual Activity   Alcohol use: Never   Drug use: Never   Sexual activity: Not on file  Other Topics  Concern   Not on file  Social History Narrative   Not on file   Social Determinants of Health   Financial Resource Strain: Low Risk  (02/04/2022)   Overall Financial Resource Strain (CARDIA)    Difficulty of Paying Living Expenses: Not hard at all  Food Insecurity: No Food Insecurity (02/04/2022)   Hunger Vital Sign    Worried About Running Out of Food in the Last Year: Never true    Ran Out of Food in the Last Year: Never true  Transportation Needs: No Transportation Needs (02/04/2022)   PRAPARE - Administrator, Civil Service (Medical): No    Lack of Transportation (Non-Medical): No  Physical Activity: Inactive (02/04/2022)   Exercise Vital Sign    Days of Exercise per Week: 0 days    Minutes of Exercise per Session: 0 min  Stress: No Stress Concern Present (02/04/2022)   [employer] of Occupational Health - Occupational Stress Questionnaire    Feeling of Stress : Not at all  Social Connections: Moderately Isolated (01/30/2021)   Social Connection and Isolation Panel [NHANES]    Frequency of Communication with Friends and Family: Twice a week    Frequency of Social Gatherings with Friends and Family: Twice a week  Attends Religious Services: Never    Active Member of Clubs or Organizations: No    Attends Banker Meetings: Never    Marital Status: Married  Catering manager Violence: Not At Risk (01/30/2021)   Humiliation, Afraid, Rape, and Kick questionnaire    Fear of Current or Ex-Partner: No    Emotionally Abused: No    Physically Abused: No    Sexually Abused: No    Past Surgical History:  Procedure Laterality Date   ABDOMINAL HYSTERECTOMY  1982   partial   APPENDECTOMY  1957   BACK SURGERY  07/12/2020   lower at [facility]   CYSTOSCOPY W/ URETERAL STENT PLACEMENT Right 10/06/2020   Procedure: CYSTOSCOPY WITH RETROGRADE PYELOGRAM/URETERAL STENT PLACEMENT;  Surgeon: Sebastian Ache, MD;  Location: [facility] OR;  Service: Urology;   Laterality: Right;   CYSTOSCOPY WITH RETROGRADE PYELOGRAM, URETEROSCOPY AND STENT PLACEMENT Right 10/26/2020   Procedure: CYSTOSCOPY WITH RETROGRADE PYELOGRAM, URETEROSCOPY AND STENT REPLACEMENT;  Surgeon: Sebastian Ache, MD;  Location: [facility];  Service: Urology;  Laterality: Right;  90 MINS   HOLMIUM LASER APPLICATION Right 10/26/2020   Procedure: HOLMIUM LASER APPLICATION;  Surgeon: Sebastian Ache, MD;  Location: [facility];  Service: Urology;  Laterality: Right;   JOINT REPLACEMENT  2016   both knees partial replacement    Family History  Family history unknown: Yes    No Known Allergies  Current Outpatient Medications on File Prior to Visit  Medication Sig Dispense Refill   acetaminophen (TYLENOL) 325 MG tablet      bisoprolol-hydrochlorothiazide (ZIAC) 2.5-6.25 MG tablet TAKE 1 TABLET BY MOUTH EVERY DAY 90 tablet 3   celecoxib (CELEBREX) 200 MG capsule Take 1 capsule (200 mg total) by mouth daily. 90 capsule 1   cycloSPORINE (RESTASIS) 0.05 % ophthalmic emulsion      fluticasone (FLONASE) 50 MCG/ACT nasal spray USE 2 SPRAYS IN EACH NOSTRIL EVERY DAY 16 g 5   glipiZIDE (GLUCOTROL XL) 2.5 MG 24 hr tablet Take 1 tablet (2.5 mg total) by mouth daily with breakfast. 90 tablet 0   Glucosamine-Chondroit-Vit C-Mn (GLUCOSAMINE 1500 COMPLEX) CAPS Take 1 capsule by mouth daily.     JANUVIA 50 MG tablet TAKE 1 TABLET BY MOUTH EVERY DAY 90 tablet 0   Multiple Vitamins-Minerals (PRESERVISION AREDS PO) Take 1 tablet by mouth daily.     Polyvinyl Alcohol-Povidone PF (REFRESH) 1.4-0.6 % SOLN      Potassium 75 MG TABS potassium     pramipexole (MIRAPEX) 1.5 MG tablet TAKE 1 TABLET BY MOUTH AT BEDTIME 90 tablet 1   simvastatin (ZOCOR) 10 MG tablet Take 1 tablet (10 mg total) by mouth daily. (Patient not taking: Reported on 02/04/2022) 90 tablet 3   Tetrahydrozoline-Zn Sulfate (EYE DROPS A/C OP) Apply 1 drop to eye daily as needed (dry eyes).     triamcinolone cream  (KENALOG) 0.1 % 1 application     triamterene-hydrochlorothiazide (MAXZIDE-25) 37.5-25 MG tablet TAKE 1 TABLET BY MOUTH EVERY DAY 90 tablet 0   UNABLE TO FIND Med Name: cbd oil to knees prn     vitamin B-12 (CYANOCOBALAMIN) 100 MCG tablet Vitamin B12     No current facility-administered medications on file prior to visit.    There were no vitals taken for this visit.      Objective:   Physical Exam Vitals and nursing note reviewed.  Constitutional:      Appearance: Normal appearance.  Cardiovascular:     Rate and Rhythm: Normal  rate and regular rhythm.     Pulses: Normal pulses.     Heart sounds: Normal heart sounds.  Pulmonary:     Effort: Pulmonary effort is normal.     Breath sounds: Normal breath sounds.  Musculoskeletal:        General: Normal range of motion.  Skin:    General: Skin is warm and dry.     Capillary Refill: Capillary refill takes less than 2 seconds.  Neurological:     General: No focal deficit present.     Mental Status: She is oriented to person, place, and time.  Psychiatric:        Mood and Affect: Mood normal.        Behavior: Behavior normal.        Thought Content: Thought content normal.        Judgment: Judgment normal.       Assessment & Plan:  1. Controlled type 2 diabetes mellitus with complication, without long-term current use of insulin (HCC)  - POC HgB A1c- 7.2 - has increased - Will hold off on medication adjustments at this time. I would like her to get back to the gym and start swimming agin  - Follow up in three months  - glipiZIDE (GLUCOTROL XL) 2.5 MG 24 hr tablet; Take 1 tablet (2.5 mg total) by mouth daily with breakfast.  Dispense: 90 tablet; Refill: 0  2. Primary hypertension - well controlled.  - No changes in blood pressure medication   3. Mixed hyperlipidemia - refuses to take another statin   [name], NP

## 2022-05-17 ENCOUNTER — Other Ambulatory Visit: Payer: Self-pay | Admitting: Adult Health

## 2022-05-17 DIAGNOSIS — E118 Type 2 diabetes mellitus with unspecified complications: Secondary | ICD-10-CM

## 2022-06-17 ENCOUNTER — Other Ambulatory Visit: Payer: Self-pay | Admitting: Adult Health

## 2022-06-19 ENCOUNTER — Other Ambulatory Visit: Payer: Self-pay | Admitting: Adult Health

## 2022-06-19 DIAGNOSIS — E118 Type 2 diabetes mellitus with unspecified complications: Secondary | ICD-10-CM

## 2022-06-20 NOTE — Telephone Encounter (Signed)
Patient need to schedule an ov for more refills. 

## 2022-06-21 ENCOUNTER — Other Ambulatory Visit: Payer: Self-pay | Admitting: Adult Health

## 2022-06-24 ENCOUNTER — Telehealth: Payer: Self-pay | Admitting: Adult Health

## 2022-06-24 NOTE — Telephone Encounter (Signed)
Pt's Spouse called to ask NP for a referral to see: [facility] [address] [place], [place] [address]  [phone]  LOV:  04/01/22

## 2022-06-25 ENCOUNTER — Encounter: Payer: Self-pay | Admitting: Adult Health

## 2022-06-25 NOTE — Telephone Encounter (Signed)
Pt has an appt. Tomorrow pt thinks she has arthritis.

## 2022-06-25 NOTE — Telephone Encounter (Signed)
Pt called, returning CMA's call. CMA was unavailable. Pt asked that CMA call back at her earliest convenience. 

## 2022-06-26 ENCOUNTER — Ambulatory Visit: Payer: Medicare Other | Admitting: Adult Health

## 2022-06-27 ENCOUNTER — Ambulatory Visit (INDEPENDENT_AMBULATORY_CARE_PROVIDER_SITE_OTHER): Payer: Medicare Other | Admitting: Adult Health

## 2022-06-27 VITALS — BP 180/100 | HR 80 | Temp 98.0°F | Ht 60.0 in | Wt 174.0 lb

## 2022-06-27 DIAGNOSIS — M79641 Pain in right hand: Secondary | ICD-10-CM

## 2022-06-27 DIAGNOSIS — M79642 Pain in left hand: Secondary | ICD-10-CM

## 2022-06-27 DIAGNOSIS — M1990 Unspecified osteoarthritis, unspecified site: Secondary | ICD-10-CM | POA: Diagnosis not present

## 2022-06-27 DIAGNOSIS — L299 Pruritus, unspecified: Secondary | ICD-10-CM | POA: Diagnosis not present

## 2022-06-27 MED ORDER — SERTRALINE HCL 25 MG PO TABS: 25.0000 mg | ORAL_TABLET | Freq: Every day | ORAL | 0 refills | Status: DC

## 2022-06-27 MED ORDER — MELOXICAM 15 MG PO TABS: 15.0000 mg | ORAL_TABLET | Freq: Every day | ORAL | 0 refills | Status: DC

## 2022-06-27 NOTE — Progress Notes (Signed)
Subjective:    Patient ID: Ann Thompson, female    DOB: 1941-05-15, 81 y.o.   MRN: [id number]  HPI 81 year old female who  has a past medical history of Arthritis, DM type 2 (diabetes mellitus, type 2) ([place]), GERD (gastroesophageal reflux disease), History of kidney stones, Hyperlipemia, Hypertension, Left leg pain, and Wears hearing aid in both ears.  She presents to the office today for multiple issues.  Her first issue is that chronic arthritic pain.  She reports pain in both hands, hips, knees, and legs.  She is taking Celebrex and believes that this helps to some degree, she was doing water aerobics and this also helped but she stopped doing it due to fear of getting COVID while there.  She has noticed that with her hands that some of her fingers on both hands.  She is seen by orthopedics periodically, history of bilateral knee replacements   In her hands she has started to notice a aching sensation where it feels as though her fingers catch when she tries to open them.  This does not happen all the time but it can be painful at times when she has to spontaneously extend the affected digits.  She reports worsening pain at the base of bilateral thumbs.  Currently she reports chronic pruritus "I have itching all over my body all the time".  She has been seen by dermatology and reports "trying all kinds of lotions and creams and nothing has helped". Denies rash     Review of Systems See HPI   Past Medical History:  Diagnosis Date   Arthritis    DM type 2 (diabetes mellitus, type 2) (HCC)    GERD (gastroesophageal reflux disease)    History of kidney stones    Hyperlipemia    Hypertension    Left leg pain    Wears hearing aid in both ears     Social History   Socioeconomic History   Marital status: Married    Spouse name: Not on file   Number of children: Not on file   Years of education: Not on file   Highest education level: GED or equivalent  Occupational History   Not  on file  Tobacco Use   Smoking status: Former    Packs/day: 1.00    Years: 25.00    Total pack years: 25.00    Types: Cigarettes    Quit date: 09/08/1981    Years since quitting: 40.8   Smokeless tobacco: Never  Vaping Use   Vaping Use: Never used  Substance and Sexual Activity   Alcohol use: Never   Drug use: Never   Sexual activity: Not on file  Other Topics Concern   Not on file  Social History Narrative   Not on file   Social Determinants of Health   Financial Resource Strain: Low Risk  (06/27/2022)   Overall Financial Resource Strain (CARDIA)    Difficulty of Paying Living Expenses: Not hard at all  Food Insecurity: No Food Insecurity (06/27/2022)   Hunger Vital Sign    Worried About Running Out of Food in the Last Year: Never true    Ran Out of Food in the Last Year: Never true  Transportation Needs: No Transportation Needs (06/27/2022)   PRAPARE - Hydrologist (Medical): No    Lack of Transportation (Non-Medical): No  Physical Activity: Insufficiently Active (06/27/2022)   Exercise Vital Sign    Days of Exercise per Week: 1  day    Minutes of Exercise per Session: 60 min  Stress: No Stress Concern Present (06/27/2022)   [place]    Feeling of Stress : Not at all  Social Connections: Moderately Integrated (06/27/2022)   Social Connection and Isolation Panel [NHANES]    Frequency of Communication with Friends and Family: More than three times a week    Frequency of Social Gatherings with Friends and Family: More than three times a week    Attends Religious Services: 1 to 4 times per year    Active Member of [employer] or Organizations: No    Attends Archivist Meetings: Not on file    Marital Status: Married  Intimate Partner Violence: Not At Risk (01/30/2021)   Humiliation, Afraid, Rape, and Kick questionnaire    Fear of Current or Ex-Partner: No    Emotionally  Abused: No    Physically Abused: No    Sexually Abused: No    Past Surgical History:  Procedure Laterality Date   ABDOMINAL HYSTERECTOMY  1982   partial   [place]  07/12/2020   lower at [facility]   [place] Right 10/06/2020   Procedure: [place];  Surgeon: Alexis Frock, MD;  Location: [facility];  Service: Urology;  Laterality: Right;   CYSTOSCOPY WITH RETROGRADE PYELOGRAM, URETEROSCOPY AND STENT PLACEMENT Right 10/26/2020   Procedure: CYSTOSCOPY WITH RETROGRADE PYELOGRAM, URETEROSCOPY AND STENT REPLACEMENT;  Surgeon: Alexis Frock, MD;  Location: [facility];  Service: Urology;  Laterality: Right;  90 MINS   HOLMIUM LASER APPLICATION Right [id number]   Procedure: HOLMIUM LASER APPLICATION;  Surgeon: Alexis Frock, MD;  Location: [facility];  Service: Urology;  Laterality: Right;   JOINT REPLACEMENT  2016   both knees partial replacement    Family History  Family history unknown: Yes    Allergies  Allergen Reactions   Simvastatin     Mylagia     Current Outpatient Medications on File Prior to Visit  Medication Sig Dispense Refill   acetaminophen (TYLENOL) 325 MG tablet      bisoprolol-hydrochlorothiazide (ZIAC) 2.5-6.25 MG tablet TAKE 1 TABLET BY MOUTH EVERY DAY 90 tablet 3   celecoxib (CELEBREX) 200 MG capsule Take 1 capsule (200 mg total) by mouth daily. 90 capsule 1   cycloSPORINE (RESTASIS) 0.05 % ophthalmic emulsion      FLAREX 0.1 % ophthalmic suspension Apply to eye.     fluticasone (FLONASE) 50 MCG/ACT nasal spray USE 2 SPRAYS IN EACH NOSTRIL EVERY DAY 16 g 5   glipiZIDE (GLUCOTROL XL) 2.5 MG 24 hr tablet Take 1 tablet (2.5 mg total) by mouth daily with breakfast. 90 tablet 0   Glucosamine-Chondroit-Vit C-Mn (GLUCOSAMINE 1500 COMPLEX) CAPS Take 1 capsule by mouth daily.     JANUVIA 50 MG tablet TAKE 1 TABLET BY MOUTH  EVERY DAY 90 tablet 0   Multiple Vitamins-Minerals (PRESERVISION AREDS PO) Take 1 tablet by mouth daily.     Polyvinyl Alcohol-Povidone PF (REFRESH) 1.4-0.6 % SOLN      Potassium 75 MG TABS potassium     pramipexole (MIRAPEX) 1.5 MG tablet TAKE 1 TABLET BY MOUTH AT BEDTIME 90 tablet 1   Tetrahydrozoline-Zn Sulfate (EYE DROPS A/C OP) Apply 1 drop to eye daily as needed (dry eyes).     triamcinolone cream (KENALOG) 0.1 % 1 application     triamterene-hydrochlorothiazide (MAXZIDE-25) 37.5-25  MG tablet TAKE 1 TABLET BY MOUTH EVERY DAY 90 tablet 0   UNABLE TO FIND Med Name: cbd oil to knees prn     vitamin B-12 (CYANOCOBALAMIN) 100 MCG tablet Vitamin B12     No current facility-administered medications on file prior to visit.    BP (!) 180/100   Pulse 80   Temp 98 F (36.7 C) (Oral)   Ht 5' (1.524 m)   Wt 174 lb (78.9 kg)   SpO2 97%   BMI 33.98 kg/m       Objective:   Physical Exam Vitals and nursing note reviewed.  Constitutional:      Appearance: Normal appearance.  Cardiovascular:     Rate and Rhythm: Normal rate and regular rhythm.     Pulses: Normal pulses.     Heart sounds: Normal heart sounds.  Pulmonary:     Effort: Pulmonary effort is normal.     Breath sounds: Normal breath sounds.  Musculoskeletal:        General: Normal range of motion.     Comments: She does have tenderness with palpation to multiple MCP joints on the volar aspect of bilateral hands.  No trigger noted today  Skin:    General: Skin is warm and dry.     Capillary Refill: Capillary refill takes less than 2 seconds.     Coloration: Skin is not jaundiced.     Findings: No lesion or rash.  Neurological:     General: No focal deficit present.     Mental Status: She is alert and oriented to person, place, and time.  Psychiatric:        Mood and Affect: Mood normal.        Behavior: Behavior normal.        Thought Content: Thought content normal.        Judgment: Judgment normal.        Assessment & Plan:  1. Chronic pruritus - Will trial her on low dose zoloft.  - Follow up in 30 days  - sertraline (ZOLOFT) 25 MG tablet; Take 1 tablet (25 mg total) by mouth daily.  Dispense: 30 tablet; Refill: 0  2. Chronic osteoarthritis - Stop Celebrex - Will trial her on Mobic  - Reevaluate in 30 days  - Encouraged getting back to water aerobics  - meloxicam (MOBIC) 15 MG tablet; Take 1 tablet (15 mg total) by mouth daily.  Dispense: 30 tablet; Refill: 0  3. Bilateral hand pain  - Ambulatory referral to Hand Surgery - meloxicam (MOBIC) 15 MG tablet; Take 1 tablet (15 mg total) by mouth daily.  Dispense: 30 tablet; Refill: 0  Dorothyann Peng, NP

## 2022-06-27 NOTE — Patient Instructions (Addendum)
I am going to have you stop Celexbrex and I will send in a new anti inflammatory caused Meloxicam.   I am going to send in a antidepressent that may help for the itching   I will send you to a hand specialist for your hand pain  Lets follow up in one month

## 2022-07-14 ENCOUNTER — Telehealth: Payer: Self-pay | Admitting: Adult Health

## 2022-07-14 DIAGNOSIS — Z7189 Other specified counseling: Secondary | ICD-10-CM

## 2022-07-14 NOTE — Telephone Encounter (Signed)
Pt's spouse called to request a referral to:  [facility] [phone]

## 2022-07-15 NOTE — Telephone Encounter (Signed)
Okay for referral?

## 2022-07-16 NOTE — Telephone Encounter (Signed)
Tried to call pt again for update no answer. Closing note.

## 2022-07-16 NOTE — Telephone Encounter (Signed)
Referral has been placed. Lm for pt to return call for update.

## 2022-07-16 NOTE — Telephone Encounter (Signed)
I thought this was routed to you  [facility] to refer?

## 2022-07-23 ENCOUNTER — Ambulatory Visit (INDEPENDENT_AMBULATORY_CARE_PROVIDER_SITE_OTHER): Payer: Medicare Other | Admitting: Adult Health

## 2022-07-23 ENCOUNTER — Encounter: Payer: Self-pay | Admitting: Adult Health

## 2022-07-23 VITALS — BP 138/80 | HR 70 | Temp 97.7°F | Ht 60.0 in | Wt 174.0 lb

## 2022-07-23 DIAGNOSIS — M1990 Unspecified osteoarthritis, unspecified site: Secondary | ICD-10-CM

## 2022-07-23 DIAGNOSIS — L299 Pruritus, unspecified: Secondary | ICD-10-CM | POA: Diagnosis not present

## 2022-07-23 MED ORDER — SERTRALINE HCL 25 MG PO TABS: 25.0000 mg | ORAL_TABLET | Freq: Every day | ORAL | 1 refills | Status: DC

## 2022-07-23 NOTE — Progress Notes (Signed)
Subjective:    Patient ID: Ann Thompson, female    DOB: 06/18/1941, 81 y.o.   MRN: [id number]  HPI  81 year old female who  has a past medical history of Arthritis, DM type 2 (diabetes mellitus, type 2) (HCC), GERD (gastroesophageal reflux disease), History of kidney stones, Hyperlipemia, Hypertension, Left leg pain, and Wears hearing aid in both ears.  She presents to the office today for 30-day follow-up regarding chronic osteoarthritis and pruritus.    During her last visit we had her stop Celebrex she did not feel as though it was helping and we started her on Mobic.  Most of her arthritis is in both hands, bilateral hips, bilateral knees, and legs.  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  Today she reports that she did try the meloxicam but did not find that it significantly reduced her pain, she stopped using it and went back on Celebrex and noticed that the Celebrex was actually helping to some degree.  She still does have significant pain in her shoulders, hips, and knees.  She plans on following up with her orthopedic doctor. She is back at doing water aerobics at [facility] but is only going once a week .   Additionally she was also complaining of chronic pruritus during her last visit.  She had been to dermatology but they could not find a reason for the itching.  We decided to try starting her on low-dose Zoloft 25 mg daily.  Today she is happy to report that her itching has resolved completely.  The Zoloft does make her slightly tired during the day but she is happy with the results.  Review of Systems See HPI   Past Medical History:  Diagnosis Date   Arthritis    DM type 2 (diabetes mellitus, type 2) (HCC)    GERD (gastroesophageal reflux disease)    History of kidney stones    Hyperlipemia    Hypertension    Left leg pain    Wears hearing aid in both ears     Social History   Socioeconomic History   Marital status: Married    Spouse name:  Not on file   Number of children: Not on file   Years of education: Not on file   Highest education level: GED or equivalent  Occupational History   Not on file  Tobacco Use   Smoking status: Former    Packs/day: 1.00    Years: 25.00    Total pack years: 25.00    Types: Cigarettes    Quit date: 09/08/1981    Years since quitting: 40.8   Smokeless tobacco: Never  Vaping Use   Vaping Use: Never used  Substance and Sexual Activity   Alcohol use: Never   Drug use: Never   Sexual activity: Not on file  Other Topics Concern   Not on file  Social History Narrative   Not on file   Social Determinants of Health   Financial Resource Strain: Low Risk  (06/27/2022)   Overall Financial Resource Strain (CARDIA)    Difficulty of Paying Living Expenses: Not hard at all  Food Insecurity: No Food Insecurity (06/27/2022)   Hunger Vital Sign    Worried About Running Out of Food in the Last Year: Never true    Ran Out of Food in the Last Year: Never true  Transportation Needs: No Transportation Needs (06/27/2022)   PRAPARE - Administrator, Civil Service (Medical): No  Lack of Transportation (Non-Medical): No  Physical Activity: Insufficiently Active (06/27/2022)   Exercise Vital Sign    Days of Exercise per Week: 1 day    Minutes of Exercise per Session: 60 min  Stress: No Stress Concern Present (06/27/2022)   [employer] of Occupational Health - Occupational Stress Questionnaire    Feeling of Stress : Not at all  Social Connections: Moderately Integrated (06/27/2022)   Social Connection and Isolation Panel [NHANES]    Frequency of Communication with Friends and Family: More than three times a week    Frequency of Social Gatherings with Friends and Family: More than three times a week    Attends Religious Services: 1 to 4 times per year    Active Member of [employer] or Organizations: No    Attends Banker Meetings: Not on file    Marital Status: Married   Intimate Partner Violence: Not At Risk (01/30/2021)   Humiliation, Afraid, Rape, and Kick questionnaire    Fear of Current or Ex-Partner: No    Emotionally Abused: No    Physically Abused: No    Sexually Abused: No    Past Surgical History:  Procedure Laterality Date   ABDOMINAL HYSTERECTOMY  1982   partial   APPENDECTOMY  1957   BACK SURGERY  07/12/2020   lower at [facility]   CYSTOSCOPY W/ URETERAL STENT PLACEMENT Right 10/06/2020   Procedure: CYSTOSCOPY WITH RETROGRADE PYELOGRAM/URETERAL STENT PLACEMENT;  Surgeon: Sebastian Ache, MD;  Location: [facility] OR;  Service: Urology;  Laterality: Right;   CYSTOSCOPY WITH RETROGRADE PYELOGRAM, URETEROSCOPY AND STENT PLACEMENT Right 10/26/2020   Procedure: CYSTOSCOPY WITH RETROGRADE PYELOGRAM, URETEROSCOPY AND STENT REPLACEMENT;  Surgeon: Sebastian Ache, MD;  Location: [facility];  Service: Urology;  Laterality: Right;  90 MINS   HOLMIUM LASER APPLICATION Right 10/26/2020   Procedure: HOLMIUM LASER APPLICATION;  Surgeon: Sebastian Ache, MD;  Location: [facility];  Service: Urology;  Laterality: Right;   JOINT REPLACEMENT  2016   both knees partial replacement    Family History  Family history unknown: Yes    Allergies  Allergen Reactions   Diclofenac Swelling    1Suspected to have caused hives 2015-- had allergy testing  1Suspected to have caused hives 2015-- had allergy testing                                                    Tongue swelling  1Suspected to have caused hives 2015-- had allergy testing  1Suspected to have caused hives 2015-- had allergy testing                                                    Tongue swelling   Simvastatin     Mylagia    Metformin Other (See Comments)    Current Outpatient Medications on File Prior to Visit  Medication Sig Dispense Refill   acetaminophen (TYLENOL) 325 MG tablet      bisoprolol-hydrochlorothiazide (ZIAC) 2.5-6.25 MG tablet TAKE 1 TABLET  BY MOUTH EVERY DAY 90 tablet 3   cycloSPORINE (RESTASIS) 0.05 % ophthalmic emulsion      FLAREX 0.1 % ophthalmic suspension Apply to eye.  fluticasone (FLONASE) 50 MCG/ACT nasal spray USE 2 SPRAYS IN EACH NOSTRIL EVERY DAY 16 g 5   glipiZIDE (GLUCOTROL XL) 2.5 MG 24 hr tablet Take 1 tablet (2.5 mg total) by mouth daily with breakfast. 90 tablet 0   Glucosamine-Chondroit-Vit C-Mn (GLUCOSAMINE 1500 COMPLEX) CAPS Take 1 capsule by mouth daily.     JANUVIA 50 MG tablet TAKE 1 TABLET BY MOUTH EVERY DAY 90 tablet 0   Multiple Vitamins-Minerals (PRESERVISION AREDS PO) Take 1 tablet by mouth daily.     Polyvinyl Alcohol-Povidone PF (REFRESH) 1.4-0.6 % SOLN      Potassium 75 MG TABS potassium     pramipexole (MIRAPEX) 1.5 MG tablet TAKE 1 TABLET BY MOUTH AT BEDTIME 90 tablet 1   Tetrahydrozoline-Zn Sulfate (EYE DROPS A/C OP) Apply 1 drop to eye daily as needed (dry eyes).     triamcinolone cream (KENALOG) 0.1 % 1 application     triamterene-hydrochlorothiazide (MAXZIDE-25) 37.5-25 MG tablet TAKE 1 TABLET BY MOUTH EVERY DAY 90 tablet 0   UNABLE TO FIND Med Name: cbd oil to knees prn     vitamin B-12 (CYANOCOBALAMIN) 100 MCG tablet Vitamin B12     No current facility-administered medications on file prior to visit.    BP 138/80   Pulse 70   Temp 97.7 F (36.5 C) (Oral)   Ht 5' (1.524 m)   Wt 174 lb (78.9 kg)   SpO2 98%   BMI 33.98 kg/m       Objective:   Physical Exam Vitals and nursing note reviewed.  Constitutional:      Appearance: Normal appearance.  Cardiovascular:     Rate and Rhythm: Normal rate and regular rhythm.     Pulses: Normal pulses.     Heart sounds: Normal heart sounds.  Pulmonary:     Effort: Pulmonary effort is normal.     Breath sounds: Normal breath sounds.  Musculoskeletal:        General: Tenderness present. Normal range of motion.  Skin:    General: Skin is warm and dry.  Neurological:     General: No focal deficit present.     Mental Status: She is  alert and oriented to person, place, and time.  Psychiatric:        Mood and Affect: Mood normal.        Behavior: Behavior normal.        Thought Content: Thought content normal.        Judgment: Judgment normal.        Assessment & Plan:  1. Chronic osteoarthritis - Continue with Celebrex.  - She can also try Voltaren Gel  - Follow up with orthopedics  - Increase water therapy to three times a week   2. Chronic pruritus - Will have her switch from day time to nighttime dosing.  - sertraline (ZOLOFT) 25 MG tablet; Take 1 tablet (25 mg total) by mouth at bedtime.  Dispense: 90 tablet; Refill: 1  [name], NP  Time spent with patient today was 30 minutes which consisted of chart review, discussing diagnosis, work up, treatment answering questions, listening, and documentation.

## 2022-07-23 NOTE — Patient Instructions (Addendum)
I am glad the Zoloft helped with the itching. I am going to have you start taking this at night   You can try Voltaren Gel to help with the arthritic pain   Also you can follow up with Dr. Magnus Ivan your orthopedic doctor Address: [address], [place], [place] [address] Phone: [phone]

## 2022-07-25 ENCOUNTER — Other Ambulatory Visit: Payer: Self-pay | Admitting: Adult Health

## 2022-07-29 ENCOUNTER — Ambulatory Visit: Payer: Medicare Other | Admitting: Adult Health

## 2022-08-11 ENCOUNTER — Telehealth: Payer: Self-pay

## 2022-08-11 NOTE — Telephone Encounter (Signed)
Error

## 2022-08-18 ENCOUNTER — Other Ambulatory Visit: Payer: Self-pay | Admitting: Adult Health

## 2022-08-18 DIAGNOSIS — E118 Type 2 diabetes mellitus with unspecified complications: Secondary | ICD-10-CM

## 2022-09-03 ENCOUNTER — Other Ambulatory Visit: Payer: Self-pay | Admitting: Adult Health

## 2022-09-03 DIAGNOSIS — G2581 Restless legs syndrome: Secondary | ICD-10-CM

## 2022-09-10 ENCOUNTER — Other Ambulatory Visit: Payer: Self-pay | Admitting: Adult Health

## 2022-10-15 ENCOUNTER — Other Ambulatory Visit: Payer: Self-pay | Admitting: Adult Health

## 2022-10-15 DIAGNOSIS — L299 Pruritus, unspecified: Secondary | ICD-10-CM

## 2022-10-30 ENCOUNTER — Ambulatory Visit: Payer: Medicare Other | Admitting: Adult Health

## 2022-10-30 ENCOUNTER — Ambulatory Visit (INDEPENDENT_AMBULATORY_CARE_PROVIDER_SITE_OTHER): Payer: Medicare Other | Admitting: Adult Health

## 2022-10-30 VITALS — BP 120/80 | HR 79 | Temp 98.2°F | Ht 60.0 in | Wt 175.0 lb

## 2022-10-30 DIAGNOSIS — J0110 Acute frontal sinusitis, unspecified: Secondary | ICD-10-CM | POA: Diagnosis not present

## 2022-10-30 DIAGNOSIS — E118 Type 2 diabetes mellitus with unspecified complications: Secondary | ICD-10-CM

## 2022-10-30 MED ORDER — DOXYCYCLINE HYCLATE 100 MG PO CAPS: 100.0000 mg | ORAL_CAPSULE | Freq: Two times a day (BID) | ORAL | 0 refills | Status: DC

## 2022-10-30 MED ORDER — GLIPIZIDE ER 2.5 MG PO TB24: 2.5000 mg | ORAL_TABLET | Freq: Every day | ORAL | 0 refills | Status: DC

## 2022-10-30 NOTE — Patient Instructions (Addendum)
You have a sinus infection - I am going to send in an antibiotic called Doxycyline to help clear you up.   Please schedule a physical exam in about a month

## 2022-10-30 NOTE — Progress Notes (Signed)
Subjective:    Patient ID: Ann Thompson, female    DOB: September 10, 1940, 82 y.o.   MRN: [id number]  Cough   82 year old female who  has a past medical history of Arthritis, DM type 2 (diabetes mellitus, type 2) ([place]), GERD (gastroesophageal reflux disease), History of kidney stones, Hyperlipemia, Hypertension, Left leg pain, and Wears hearing aid in both ears.  She presents to the office today for an acute issue. She reports that for the last 10 days she has been experiencing a productive cough, sinus congestion with sinus pressure in the frontal sinuses and fatigue. She denies fevers or chills.   She has been using dayquil and nyquil without much relief.   Review of Systems  Respiratory:  Positive for cough.    See HPI   Past Medical History:  Diagnosis Date   Arthritis    DM type 2 (diabetes mellitus, type 2) (HCC)    GERD (gastroesophageal reflux disease)    History of kidney stones    Hyperlipemia    Hypertension    Left leg pain    Wears hearing aid in both ears     Social History   Socioeconomic History   Marital status: Married    Spouse name: Not on file   Number of children: Not on file   Years of education: Not on file   Highest education level: GED or equivalent  Occupational History   Not on file  Tobacco Use   Smoking status: Former    Packs/day: 1.00    Years: 25.00    Total pack years: 25.00    Types: Cigarettes    Quit date: 09/08/1981    Years since quitting: 41.1   Smokeless tobacco: Never  Vaping Use   Vaping Use: Never used  Substance and Sexual Activity   Alcohol use: Never   Drug use: Never   Sexual activity: Not on file  Other Topics Concern   Not on file  Social History Narrative   Not on file   Social Determinants of Health   Financial Resource Strain: Low Risk  (06/27/2022)   Overall Financial Resource Strain (CARDIA)    Difficulty of Paying Living Expenses: Not hard at all  Food Insecurity: No Food Insecurity (06/27/2022)    Hunger Vital Sign    Worried About Running Out of Food in the Last Year: Never true    Ran Out of Food in the Last Year: Never true  Transportation Needs: No Transportation Needs (06/27/2022)   PRAPARE - Hydrologist (Medical): No    Lack of Transportation (Non-Medical): No  Physical Activity: Insufficiently Active (06/27/2022)   Exercise Vital Sign    Days of Exercise per Week: 1 day    Minutes of Exercise per Session: 60 min  Stress: No Stress Concern Present (06/27/2022)   [place]    Feeling of Stress : Not at all  Social Connections: Moderately Integrated (06/27/2022)   Social Connection and Isolation Panel [NHANES]    Frequency of Communication with Friends and Family: More than three times a week    Frequency of Social Gatherings with Friends and Family: More than three times a week    Attends Religious Services: 1 to 4 times per year    Active Member of [employer] or Organizations: No    Attends Archivist Meetings: Not on file    Marital Status: Married  Human resources officer Violence: Not  At Risk (01/30/2021)   Humiliation, Afraid, Rape, and Kick questionnaire    Fear of Current or Ex-Partner: No    Emotionally Abused: No    Physically Abused: No    Sexually Abused: No    Past Surgical History:  Procedure Laterality Date   ABDOMINAL HYSTERECTOMY  1982   partial   [place] SURGERY  07/12/2020   lower at [facility]   [place] Right 10/06/2020   Procedure: CYSTOSCOPY WITH RETROGRADE PYELOGRAM/URETERAL STENT PLACEMENT;  Surgeon: Alexis Frock, MD;  Location: [facility];  Service: Urology;  Laterality: Right;   CYSTOSCOPY WITH RETROGRADE PYELOGRAM, URETEROSCOPY AND STENT PLACEMENT Right 10/26/2020   Procedure: CYSTOSCOPY WITH RETROGRADE PYELOGRAM, URETEROSCOPY AND STENT REPLACEMENT;  Surgeon: Alexis Frock, MD;  Location:  [facility];  Service: Urology;  Laterality: Right;  90 MINS   HOLMIUM LASER APPLICATION Right [id number]   Procedure: HOLMIUM LASER APPLICATION;  Surgeon: Alexis Frock, MD;  Location: [facility];  Service: Urology;  Laterality: Right;   JOINT REPLACEMENT  2016   both knees partial replacement    Family History  Family history unknown: Yes    Allergies  Allergen Reactions   Diclofenac Swelling    1Suspected to have caused hives 2015-- had allergy testing  1Suspected to have caused hives 2015-- had allergy testing                                                    Tongue swelling  1Suspected to have caused hives 2015-- had allergy testing  1Suspected to have caused hives 2015-- had allergy testing                                                    Tongue swelling   Simvastatin     Mylagia    Metformin Other (See Comments)    Current Outpatient Medications on File Prior to Visit  Medication Sig Dispense Refill   acetaminophen (TYLENOL) 325 MG tablet      bisoprolol-hydrochlorothiazide (ZIAC) 2.5-6.25 MG tablet TAKE 1 TABLET BY MOUTH EVERY DAY 90 tablet 3   celecoxib (CELEBREX) 200 MG capsule TAKE 1 CAPSULE BY MOUTH EVERY DAY 90 capsule 1   cycloSPORINE (RESTASIS) 0.05 % ophthalmic emulsion      FLAREX 0.1 % ophthalmic suspension Apply to eye.     fluticasone (FLONASE) 50 MCG/ACT nasal spray USE 2 SPRAYS IN EACH NOSTRIL EVERY DAY 16 g 5   Glucosamine-Chondroit-Vit C-Mn (GLUCOSAMINE 1500 COMPLEX) CAPS Take 1 capsule by mouth daily.     JANUVIA 50 MG tablet TAKE 1 TABLET BY MOUTH EVERY DAY 90 tablet 0   Multiple Vitamins-Minerals (PRESERVISION AREDS PO) Take 1 tablet by mouth daily.     Polyvinyl Alcohol-Povidone PF (REFRESH) 1.4-0.6 % SOLN      Potassium 75 MG TABS potassium     pramipexole (MIRAPEX) 1.5 MG tablet TAKE 1 TABLET BY MOUTH AT BEDTIME 90 tablet 1   sertraline (ZOLOFT) 25 MG tablet TAKE 1 TABLET BY MOUTH NIGHTLY AT BEDTIME 90 tablet 1    Tetrahydrozoline-Zn Sulfate (EYE DROPS A/C OP) Apply 1 drop to eye daily as needed (  dry eyes).     triamcinolone cream (KENALOG) 0.1 % 1 application     triamterene-hydrochlorothiazide (MAXZIDE-25) 37.5-25 MG tablet TAKE 1 TABLET BY MOUTH EVERY DAY 90 tablet 0   UNABLE TO FIND Med Name: cbd oil to knees prn     vitamin B-12 (CYANOCOBALAMIN) 100 MCG tablet Vitamin B12     No current facility-administered medications on file prior to visit.    BP 120/80   Pulse 79   Temp 98.2 F (36.8 C) (Oral)   Ht 5' (1.524 m)   Wt 175 lb (79.4 kg)   SpO2 96%   BMI 34.18 kg/m       Objective:   Physical Exam Vitals and nursing note reviewed.  Constitutional:      Appearance: Normal appearance.  HENT:     Nose: Congestion and rhinorrhea present. Rhinorrhea is purulent.     Right Turbinates: Enlarged and swollen.     Left Turbinates: Enlarged and swollen.     Right Sinus: Frontal sinus tenderness present.     Left Sinus: Frontal sinus tenderness present.     Mouth/Throat:     Mouth: Mucous membranes are moist.     Pharynx: Oropharynx is clear. Uvula midline. No oropharyngeal exudate or posterior oropharyngeal erythema.  Cardiovascular:     Rate and Rhythm: Normal rate and regular rhythm.     Pulses: Normal pulses.     Heart sounds: Normal heart sounds.  Pulmonary:     Effort: Pulmonary effort is normal.     Breath sounds: Normal breath sounds.  Musculoskeletal:        General: Normal range of motion.     Cervical back: Normal range of motion.  Skin:    General: Skin is warm and dry.  Neurological:     General: No focal deficit present.     Mental Status: She is alert and oriented to person, place, and time.  Psychiatric:        Mood and Affect: Mood normal.        Behavior: Behavior normal.        Thought Content: Thought content normal.        Judgment: Judgment normal.       Assessment & Plan:  1. Acute non-recurrent frontal sinusitis - Start on doxycyline for sinusitis   - doxycycline (VIBRAMYCIN) 100 MG capsule; Take 1 capsule (100 mg total) by mouth 2 (two) times daily.  Dispense: 14 capsule; Refill: 0  2. Controlled type 2 diabetes mellitus with complication, without long-term current use of insulin (HCC) - Needs refill. Will recheck A1c at her CPE next month  - glipiZIDE (GLUCOTROL XL) 2.5 MG 24 hr tablet; Take 1 tablet (2.5 mg total) by mouth daily with breakfast.  Dispense: 90 tablet; Refill: 0  Dorothyann Peng, NP

## 2022-11-03 ENCOUNTER — Encounter: Payer: Self-pay | Admitting: Adult Health

## 2022-11-04 ENCOUNTER — Other Ambulatory Visit: Payer: Self-pay | Admitting: Adult Health

## 2022-11-04 DIAGNOSIS — E118 Type 2 diabetes mellitus with unspecified complications: Secondary | ICD-10-CM

## 2022-11-04 MED ORDER — AMOXICILLIN 500 MG PO CAPS: 500.0000 mg | ORAL_CAPSULE | Freq: Two times a day (BID) | ORAL | 0 refills | Status: AC

## 2022-11-04 NOTE — Telephone Encounter (Signed)
Please advise 

## 2022-11-05 ENCOUNTER — Other Ambulatory Visit: Payer: Self-pay | Admitting: Adult Health

## 2022-11-05 DIAGNOSIS — E118 Type 2 diabetes mellitus with unspecified complications: Secondary | ICD-10-CM

## 2022-11-12 ENCOUNTER — Other Ambulatory Visit: Payer: Self-pay | Admitting: Adult Health

## 2022-11-12 DIAGNOSIS — E118 Type 2 diabetes mellitus with unspecified complications: Secondary | ICD-10-CM

## 2022-12-02 ENCOUNTER — Encounter: Payer: Self-pay | Admitting: Adult Health

## 2022-12-02 ENCOUNTER — Ambulatory Visit (INDEPENDENT_AMBULATORY_CARE_PROVIDER_SITE_OTHER): Payer: Medicare Other | Admitting: Adult Health

## 2022-12-02 VITALS — BP 150/88 | HR 73 | Temp 98.0°F | Ht 60.24 in | Wt 175.3 lb

## 2022-12-02 DIAGNOSIS — L299 Pruritus, unspecified: Secondary | ICD-10-CM | POA: Diagnosis not present

## 2022-12-02 DIAGNOSIS — T466X5A Adverse effect of antihyperlipidemic and antiarteriosclerotic drugs, initial encounter: Secondary | ICD-10-CM

## 2022-12-02 DIAGNOSIS — M791 Myalgia, unspecified site: Secondary | ICD-10-CM

## 2022-12-02 DIAGNOSIS — I10 Essential (primary) hypertension: Secondary | ICD-10-CM | POA: Diagnosis not present

## 2022-12-02 DIAGNOSIS — G2581 Restless legs syndrome: Secondary | ICD-10-CM

## 2022-12-02 DIAGNOSIS — E118 Type 2 diabetes mellitus with unspecified complications: Secondary | ICD-10-CM

## 2022-12-02 DIAGNOSIS — E782 Mixed hyperlipidemia: Secondary | ICD-10-CM | POA: Diagnosis not present

## 2022-12-02 DIAGNOSIS — M1990 Unspecified osteoarthritis, unspecified site: Secondary | ICD-10-CM

## 2022-12-02 LAB — CBC WITH DIFFERENTIAL/PLATELET
Basophils Absolute: 0.1 10*3/uL (ref 0.0–0.1)
Basophils Relative: 0.7 % (ref 0.0–3.0)
Eosinophils Absolute: 0.6 10*3/uL (ref 0.0–0.7)
Eosinophils Relative: 7.6 % — ABNORMAL HIGH (ref 0.0–5.0)
HCT: 41.8 % (ref 36.0–46.0)
Hemoglobin: 14 g/dL (ref 12.0–15.0)
Lymphocytes Relative: 19.2 % (ref 12.0–46.0)
Lymphs Abs: 1.6 10*3/uL (ref 0.7–4.0)
MCHC: 33.4 g/dL (ref 30.0–36.0)
MCV: 97.9 fl (ref 78.0–100.0)
Monocytes Absolute: 0.7 10*3/uL (ref 0.1–1.0)
Monocytes Relative: 8.7 % (ref 3.0–12.0)
Neutro Abs: 5.4 10*3/uL (ref 1.4–7.7)
Neutrophils Relative %: 63.8 % (ref 43.0–77.0)
Platelets: 280 10*3/uL (ref 150.0–400.0)
RBC: 4.27 Mil/uL (ref 3.87–5.11)
RDW: 12.3 % (ref 11.5–15.5)
WBC: 8.4 10*3/uL (ref 4.0–10.5)

## 2022-12-02 LAB — COMPREHENSIVE METABOLIC PANEL
ALT: 26 U/L (ref 0–35)
AST: 19 U/L (ref 0–37)
Albumin: 4.2 g/dL (ref 3.5–5.2)
Alkaline Phosphatase: 123 U/L — ABNORMAL HIGH (ref 39–117)
BUN: 15 mg/dL (ref 6–23)
CO2: 29 mEq/L (ref 19–32)
Calcium: 10.7 mg/dL — ABNORMAL HIGH (ref 8.4–10.5)
Chloride: 99 mEq/L (ref 96–112)
Creatinine, Ser: 0.81 mg/dL (ref 0.40–1.20)
GFR: 67.53 mL/min (ref >60.00)
Glucose, Bld: 144 mg/dL — ABNORMAL HIGH (ref 70–99)
Potassium: 4.1 mEq/L (ref 3.5–5.1)
Sodium: 137 mEq/L (ref 135–145)
Total Bilirubin: 0.4 mg/dL (ref 0.2–1.2)
Total Protein: 7.8 g/dL (ref 6.0–8.3)

## 2022-12-02 LAB — LIPID PANEL
Cholesterol: 132 mg/dL (ref 0–200)
HDL: 53.3 mg/dL (ref >39.00)
LDL Cholesterol: 51 mg/dL (ref 0–99)
NonHDL: 78.27
Total CHOL/HDL Ratio: 2
Triglycerides: 136 mg/dL (ref 0.0–149.0)
VLDL: 27.2 mg/dL (ref 0.0–40.0)

## 2022-12-02 LAB — TSH: TSH: 1.35 u[IU]/mL (ref 0.35–5.50)

## 2022-12-02 LAB — MICROALBUMIN / CREATININE URINE RATIO
Creatinine,U: 49.7 mg/dL
Microalb Creat Ratio: 10.3 mg/g (ref 0.0–30.0)
Microalb, Ur: 5.1 mg/dL — ABNORMAL HIGH (ref 0.0–1.9)

## 2022-12-02 MED ORDER — SERTRALINE HCL 50 MG PO TABS: 50.0000 mg | ORAL_TABLET | Freq: Every day | ORAL | 3 refills | Status: DC

## 2022-12-02 NOTE — Patient Instructions (Addendum)
It was great seeing you today   We will follow up with you regarding your lab work   Please let me know if you need anything   I am going to increase your Zoloft to 50 mg - you can take two of what you have at home and then the new prescription will be for the 50 mg dose.   You can add Tylenol arthritis to your regimen

## 2022-12-02 NOTE — Progress Notes (Signed)
Subjective:    Patient ID: Ann Thompson, female    DOB: Feb 24, 1941, 82 y.o.   MRN: [id number]  HPI Patient presents for yearly preventative medicine examination. She is a pleasant 82 year old female who  has a past medical history of Arthritis, DM type 2 (diabetes mellitus, type 2) ([place]), GERD (gastroesophageal reflux disease), History of kidney stones, Hyperlipemia, Hypertension, Left leg pain, and Wears hearing aid in both ears.  Hypertension-currently prescribed Ziac 2.5-6.25 mg daily and Dyazide 37.5-25 mg daily.  She denies dizziness, lightheadedness, chest pain, or shortness of breath. She took her blood pressure just prior to arrival.   BP Readings from Last 3 Encounters:  12/02/22 (!) 150/88  10/30/22 120/80  07/23/22 138/80   Diabetes mellitus type 2-takes Januvia 50 mg daily and glipizide 2.5 mg daily.   She does not check her blood sugars at home.  Denies episodes of hypoglycemia Lab Results  Component Value Date   HGBA1C 7.2 (A) 04/01/2022   Restless leg syndrome-takes Mirapex 1 mg nightly.  Feels as though this works well for her, is able to sleep through the night.  Chronic lumbar radiculopathy-is seen by physical medicine and rehab.  Has had multiple steroid injections and ablation but continues to have significant pain.  Plan going forward is for her to have spinal stimulator placed but she is now going to wait on this until the spring.   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 She continues to itch but not like before she started Zoloft.   Osteoarthritis -multiple joints - mostly back and knees, and hips. She feels somewhat controlled but continues to have daily pain. Currently taking Cymbalta   All immunizations and health maintenance protocols were reviewed with the patient and needed orders were placed.  Appropriate screening laboratory values were ordered for the patient including screening of hyperlipidemia, renal function and hepatic function. If  indicated by BPH, a PSA was ordered.  Medication reconciliation,  past medical history, social history, problem list and allergies were reviewed in detail with the patient  Goals were established with regard to weight loss, exercise, and  diet in compliance with medications. She is doing water aerobics and enjoys this.  Wt Readings from Last 3 Encounters:  12/02/22 175 lb 4.8 oz (79.5 kg)  10/30/22 175 lb (79.4 kg)  07/23/22 174 lb (78.9 kg)   Review of Systems  Constitutional: Negative.   HENT: Negative.    Eyes: Negative.   Respiratory: Negative.    Cardiovascular: Negative.   Gastrointestinal: Negative.   Endocrine: Negative.   Genitourinary: Negative.   Musculoskeletal:  Positive for arthralgias and back pain.  Skin: Negative.   Allergic/Immunologic: Negative.   Neurological: Negative.   Hematological: Negative.   Psychiatric/Behavioral: Negative.     Past Medical History:  Diagnosis Date   Arthritis    DM type 2 (diabetes mellitus, type 2) (HCC)    GERD (gastroesophageal reflux disease)    History of kidney stones    Hyperlipemia    Hypertension    Left leg pain    Wears hearing aid in both ears     Social History   Socioeconomic History   Marital status: Married    Spouse name: Not on file   Number of children: Not on file   Years of education: Not on file   Highest education level: GED or equivalent  Occupational History   Not on file  Tobacco Use   Smoking status: Former  Packs/day: 1.00    Years: 25.00    Additional pack years: 0.00    Total pack years: 25.00    Types: Cigarettes    Quit date: 09/08/1981    Years since quitting: 41.2   Smokeless tobacco: Never  Vaping Use   Vaping Use: Never used  Substance and Sexual Activity   Alcohol use: Never   Drug use: Never   Sexual activity: Not on file  Other Topics Concern   Not on file  Social History Narrative   Not on file   Social Determinants of Health   Financial Resource Strain: Low  Risk  (06/27/2022)   Overall Financial Resource Strain (CARDIA)    Difficulty of Paying Living Expenses: Not hard at all  Food Insecurity: No Food Insecurity (06/27/2022)   Hunger Vital Sign    Worried About Running Out of Food in the Last Year: Never true    Ran Out of Food in the Last Year: Never true  Transportation Needs: No Transportation Needs (06/27/2022)   PRAPARE - Hydrologist (Medical): No    Lack of Transportation (Non-Medical): No  Physical Activity: Insufficiently Active (06/27/2022)   Exercise Vital Sign    Days of Exercise per Week: 1 day    Minutes of Exercise per Session: 60 min  Stress: No Stress Concern Present (06/27/2022)   [place]    Feeling of Stress : Not at all  Social Connections: Moderately Integrated (06/27/2022)   Social Connection and Isolation Panel [NHANES]    Frequency of Communication with Friends and Family: More than three times a week    Frequency of Social Gatherings with Friends and Family: More than three times a week    Attends Religious Services: 1 to 4 times per year    Active Member of [employer] or Organizations: No    Attends Archivist Meetings: Not on file    Marital Status: Married  Intimate Partner Violence: Not At Risk (01/30/2021)   Humiliation, Afraid, Rape, and Kick questionnaire    Fear of Current or Ex-Partner: No    Emotionally Abused: No    Physically Abused: No    Sexually Abused: No    Past Surgical History:  Procedure Laterality Date   ABDOMINAL HYSTERECTOMY  1982   partial   [place]  07/12/2020   lower [facility]   [place] Right 10/06/2020   Procedure: [place];  Surgeon: Alexis Frock, MD;  Location: [facility];  Service: Urology;  Laterality: Right;   CYSTOSCOPY WITH RETROGRADE PYELOGRAM,  URETEROSCOPY AND STENT PLACEMENT Right 10/26/2020   Procedure: CYSTOSCOPY WITH RETROGRADE PYELOGRAM, URETEROSCOPY AND STENT REPLACEMENT;  Surgeon: Alexis Frock, MD;  Location: [facility];  Service: Urology;  Laterality: Right;  90 MINS   HOLMIUM LASER APPLICATION Right [id number]   Procedure: HOLMIUM LASER APPLICATION;  Surgeon: Alexis Frock, MD;  Location: [facility];  Service: Urology;  Laterality: Right;   JOINT REPLACEMENT  2016   both knees partial replacement    Family History  Family history unknown: Yes    Allergies  Allergen Reactions   Diclofenac Swelling    1Suspected to have caused hives 2015-- had allergy testing  1Suspected to have caused hives 2015-- had allergy testing  Tongue swelling  1Suspected to have caused hives 2015-- had allergy testing  1Suspected to have caused hives 2015-- had allergy testing                                                    Tongue swelling   Simvastatin     Mylagia    Metformin Other (See Comments)    Current Outpatient Medications on File Prior to Visit  Medication Sig Dispense Refill   acetaminophen (TYLENOL) 325 MG tablet      bisoprolol-hydrochlorothiazide (ZIAC) 2.5-6.25 MG tablet TAKE 1 TABLET BY MOUTH EVERY DAY 90 tablet 3   celecoxib (CELEBREX) 200 MG capsule TAKE 1 CAPSULE BY MOUTH EVERY DAY 90 capsule 1   cycloSPORINE (RESTASIS) 0.05 % ophthalmic emulsion      FLAREX 0.1 % ophthalmic suspension Apply to eye.     fluticasone (FLONASE) 50 MCG/ACT nasal spray USE 2 SPRAYS IN EACH NOSTRIL EVERY DAY 16 g 5   glipiZIDE (GLUCOTROL XL) 2.5 MG 24 hr tablet TAKE 1 TABLET BY MOUTH EVERY DAY WITH BREAKFAST 90 tablet 0   Glucosamine-Chondroit-Vit C-Mn (GLUCOSAMINE 1500 COMPLEX) CAPS Take 1 capsule by mouth daily.     JANUVIA 50 MG tablet TAKE 1 TABLET BY MOUTH EVERY DAY 90 tablet 0   Multiple Vitamins-Minerals (PRESERVISION AREDS PO) Take 1 tablet by  mouth daily.     Polyvinyl Alcohol-Povidone PF (REFRESH) 1.4-0.6 % SOLN      Potassium 75 MG TABS potassium     pramipexole (MIRAPEX) 1.5 MG tablet TAKE 1 TABLET BY MOUTH AT BEDTIME 90 tablet 1   Tetrahydrozoline-Zn Sulfate (EYE DROPS A/C OP) Apply 1 drop to eye daily as needed (dry eyes).     triamcinolone cream (KENALOG) 0.1 % 1 application     triamterene-hydrochlorothiazide (MAXZIDE-25) 37.5-25 MG tablet TAKE 1 TABLET BY MOUTH EVERY DAY 90 tablet 0   UNABLE TO FIND Med Name: cbd oil to knees prn     vitamin B-12 (CYANOCOBALAMIN) 100 MCG tablet Vitamin B12     No current facility-administered medications on file prior to visit.    BP (!) 150/88 (BP Location: Left Arm, Patient Position: Sitting, Cuff Size: Normal)   Pulse 73   Temp 98 F (36.7 C) (Oral)   Ht 5' 0.24" (1.53 m)   Wt 175 lb 4.8 oz (79.5 kg)   SpO2 97%   BMI 33.97 kg/m       Objective:   Physical Exam Vitals and nursing note reviewed.  Constitutional:      General: She is not in acute distress.    Appearance: Normal appearance. She is well-developed. She is obese. She is not ill-appearing.  HENT:     Head: Normocephalic and atraumatic.     Right Ear: Tympanic membrane, ear canal and external ear normal. There is no impacted cerumen.     Left Ear: Tympanic membrane, ear canal and external ear normal. There is no impacted cerumen.     Nose: Nose normal. No congestion or rhinorrhea.     Mouth/Throat:     Mouth: Mucous membranes are moist.     Pharynx: Oropharynx is clear. No oropharyngeal exudate or posterior oropharyngeal erythema.  Eyes:     General:        Right eye: No discharge.        Left eye: No  discharge.     Extraocular Movements: Extraocular movements intact.     Conjunctiva/sclera: Conjunctivae normal.     Pupils: Pupils are equal, round, and reactive to light.  Neck:     Thyroid: No thyromegaly.     Vascular: No carotid bruit.     Trachea: No tracheal deviation.  Cardiovascular:     Rate and  Rhythm: Normal rate and regular rhythm.     Pulses: Normal pulses.     Heart sounds: Normal heart sounds. No murmur heard.    No friction rub. No gallop.  Pulmonary:     Effort: Pulmonary effort is normal. No respiratory distress.     Breath sounds: Normal breath sounds. No stridor. No wheezing, rhonchi or rales.  Chest:     Chest wall: No tenderness.  Abdominal:     General: Abdomen is flat. Bowel sounds are normal. There is no distension.     Palpations: Abdomen is soft. There is no mass.     Tenderness: There is no abdominal tenderness. There is no right CVA tenderness, left CVA tenderness, guarding or rebound.     Hernia: No hernia is present.  Musculoskeletal:        General: No swelling, tenderness, deformity or signs of injury. Normal range of motion.     Cervical back: Normal range of motion and neck supple.     Right lower leg: No edema.     Left lower leg: No edema.  Lymphadenopathy:     Cervical: No cervical adenopathy.  Skin:    General: Skin is warm and dry.     Capillary Refill: Capillary refill takes less than 2 seconds.     Coloration: Skin is not jaundiced or pale.     Findings: No bruising, erythema, lesion or rash.  Neurological:     General: No focal deficit present.     Mental Status: She is alert and oriented to person, place, and time.     Cranial Nerves: No cranial nerve deficit.     Sensory: No sensory deficit.     Motor: No weakness.     Coordination: Coordination normal.     Gait: Gait normal.     Deep Tendon Reflexes: Reflexes normal.  Psychiatric:        Mood and Affect: Mood normal.        Behavior: Behavior normal.        Thought Content: Thought content normal.        Judgment: Judgment normal.        Assessment & Plan:  1. Controlled type 2 diabetes mellitus with complication, without long-term current use of insulin (HCC) - Consider increase in glipizide - Follow up in 3 months  - Continue to exercise and eat healthy  - CBC with  Differential/Platelet; Future - Comprehensive metabolic panel; Future - Lipid panel; Future - TSH; Future - Microalbumin/Creatinine Ratio, Urine; Future  2. Chronic pruritus - Will increase Zoloft to 50 mg daily  - CBC with Differential/Platelet; Future - Comprehensive metabolic panel; Future - Lipid panel; Future - TSH; Future  3. Primary hypertension - Elevated today but at goal in the past. Continue with current therapy   - CBC with Differential/Platelet; Future - Comprehensive metabolic panel; Future - Lipid panel; Future - TSH; Future - Microalbumin/Creatinine Ratio, Urine; Future  4. Mixed hyperlipidemia  - CBC with Differential/Platelet; Future - Comprehensive metabolic panel; Future - Lipid panel; Future - TSH; Future  5. Myalgia due to statin - Consider zetia or  fenofibrate   6. Chronic osteoarthritis - Can try adding Tylenol or voltaren gel to regimen. Continue with Celebrex 200 mg daily.  - Continue with water aerobics   7. Restless leg syndrome - Continue with Mirapex    Dorothyann Peng, NP

## 2022-12-03 ENCOUNTER — Other Ambulatory Visit (INDEPENDENT_AMBULATORY_CARE_PROVIDER_SITE_OTHER): Payer: Medicare Other

## 2022-12-03 ENCOUNTER — Other Ambulatory Visit: Payer: Self-pay | Admitting: Adult Health

## 2022-12-03 DIAGNOSIS — E118 Type 2 diabetes mellitus with unspecified complications: Secondary | ICD-10-CM

## 2022-12-03 LAB — HEMOGLOBIN A1C: Hgb A1c MFr Bld: 7.7 % — ABNORMAL HIGH (ref 4.6–6.5)

## 2022-12-15 ENCOUNTER — Other Ambulatory Visit: Payer: Self-pay | Admitting: Adult Health

## 2022-12-15 DIAGNOSIS — G2581 Restless legs syndrome: Secondary | ICD-10-CM

## 2022-12-17 NOTE — Telephone Encounter (Signed)
OK to fill

## 2022-12-17 NOTE — Telephone Encounter (Signed)
Allergy flgging for both requested medications  High Allergy/Contraindication: celecoxibReactions: Swelling. No reaction type specified. User documented allergy severity: High. Cross-sensitive Class Match with DICLOFENAC (Class: NSAIDS). "1Suspected to have caused hives 2015-- had allergy testing 1Suspected to have caused hives 2015-- had allergy testing                                                   Tongue swelling 1Suspected to have caused hives 2015-- had allergy testing 1Suspected to have caused hives 2015-- had allergy testing                                                   Tongue swelling" Details Override reason     celecoxib (CELEBREX) 200 MG capsule [Pharmacy Med Name: celecoxib 200 mg capsule] Prescription. Reordered. High High Dose: pramipexole, 1.5 mg, Oral, Daily at bedtimeSingle dose of 1.5 mg exceeds recommended maximum of 0.75 mg by 100% Daily dose of 1.5 mg exceeds recommended maximum of 0.75 mg by 100% Details Override reason     pramipexole (MIRAPEX) 1.5 MG tablet [Pharmacy Med Name: pramipexole 1.5 mg tablet] Prescription. Reordered. Long-term.

## 2022-12-23 ENCOUNTER — Ambulatory Visit (INDEPENDENT_AMBULATORY_CARE_PROVIDER_SITE_OTHER): Payer: Medicare Other | Admitting: Adult Health

## 2022-12-23 ENCOUNTER — Encounter: Payer: Self-pay | Admitting: Adult Health

## 2022-12-23 VITALS — BP 160/70 | HR 80 | Temp 98.3°F | Ht 60.02 in | Wt 173.0 lb

## 2022-12-23 DIAGNOSIS — I10 Essential (primary) hypertension: Secondary | ICD-10-CM

## 2022-12-23 DIAGNOSIS — G2581 Restless legs syndrome: Secondary | ICD-10-CM

## 2022-12-23 DIAGNOSIS — J0110 Acute frontal sinusitis, unspecified: Secondary | ICD-10-CM | POA: Diagnosis not present

## 2022-12-23 MED ORDER — DOXYCYCLINE HYCLATE 100 MG PO CAPS: 100.0000 mg | ORAL_CAPSULE | Freq: Two times a day (BID) | ORAL | 0 refills | Status: DC

## 2022-12-23 MED ORDER — PRAMIPEXOLE DIHYDROCHLORIDE 1 MG PO TABS: 2.0000 mg | ORAL_TABLET | Freq: Every day | ORAL | 1 refills | Status: DC

## 2022-12-23 NOTE — Progress Notes (Addendum)
Subjective:    Patient ID: Ann Thompson, female    DOB: 03/01/1941, 82 y.o.   MRN: [id number]  Sinusitis This is a new problem. The current episode started in the past 7 days. There has been no fever. Associated symptoms include congestion, coughing, headaches and sinus pressure. Pertinent negatives include no chills, ear pain, shortness of breath or sore throat. Past treatments include nothing.   Restless leg syndrome - she takes mirapax 1.5 m QHS. She has found that this is no longer working for her. She has been breaking up her medication and taking 2 mg nightly on her own and has found that this resolves her symptoms.   Review of Systems  Constitutional:  Negative for chills.  HENT:  Positive for congestion and sinus pressure. Negative for ear pain and sore throat.   Respiratory:  Positive for cough. Negative for shortness of breath.   Neurological:  Positive for headaches.   Past Medical History:  Diagnosis Date   Arthritis    DM type 2 (diabetes mellitus, type 2)    GERD (gastroesophageal reflux disease)    History of kidney stones    Hyperlipemia    Hypertension    Left leg pain    Wears hearing aid in both ears     Social History   Socioeconomic History   Marital status: Married    Spouse name: Not on file   Number of children: Not on file   Years of education: Not on file   Highest education level: GED or equivalent  Occupational History   Not on file  Tobacco Use   Smoking status: Former    Packs/day: 1.00    Years: 25.00    Additional pack years: 0.00    Total pack years: 25.00    Types: Cigarettes    Quit date: 09/08/1981    Years since quitting: 41.3   Smokeless tobacco: Never  Vaping Use   Vaping Use: Never used  Substance and Sexual Activity   Alcohol use: Never   Drug use: Never   Sexual activity: Not on file  Other Topics Concern   Not on file  Social History Narrative   Not on file   Social Determinants of Health   Financial Resource  Strain: Low Risk  (06/27/2022)   Overall Financial Resource Strain (CARDIA)    Difficulty of Paying Living Expenses: Not hard at all  Food Insecurity: No Food Insecurity (06/27/2022)   Hunger Vital Sign    Worried About Running Out of Food in the Last Year: Never true    Ran Out of Food in the Last Year: Never true  Transportation Needs: No Transportation Needs (06/27/2022)   PRAPARE - Administrator, Civil Service (Medical): No    Lack of Transportation (Non-Medical): No  Physical Activity: Insufficiently Active (06/27/2022)   Exercise Vital Sign    Days of Exercise per Week: 1 day    Minutes of Exercise per Session: 60 min  Stress: No Stress Concern Present (06/27/2022)   [employer] of Occupational Health - Occupational Stress Questionnaire    Feeling of Stress : Not at all  Social Connections: Moderately Integrated (06/27/2022)   Social Connection and Isolation Panel [NHANES]    Frequency of Communication with Friends and Family: More than three times a week    Frequency of Social Gatherings with Friends and Family: More than three times a week    Attends Religious Services: 1 to 4 times per year  Active Member of Clubs or Organizations: No    Attends Banker Meetings: Not on file    Marital Status: Married  Intimate Partner Violence: Not At Risk (01/30/2021)   Humiliation, Afraid, Rape, and Kick questionnaire    Fear of Current or Ex-Partner: No    Emotionally Abused: No    Physically Abused: No    Sexually Abused: No    Past Surgical History:  Procedure Laterality Date   ABDOMINAL HYSTERECTOMY  1982   partial   APPENDECTOMY  1957   BACK SURGERY  07/12/2020   lower [facility]   CYSTOSCOPY W/ URETERAL STENT PLACEMENT Right 10/06/2020   Procedure: CYSTOSCOPY WITH RETROGRADE PYELOGRAM/URETERAL STENT PLACEMENT;  Surgeon: Sebastian Ache, MD;  Location[facility];  Service: Urology;  Laterality: Right;   CYSTOSCOPY WITH RETROGRADE  PYELOGRAM, URETEROSCOPY AND STENT PLACEMENT Right 10/26/2020   Procedure: CYSTOSCOPY WITH RETROGRADE PYELOGRAM, URETEROSCOPY AND STENT REPLACEMENT;  Surgeon: Sebastian Ache, MD;  Location[facility];  Service: Urology;  Laterality: Right;  90 MINS   HOLMIUM LASER APPLICATION Right 10/26/2020   Procedure: HOLMIUM LASER APPLICATION;  Surgeon: Sebastian Ache, MD;  Location: [facility];  Service: Urology;  Laterality: Right;   JOINT REPLACEMENT  2016   both knees partial replacement    Family History  Family history unknown: Yes    Allergies  Allergen Reactions   Diclofenac Swelling    1Suspected to have caused hives 2015-- had allergy testing  1Suspected to have caused hives 2015-- had allergy testing                                                    Tongue swelling  1Suspected to have caused hives 2015-- had allergy testing  1Suspected to have caused hives 2015-- had allergy testing                                                    Tongue swelling   Simvastatin     Mylagia    Metformin Other (See Comments)    Current Outpatient Medications on File Prior to Visit  Medication Sig Dispense Refill   acetaminophen (TYLENOL) 325 MG tablet      bisoprolol-hydrochlorothiazide (ZIAC) 2.5-6.25 MG tablet TAKE 1 TABLET BY MOUTH EVERY DAY 90 tablet 3   celecoxib (CELEBREX) 200 MG capsule TAKE 1 CAPSULE BY MOUTH EVERY DAY 90 capsule 1   cycloSPORINE (RESTASIS) 0.05 % ophthalmic emulsion      FLAREX 0.1 % ophthalmic suspension Apply to eye.     fluticasone (FLONASE) 50 MCG/ACT nasal spray USE 2 SPRAYS IN EACH NOSTRIL EVERY DAY 16 g 5   glipiZIDE (GLUCOTROL XL) 2.5 MG 24 hr tablet TAKE 1 TABLET BY MOUTH EVERY DAY WITH BREAKFAST 90 tablet 0   Glucosamine-Chondroit-Vit C-Mn (GLUCOSAMINE 1500 COMPLEX) CAPS Take 1 capsule by mouth daily.     JANUVIA 50 MG tablet TAKE 1 TABLET BY MOUTH EVERY DAY 90 tablet 0   Multiple Vitamins-Minerals (PRESERVISION AREDS PO) Take  1 tablet by mouth daily.     Polyvinyl Alcohol-Povidone PF (REFRESH) 1.4-0.6 % SOLN      Potassium 75 MG TABS potassium  pramipexole (MIRAPEX) 1.5 MG tablet TAKE 1 TABLET BY MOUTH AT BEDTIME 90 tablet 1   sertraline (ZOLOFT) 50 MG tablet TAKE 1 TABLET BY MOUTH EVERY DAY 90 tablet 1   Tetrahydrozoline-Zn Sulfate (EYE DROPS A/C OP) Apply 1 drop to eye daily as needed (dry eyes).     triamcinolone cream (KENALOG) 0.1 % 1 application     triamterene-hydrochlorothiazide (MAXZIDE-25) 37.5-25 MG tablet TAKE 1 TABLET BY MOUTH EVERY DAY 90 tablet 3   UNABLE TO FIND Med Name: cbd oil to knees prn     vitamin B-12 (CYANOCOBALAMIN) 100 MCG tablet Vitamin B12     No current facility-administered medications on file prior to visit.    BP (!) 160/70   Pulse 80   Temp 98.3 F (36.8 C) (Oral)   Ht 5' 0.02" (1.525 m)   Wt 173 lb (78.5 kg)   SpO2 94%   BMI 33.76 kg/m       Objective:   Physical Exam Vitals and nursing note reviewed.  Constitutional:      Appearance: Normal appearance.  HENT:     Right Ear: Tympanic membrane, ear canal and external ear normal.     Left Ear: Tympanic membrane, ear canal and external ear normal.     Nose: Congestion and rhinorrhea present. Rhinorrhea is purulent.     Right Turbinates: Enlarged and swollen.     Left Turbinates: Enlarged and swollen.     Right Sinus: Maxillary sinus tenderness present.     Left Sinus: Maxillary sinus tenderness present.     Mouth/Throat:     Mouth: Mucous membranes are moist.     Pharynx: Oropharynx is clear.  Cardiovascular:     Rate and Rhythm: Normal rate and regular rhythm.     Pulses: Normal pulses.     Heart sounds: Normal heart sounds.  Pulmonary:     Effort: Pulmonary effort is normal.     Breath sounds: Normal breath sounds.  Skin:    General: Skin is warm and dry.  Neurological:     General: No focal deficit present.     Mental Status: She is alert and oriented to person, place, and time.  Psychiatric:         Mood and Affect: Mood normal.        Behavior: Behavior normal.        Thought Content: Thought content normal.        Judgment: Judgment normal.       Assessment & Plan:   1. Acute non-recurrent frontal sinusitis  - doxycycline (VIBRAMYCIN) 100 MG capsule; Take 1 capsule (100 mg total) by mouth 2 (two) times daily.  Dispense: 14 capsule; Refill: 0  2. Restless leg syndrome  - pramipexole (MIRAPEX) 1 MG tablet; Take 2 tablets (2 mg total) by mouth at bedtime.  Dispense: 180 tablet; Refill: 1   3. Primary hypertension - She did not take her BP medications today  - encouraged to take daily as directed   [name], NP

## 2022-12-23 NOTE — Addendum Note (Signed)
Addended by: Nancy Fetter on: 12/23/2022 04:25 PM   Modules accepted: Level of Service

## 2022-12-25 ENCOUNTER — Other Ambulatory Visit: Payer: Self-pay | Admitting: Adult Health

## 2022-12-25 DIAGNOSIS — E118 Type 2 diabetes mellitus with unspecified complications: Secondary | ICD-10-CM

## 2022-12-31 ENCOUNTER — Other Ambulatory Visit: Payer: Self-pay | Admitting: Adult Health

## 2022-12-31 DIAGNOSIS — J302 Other seasonal allergic rhinitis: Secondary | ICD-10-CM

## 2023-01-14 ENCOUNTER — Telehealth: Payer: Self-pay

## 2023-01-14 ENCOUNTER — Other Ambulatory Visit: Payer: Self-pay | Admitting: Adult Health

## 2023-01-14 DIAGNOSIS — J329 Chronic sinusitis, unspecified: Secondary | ICD-10-CM

## 2023-01-14 NOTE — Telephone Encounter (Signed)
Patient notified of update  and verbalized understanding. 

## 2023-01-14 NOTE — Telephone Encounter (Signed)
  My ears constantly feel stopped up and hurt, have headaches in my forehead. Have not been checked by a ENT Dr. ever. Britta Mccreedy Butrum    You  Haywood Filler Delk3 hours ago (7:50 AM)    Hello,   What's going on with [place]? Also, in the future can you please send messages for her in her own Mychart. This way we can keep up with her records and what's going on with her.     Haywood Filler Nyman  P [facility] Clinical Pool (supporting [name], NP)14 hours ago (8:29 PM)   AD Kiralyn needs to see an E N T. You referred me to one in the past but I can't remember his name. Who do you recommend?  Thanks Koleen Nimrod Kijowski

## 2023-01-14 NOTE — Telephone Encounter (Signed)
Called pt to explain that an ov is needed. However, pt was at an appt. And could not talk. Pt advised to return call.

## 2023-01-14 NOTE — Telephone Encounter (Signed)
Pt stated that she has a hx of sinus issues. I advised that I would send to [facility]. PT informed that I would call back after [facility] responds. Pt verbalized understanding.

## 2023-02-10 ENCOUNTER — Encounter: Payer: Medicare Other | Admitting: Family Medicine

## 2023-02-10 NOTE — Progress Notes (Deleted)
PATIENT CHECK-IN and HEALTH RISK ASSESSMENT QUESTIONNAIRE:  -completed by phone/video for upcoming Medicare Preventive Visit  Pre-Visit Check-in: 1)Vitals (height, wt, BP, etc) - record in vitals section for visit on day of visit 2)Review and Update Medications, Allergies PMH, Surgeries, Social history in Epic 3)Hospitalizations in the last year with date/reason? ***  4)Review and Update Care Team (patient's specialists) in Epic 5) Complete PHQ9 in Epic  6) Complete Fall Screening in Epic 7)Review all Health Maintenance Due and order under PCP if not done.  8)Medicare Wellness Questionnaire: Answer theses question about your habits: Do you drink alcohol? *** If yes, how many drinks do you have a day?*** Have you ever smoked?*** Quit date if applicable? ***  How many packs a day do/did you smoke? *** Do you use smokeless tobacco?*** Do you use an illicit drugs?*** Do you exercises? ***IF so, what type and how many days/minutes per week?*** Are you sexually active? ***Number of partners?*** Typical breakfast**** Typical lunch*** Typical dinner*** Typical snacks:****  Beverages: ***  Answer theses question about you: Can you perform most household chores?*** Do you find it hard to follow a conversation in a noisy room?*** Do you often ask people to speak up or repeat themselves?*** Do you feel that you have a problem with memory?*** Do you balance your checkbook and or bank acounts?*** Do you feel safe at home?*** Last dentist visit?*** Do you need assistance with any of the following: Please note if so ***  Driving?  Feeding yourself?  Getting from bed to chair?  Getting to the toilet?  Bathing or showering?  Dressing yourself?  Managing money?  Climbing a flight of stairs  Preparing meals?  Do you have Advanced Directives in place (Living Will, Healthcare Power or Attorney)? ***   Last eye Exam and location?***   Do you currently use prescribed or non-prescribed  narcotic or opioid pain medications?***  Do you have a history or close family history of breast, ovarian, tubal or peritoneal cancer or a family member with BRCA (breast cancer susceptibility 1 and 2) gene mutations?  Nurse/Assistant Credentials/time stamp:   ----------------------------------------------------------------------------------------------------------------------------------------------------------------------------------------------------------------------   MEDICARE ANNUAL PREVENTIVE VISIT WITH PROVIDER: (Welcome to [employer], initial annual wellness or annual wellness exam)  Virtual Visit via Video***Phone Note  I connected with Ann Thompson on 02/10/23 by phone *** a video enabled telemedicine application and verified that I am speaking with the correct person using two identifiers.  Location patient: home Location provider:work or home office Persons participating in the virtual visit: patient, provider  Concerns and/or follow up today:   See HM section in Epic for other details of completed HM.    ROS: negative for report of fevers, unintentional weight loss, vision changes, vision loss, hearing loss or change, chest pain, sob, hemoptysis, melena, hematochezia, hematuria, falls, bleeding or bruising, thoughts of suicide or self harm, memory loss  Patient-completed extensive health risk assessment - reviewed and discussed with the patient: See Health Risk Assessment completed with patient prior to the visit either above or in recent phone note. This was reviewed in detailed with the patient today and appropriate recommendations, orders and referrals were placed as needed per Summary below and patient instructions.   Review of Medical History: -PMH, PSH, Family History and current specialty and care providers reviewed and updated and listed below   Patient Care Team: [name], NP as PCP - General (Family Medicine)   Past Medical History:  Diagnosis Date    Arthritis    DM type 2 (  diabetes mellitus, type 2) (HCC)    GERD (gastroesophageal reflux disease)    History of kidney stones    Hyperlipemia    Hypertension    Left leg pain    Wears hearing aid in both ears     Past Surgical History:  Procedure Laterality Date   ABDOMINAL HYSTERECTOMY  1982   partial   APPENDECTOMY  1957   BACK SURGERY  07/12/2020   lower at [facility]   CYSTOSCOPY W/ URETERAL STENT PLACEMENT Right 10/06/2020   Procedure: CYSTOSCOPY WITH RETROGRADE PYELOGRAM/URETERAL STENT PLACEMENT;  Surgeon: Sebastian Ache, MD;  Location: [facility] OR;  Service: Urology;  Laterality: Right;   CYSTOSCOPY WITH RETROGRADE PYELOGRAM, URETEROSCOPY AND STENT PLACEMENT Right 10/26/2020   Procedure: CYSTOSCOPY WITH RETROGRADE PYELOGRAM, URETEROSCOPY AND STENT REPLACEMENT;  Surgeon: Sebastian Ache, MD;  Location: [facility];  Service: Urology;  Laterality: Right;  90 MINS   HOLMIUM LASER APPLICATION Right 10/26/2020   Procedure: HOLMIUM LASER APPLICATION;  Surgeon: Sebastian Ache, MD;  Location: [facility];  Service: Urology;  Laterality: Right;   JOINT REPLACEMENT  2016   both knees partial replacement    Social History   Socioeconomic History   Marital status: Married    Spouse name: Not on file   Number of children: Not on file   Years of education: Not on file   Highest education level: GED or equivalent  Occupational History   Not on file  Tobacco Use   Smoking status: Former    Packs/day: 1.00    Years: 25.00    Additional pack years: 0.00    Total pack years: 25.00    Types: Cigarettes    Quit date: 09/08/1981    Years since quitting: 41.4   Smokeless tobacco: Never  Vaping Use   Vaping Use: Never used  Substance and Sexual Activity   Alcohol use: Never   Drug use: Never   Sexual activity: Not on file  Other Topics Concern   Not on file  Social History Narrative   Not on file   Social Determinants of Health   Financial  Resource Strain: Low Risk  (06/27/2022)   Overall Financial Resource Strain (CARDIA)    Difficulty of Paying Living Expenses: Not hard at all  Food Insecurity: No Food Insecurity (06/27/2022)   Hunger Vital Sign    Worried About Running Out of Food in the Last Year: Never true    Ran Out of Food in the Last Year: Never true  Transportation Needs: No Transportation Needs (06/27/2022)   PRAPARE - Administrator, Civil Service (Medical): No    Lack of Transportation (Non-Medical): No  Physical Activity: Insufficiently Active (06/27/2022)   Exercise Vital Sign    Days of Exercise per Week: 1 day    Minutes of Exercise per Session: 60 min  Stress: No Stress Concern Present (06/27/2022)   [employer] of Occupational Health - Occupational Stress Questionnaire    Feeling of Stress : Not at all  Social Connections: Moderately Integrated (06/27/2022)   Social Connection and Isolation Panel [NHANES]    Frequency of Communication with Friends and Family: More than three times a week    Frequency of Social Gatherings with Friends and Family: More than three times a week    Attends Religious Services: 1 to 4 times per year    Active Member of [employer] or Organizations: No    Attends Banker Meetings: Not on file  Marital Status: Married  Catering manager Violence: Not At Risk (01/30/2021)   Humiliation, Afraid, Rape, and Kick questionnaire    Fear of Current or Ex-Partner: No    Emotionally Abused: No    Physically Abused: No    Sexually Abused: No    Family History  Family history unknown: Yes    Current Outpatient Medications on File Prior to Visit  Medication Sig Dispense Refill   acetaminophen (TYLENOL) 325 MG tablet      bisoprolol-hydrochlorothiazide (ZIAC) 2.5-6.25 MG tablet TAKE 1 TABLET BY MOUTH EVERY DAY 90 tablet 3   celecoxib (CELEBREX) 200 MG capsule TAKE 1 CAPSULE BY MOUTH EVERY DAY 90 capsule 1   cycloSPORINE (RESTASIS) 0.05 % ophthalmic  emulsion      doxycycline (VIBRAMYCIN) 100 MG capsule Take 1 capsule (100 mg total) by mouth 2 (two) times daily. 14 capsule 0   FLAREX 0.1 % ophthalmic suspension Apply to eye.     fluticasone (FLONASE) 50 MCG/ACT nasal spray USE 2 SPRAYS IN EACH NOSTRIL EVERY DAY 16 g 5   glipiZIDE (GLUCOTROL XL) 2.5 MG 24 hr tablet TAKE 1 TABLET BY MOUTH EVERY DAY WITH breakfast 90 tablet 0   Glucosamine-Chondroit-Vit C-Mn (GLUCOSAMINE 1500 COMPLEX) CAPS Take 1 capsule by mouth daily.     JANUVIA 50 MG tablet TAKE 1 TABLET BY MOUTH EVERY DAY 90 tablet 0   Multiple Vitamins-Minerals (PRESERVISION AREDS PO) Take 1 tablet by mouth daily.     Polyvinyl Alcohol-Povidone PF (REFRESH) 1.4-0.6 % SOLN      Potassium 75 MG TABS potassium     pramipexole (MIRAPEX) 1 MG tablet Take 2 tablets (2 mg total) by mouth at bedtime. 180 tablet 1   sertraline (ZOLOFT) 50 MG tablet TAKE 1 TABLET BY MOUTH EVERY DAY 90 tablet 1   Tetrahydrozoline-Zn Sulfate (EYE DROPS A/C OP) Apply 1 drop to eye daily as needed (dry eyes).     triamcinolone cream (KENALOG) 0.1 % 1 application     triamterene-hydrochlorothiazide (MAXZIDE-25) 37.5-25 MG tablet TAKE 1 TABLET BY MOUTH EVERY DAY 90 tablet 3   UNABLE TO FIND Med Name: cbd oil to knees prn     vitamin B-12 (CYANOCOBALAMIN) 100 MCG tablet Vitamin B12     No current facility-administered medications on file prior to visit.    Allergies  Allergen Reactions   Diclofenac Swelling    1Suspected to have caused hives 2015-- had allergy testing  1Suspected to have caused hives 2015-- had allergy testing                                                    Tongue swelling  1Suspected to have caused hives 2015-- had allergy testing  1Suspected to have caused hives 2015-- had allergy testing                                                    Tongue swelling   Simvastatin     Mylagia    Metformin Other (See Comments)       Physical Exam There were no vitals filed for this  visit. Estimated body mass index is 33.76 kg/m as calculated from the following:   Height as  of 12/23/22: 5' 0.02" (1.525 m).   Weight as of 12/23/22: 173 lb (78.5 kg).  EKG (optional): deferred due to virtual visit  GENERAL: alert, oriented, no acute distress detected, full vision exam deferred due to pandemic and/or virtual encounter  *** HEENT: atraumatic, conjunttiva clear, no obvious abnormalities on inspection of external nose and ears  NECK: normal movements of the head and neck  LUNGS: on inspection no signs of respiratory distress, breathing rate appears normal, no obvious gross SOB, gasping or wheezing  CV: no obvious cyanosis  MS: moves all visible extremities without noticeable abnormality  PSYCH/NEURO: pleasant and cooperative, no obvious depression or anxiety, speech and thought processing grossly intact, Cognitive function grossly intact  Flowsheet Row Office Visit from 09/25/2021 in [facility] [place]  PHQ-9 Total Score 0           02/04/2022   11:54 AM 09/25/2021   10:16 AM 01/30/2021    2:11 PM 09/21/2020    1:09 PM  Depression screen PHQ 2/9  Decreased Interest 0 0 0 0  Down, Depressed, Hopeless 0 0 0 0  PHQ - 2 Score 0 0 0 0  Altered sleeping  0    Tired, decreased energy  0    Change in appetite  0    Feeling bad or failure about yourself   0    Trouble concentrating  0    Moving slowly or fidgety/restless  0    Suicidal thoughts  0    PHQ-9 Score  0    Difficult doing work/chores  Not difficult at all         10/26/2020   10:00 AM 01/30/2021    2:10 PM 09/25/2021   10:16 AM 02/04/2022   11:54 AM 06/27/2022   11:54 AM  Fall Risk  Falls in the past year?  0 0 0 0  Was there an injury with Fall?  0 0 0   Fall Risk Category Calculator  0 0 0   Fall Risk Category (Retired)  Low Low Low   (RETIRED) Patient Fall Risk Level High fall risk Moderate fall risk Low fall risk    Patient at Risk for Falls Due to  Impaired  balance/gait  Medication side effect   Fall risk Follow up  Falls evaluation completed  Falls evaluation completed;Education provided;Falls prevention discussed      SUMMARY AND PLAN:  No diagnosis found.  Visit coding *** [phone] (annual wellness visit -initial); G0439 (annual wellness subsequent); G0402 Welcome to Medicare(initial preventive physical exam)   Discussed applicable health maintenance/preventive health measures and advised and referred or ordered per patient preferences:  Health Maintenance  Topic Date Due   OPHTHALMOLOGY EXAM  Never done   DTaP/Tdap/Td (1 - Tdap) Never done   Zoster Vaccines- Shingrix (1 of 2) Never done   Pneumonia Vaccine 79+ Years old (1 of 1 - PCV) Never done   DEXA SCAN  Never done   COVID-19 Vaccine (4 - 2023-24 season) 08/13/2022   Medicare Annual Wellness (AWV)  02/05/2023   INFLUENZA VACCINE  04/09/2023   HEMOGLOBIN A1C  06/05/2023   Diabetic kidney evaluation - eGFR measurement  12/02/2023   Diabetic kidney evaluation - Urine ACR  12/02/2023   FOOT EXAM  12/02/2023   HPV VACCINES  Aged Out     Vaccines   Mammogram   Screening Pap smear/pelvic exam   Lung Cancer Screening   Colorectal cancer screening   Osteoporosis screening if applicable  Screening for glaucoma  Statin use for primary prevention  Cardiovascular screening blood tests   Diabetes screening tests  Hepatitis B screening if applicable  HIV screening   Hepatitis C screening   Chlamydia/Gonorrhea screening  STI Screening   Syphilis Screening if applicable  Latent TB screening if applicable  Breast Cancer prevention medication if applicable  BRCA related risk assessment and referral for counseling if applicable  Education and counseling on the following was provided based on the above review of health and a plan/checklist for the patient, along with additional information discussed, was provided for the patient in the patient instructions  :  -Advised on importance of completing advanced directives, discussed options for completing and provided information in patient instructions as well -Provided counseling and plan for difficulty hearing  -Provided counseling and plan for increased risk of falling if applicable per above screening. Reviewed and demonstrated safe balance exercises that can be done at home to improve balance and discussed exercise guidelines for adults with include balance exercises at least 3 days per week.  -Advised and counseled on a healthy lifestyle - including the importance of a healthy diet, regular physical activity, social connections and stress management. -Reviewed patient's current diet. Advised and counseled on a whole foods based healthy diet. A summary of a healthy diet was provided in the Patient Instructions.  -reviewed patient's current physical activity level and discussed exercise guidelines for adults. Discussed community resources and ideas for safe exercise at home to assist in meeting exercise guideline recommendations in a safe and healthy way.  -Advise yearly dental visits at minimum and regular eye exams -Advised and counseled on alcohol safe limits, risks/ tobacco use, risks of smoking and offered counseling/help, drug, opoid use/misuse   Follow up: see patient instructions     There are no Patient Instructions on file for this visit.  Terressa Koyanagi, DO

## 2023-03-20 ENCOUNTER — Other Ambulatory Visit: Payer: Self-pay | Admitting: Adult Health

## 2023-03-20 DIAGNOSIS — L299 Pruritus, unspecified: Secondary | ICD-10-CM

## 2023-03-20 DIAGNOSIS — E118 Type 2 diabetes mellitus with unspecified complications: Secondary | ICD-10-CM

## 2023-03-20 NOTE — Telephone Encounter (Signed)
Pt needs a follow appt  

## 2023-04-13 ENCOUNTER — Ambulatory Visit (INDEPENDENT_AMBULATORY_CARE_PROVIDER_SITE_OTHER): Payer: Medicare Other

## 2023-04-13 VITALS — Ht 61.0 in | Wt 170.0 lb

## 2023-04-13 DIAGNOSIS — Z0000 Encounter for general adult medical examination without abnormal findings: Secondary | ICD-10-CM

## 2023-04-14 NOTE — Patient Instructions (Addendum)
Ann Thompson , Thank you for taking time to come for your Medicare Wellness Visit. I appreciate your ongoing commitment to your health goals. Please review the following plan we discussed and let me know if I can assist you in the future.   Referrals/Orders/Follow-Ups/Clinician Recommendations:   This is a list of the screening recommended for you and due dates:  Health Maintenance  Topic Date Due   Eye exam for diabetics  Never done   DTaP/Tdap/Td vaccine (1 - Tdap) Never done   Zoster (Shingles) Vaccine (1 of 2) Never done   Pneumonia Vaccine (1 of 1 - PCV) Never done   DEXA scan (bone density measurement)  Never done   COVID-19 Vaccine (4 - 2023-24 season) 08/13/2022   Flu Shot  04/09/2023   Hemoglobin A1C  06/05/2023   Yearly kidney function blood test for diabetes  12/02/2023   Yearly kidney health urinalysis for diabetes  12/02/2023   Complete foot exam   12/02/2023   Medicare Annual Wellness Visit  04/12/2024   HPV Vaccine  Aged Out    Advanced directives: (Copy Requested) Please bring a copy of your health care power of attorney and living will to the office to be added to your chart at your convenience.  Next Medicare Annual Wellness Visit scheduled for next year: Yes  Preventive Care 80 Years and Older, Female Preventive care refers to lifestyle choices and visits with your health care provider that can promote health and wellness. What does preventive care include? A yearly physical exam. This is also called an annual well check. Dental exams once or twice a year. Routine eye exams. Ask your health care provider how often you should have your eyes checked. Personal lifestyle choices, including: Daily care of your teeth and gums. Regular physical activity. Eating a healthy diet. Avoiding tobacco and drug use. Limiting alcohol use. Practicing safe sex. Taking low-dose aspirin every day. Taking vitamin and mineral supplements as recommended by your health care  provider. What happens during an annual well check? The services and screenings done by your health care provider during your annual well check will depend on your age, overall health, lifestyle risk factors, and family history of disease. Counseling  Your health care provider may ask you questions about your: Alcohol use. Tobacco use. Drug use. Emotional well-being. Home and relationship well-being. Sexual activity. Eating habits. History of falls. Memory and ability to understand (cognition). Work and work Astronomer. Reproductive health. Screening  You may have the following tests or measurements: Height, weight, and BMI. Blood pressure. Lipid and cholesterol levels. These may be checked every 5 years, or more frequently if you are over 52 years old. Skin check. Lung cancer screening. You may have this screening every year starting at age 25 if you have a 30-pack-year history of smoking and currently smoke or have quit within the past 15 years. Fecal occult blood test (FOBT) of the stool. You may have this test every year starting at age 66. Flexible sigmoidoscopy or colonoscopy. You may have a sigmoidoscopy every 5 years or a colonoscopy every 10 years starting at age 39. Hepatitis C blood test. Hepatitis B blood test. Sexually transmitted disease (STD) testing. Diabetes screening. This is done by checking your blood sugar (glucose) after you have not eaten for a while (fasting). You may have this done every 1-3 years. Bone density scan. This is done to screen for osteoporosis. You may have this done starting at age 45. Mammogram. This may be done every  1-2 years. Talk to your health care provider about how often you should have regular mammograms. Talk with your health care provider about your test results, treatment options, and if necessary, the need for more tests. Vaccines  Your health care provider may recommend certain vaccines, such as: Influenza vaccine. This is  recommended every year. Tetanus, diphtheria, and acellular pertussis (Tdap, Td) vaccine. You may need a Td booster every 10 years. Zoster vaccine. You may need this after age 50. Pneumococcal 13-valent conjugate (PCV13) vaccine. One dose is recommended after age 74. Pneumococcal polysaccharide (PPSV23) vaccine. One dose is recommended after age 44. Talk to your health care provider about which screenings and vaccines you need and how often you need them. This information is not intended to replace advice given to you by your health care provider. Make sure you discuss any questions you have with your health care provider. Document Released: 09/21/2015 Document Revised: 05/14/2016 Document Reviewed: 06/26/2015 Elsevier Interactive Patient Education  2017 [employer].  Fall Prevention in the Home Falls can cause injuries. They can happen to people of all ages. There are many things you can do to make your home safe and to help prevent falls. What can I do on the outside of my home? Regularly fix the edges of walkways and driveways and fix any cracks. Remove anything that might make you trip as you walk through a door, such as a raised step or threshold. Trim any bushes or trees on the path to your home. Use bright outdoor lighting. Clear any walking paths of anything that might make someone trip, such as rocks or tools. Regularly check to see if handrails are loose or broken. Make sure that both sides of any steps have handrails. Any raised decks and porches should have guardrails on the edges. Have any leaves, snow, or ice cleared regularly. Use sand or salt on walking paths during winter. Clean up any spills in your garage right away. This includes oil or grease spills. What can I do in the bathroom? Use night lights. Install grab bars by the toilet and in the tub and shower. Do not use towel bars as grab bars. Use non-skid mats or decals in the tub or shower. If you need to sit down in  the shower, use a plastic, non-slip stool. Keep the floor dry. Clean up any water that spills on the floor as soon as it happens. Remove soap buildup in the tub or shower regularly. Attach bath mats securely with double-sided non-slip rug tape. Do not have throw rugs and other things on the floor that can make you trip. What can I do in the bedroom? Use night lights. Make sure that you have a light by your bed that is easy to reach. Do not use any sheets or blankets that are too big for your bed. They should not hang down onto the floor. Have a firm chair that has side arms. You can use this for support while you get dressed. Do not have throw rugs and other things on the floor that can make you trip. What can I do in the kitchen? Clean up any spills right away. Avoid walking on wet floors. Keep items that you use a lot in easy-to-reach places. If you need to reach something above you, use a strong step stool that has a grab bar. Keep electrical cords out of the way. Do not use floor polish or wax that makes floors slippery. If you must use wax, use non-skid  floor wax. Do not have throw rugs and other things on the floor that can make you trip. What can I do with my stairs? Do not leave any items on the stairs. Make sure that there are handrails on both sides of the stairs and use them. Fix handrails that are broken or loose. Make sure that handrails are as long as the stairways. Check any carpeting to make sure that it is firmly attached to the stairs. Fix any carpet that is loose or worn. Avoid having throw rugs at the top or bottom of the stairs. If you do have throw rugs, attach them to the floor with carpet tape. Make sure that you have a light switch at the top of the stairs and the bottom of the stairs. If you do not have them, ask someone to add them for you. What else can I do to help prevent falls? Wear shoes that: Do not have high heels. Have rubber bottoms. Are comfortable  and fit you well. Are closed at the toe. Do not wear sandals. If you use a stepladder: Make sure that it is fully opened. Do not climb a closed stepladder. Make sure that both sides of the stepladder are locked into place. Ask someone to hold it for you, if possible. Clearly mark and make sure that you can see: Any grab bars or handrails. First and last steps. Where the edge of each step is. Use tools that help you move around (mobility aids) if they are needed. These include: Canes. Walkers. Scooters. Crutches. Turn on the lights when you go into a dark area. Replace any light bulbs as soon as they burn out. Set up your furniture so you have a clear path. Avoid moving your furniture around. If any of your floors are uneven, fix them. If there are any pets around you, be aware of where they are. Review your medicines with your doctor. Some medicines can make you feel dizzy. This can increase your chance of falling. Ask your doctor what other things that you can do to help prevent falls. This information is not intended to replace advice given to you by your health care provider. Make sure you discuss any questions you have with your health care provider. Document Released: 06/21/2009 Document Revised: 01/31/2016 Document Reviewed: 09/29/2014 Elsevier Interactive Patient Education  2017 [employer].

## 2023-04-14 NOTE — Progress Notes (Signed)
Subjective:   Ann Thompson is a 82 y.o. female who presents for Medicare Annual (Subsequent) preventive examination.  Visit Complete: Virtual  I connected with  Ann Thompson on 04/14/23 by a audio enabled telemedicine application and verified that I am speaking with the correct person using two identifiers.  Patient Location: Home  Provider Location: Home Office  I discussed the limitations of evaluation and management by telemedicine. The patient expressed understanding and agreed to proceed.  Patient Medicare AWV questionnaire was completed by the patient on ; I have confirmed that all information answered by patient is correct and no changes since this date.  Review of Systems    Vital Signs: Unable to obtain new vitals due to this being a telehealth visit.  Cardiac Risk Factors include: advanced age (>7men, >21 women);hypertension;diabetes mellitus     Objective:    Today's Vitals   04/14/23 1507  Weight: 170 lb (77.1 kg)  Height: 5\' 1"  (1.549 m)   Body mass index is 32.12 kg/m.     04/14/2023    3:24 PM 02/04/2022   11:53 AM 02/27/2021    3:19 PM 01/30/2021    2:06 PM 10/26/2020    9:52 AM 10/08/2020    8:32 AM  Advanced Directives  Does Patient Have a Medical Advance Directive? Yes Yes Yes Yes Yes Yes  Type of Estate agent of [place];Living will Healthcare Power of [place];Living will Healthcare Power of [place];Living will Healthcare Power of [place];Living will  Healthcare Power of [place];Living will  Does patient want to make changes to medical advance directive?    No - Patient declined No - Patient declined No - Patient declined  Copy of Healthcare Power of Attorney in Chart? No - copy requested No - copy requested No - copy requested No - copy requested  No - copy requested    Current Medications (verified) Outpatient Encounter Medications as of 04/13/2023  Medication Sig   acetaminophen (TYLENOL) 325 MG tablet     bisoprolol-hydrochlorothiazide (ZIAC) 2.5-6.25 MG tablet TAKE 1 TABLET BY MOUTH EVERY DAY   celecoxib (CELEBREX) 200 MG capsule TAKE 1 CAPSULE BY MOUTH EVERY DAY   cycloSPORINE (RESTASIS) 0.05 % ophthalmic emulsion    doxycycline (VIBRAMYCIN) 100 MG capsule Take 1 capsule (100 mg total) by mouth 2 (two) times daily.   FLAREX 0.1 % ophthalmic suspension Apply to eye.   fluticasone (FLONASE) 50 MCG/ACT nasal spray USE 2 SPRAYS IN EACH NOSTRIL EVERY DAY   glipiZIDE (GLUCOTROL XL) 2.5 MG 24 hr tablet TAKE 1 TABLET BY MOUTH EVERY DAY WITH breakfast   Glucosamine-Chondroit-Vit C-Mn (GLUCOSAMINE 1500 COMPLEX) CAPS Take 1 capsule by mouth daily.   JANUVIA 50 MG tablet TAKE 1 TABLET BY MOUTH EVERY DAY   Multiple Vitamins-Minerals (PRESERVISION AREDS PO) Take 1 tablet by mouth daily.   Polyvinyl Alcohol-Povidone PF (REFRESH) 1.4-0.6 % SOLN    Potassium 75 MG TABS potassium   pramipexole (MIRAPEX) 1 MG tablet Take 2 tablets (2 mg total) by mouth at bedtime.   sertraline (ZOLOFT) 50 MG tablet TAKE 1 TABLET BY MOUTH EVERY DAY   Tetrahydrozoline-Zn Sulfate (EYE DROPS A/C OP) Apply 1 drop to eye daily as needed (dry eyes).   triamcinolone cream (KENALOG) 0.1 % 1 application   triamterene-hydrochlorothiazide (MAXZIDE-25) 37.5-25 MG tablet TAKE 1 TABLET BY MOUTH EVERY DAY   UNABLE TO FIND Med Name: cbd oil to knees prn   vitamin B-12 (CYANOCOBALAMIN) 100 MCG tablet Vitamin B12   No facility-administered encounter medications on  file as of 04/13/2023.    Allergies (verified) Diclofenac, Simvastatin, and Metformin   History: Past Medical History:  Diagnosis Date   Arthritis    DM type 2 (diabetes mellitus, type 2) (HCC)    GERD (gastroesophageal reflux disease)    History of kidney stones    Hyperlipemia    Hypertension    Left leg pain    Wears hearing aid in both ears    Past Surgical History:  Procedure Laterality Date   ABDOMINAL HYSTERECTOMY  1982   partial   APPENDECTOMY  1957   BACK  SURGERY  07/12/2020   lower at [facility]   CYSTOSCOPY W/ URETERAL STENT PLACEMENT Right 10/06/2020   Procedure: CYSTOSCOPY WITH RETROGRADE PYELOGRAM/URETERAL STENT PLACEMENT;  Surgeon: Sebastian Ache, MD;  Location: [facility] OR;  Service: Urology;  Laterality: Right;   CYSTOSCOPY WITH RETROGRADE PYELOGRAM, URETEROSCOPY AND STENT PLACEMENT Right 10/26/2020   Procedure: CYSTOSCOPY WITH RETROGRADE PYELOGRAM, URETEROSCOPY AND STENT REPLACEMENT;  Surgeon: Sebastian Ache, MD;  Location: [facility];  Service: Urology;  Laterality: Right;  90 MINS   HOLMIUM LASER APPLICATION Right 10/26/2020   Procedure: HOLMIUM LASER APPLICATION;  Surgeon: Sebastian Ache, MD;  Location: [facility];  Service: Urology;  Laterality: Right;   JOINT REPLACEMENT  2016   both knees partial replacement   Family History  Family history unknown: Yes   Social History   Socioeconomic History   Marital status: Married    Spouse name: Not on file   Number of children: Not on file   Years of education: Not on file   Highest education level: GED or equivalent  Occupational History   Not on file  Tobacco Use   Smoking status: Former    Current packs/day: 0.00    Average packs/day: 1 pack/day for 25.0 years (25.0 ttl pk-yrs)    Types: Cigarettes    Start date: 09/08/1956    Quit date: 09/08/1981    Years since quitting: 41.6   Smokeless tobacco: Never  Vaping Use   Vaping status: Never Used  Substance and Sexual Activity   Alcohol use: Never   Drug use: Never   Sexual activity: Not on file  Other Topics Concern   Not on file  Social History Narrative   Not on file   Social Determinants of Health   Financial Resource Strain: Low Risk  (04/14/2023)   Overall Financial Resource Strain (CARDIA)    Difficulty of Paying Living Expenses: Not hard at all  Food Insecurity: No Food Insecurity (04/14/2023)   Hunger Vital Sign    Worried About Running Out of Food in the Last Year: Never  true    Ran Out of Food in the Last Year: Never true  Transportation Needs: No Transportation Needs (04/14/2023)   PRAPARE - Administrator, Civil Service (Medical): No    Lack of Transportation (Non-Medical): No  Physical Activity: Sufficiently Active (04/14/2023)   Exercise Vital Sign    Days of Exercise per Week: 3 days    Minutes of Exercise per Session: 60 min  Stress: No Stress Concern Present (04/14/2023)   [employer] of Occupational Health - Occupational Stress Questionnaire    Feeling of Stress : Not at all  Social Connections: Moderately Isolated (04/14/2023)   Social Connection and Isolation Panel [NHANES]    Frequency of Communication with Friends and Family: More than three times a week    Frequency of Social Gatherings with Friends and Family: More  than three times a week    Attends Religious Services: Never    Active Member of Clubs or Organizations: No    Attends Banker Meetings: Never    Marital Status: Married    Tobacco Counseling Counseling given: Not Answered   Clinical Intake:  Pre-visit preparation completed: No  Pain : No/denies pain     BMI - recorded: 33.12 Nutritional Status: BMI > 30  Obese Nutritional Risks: None Diabetes: Yes CBG done?: No Did pt. bring in CBG monitor from home?: No  How often do you need to have someone help you when you read instructions, pamphlets, or other written materials from your doctor or pharmacy?: 1 - Never  Interpreter Needed?: No  Information entered by :: Theresa Mulligan LPN   Activities of Daily Living    04/14/2023    3:16 PM 07/23/2022   11:36 AM  In your present state of health, do you have any difficulty performing the following activities:  Hearing? 1 1  Comment Wears hearing Aides hearing aid  Vision? 0 1  Comment  wears glasses (does not have them on today) need them for distance  Difficulty concentrating or making decisions? 0 0  Walking or climbing stairs? 0 1   Comment  sometimes  Dressing or bathing? 0 0  Doing errands, shopping? 0 0  Preparing Food and eating ? N   Using the Toilet? N   In the past six months, have you accidently leaked urine? N   Do you have problems with loss of bowel control? N   Managing your Medications? N   Managing your Finances? N   Housekeeping or managing your Housekeeping? N     Patient Care Team: [name], NP as PCP - General (Family Medicine)  Indicate any recent Medical Services you may have received from other than [facility] providers in the past year (date may be approximate).     Assessment:   This is a routine wellness examination for [place].  Hearing/Vision screen Hearing Screening - Comments:: Wears hearing aids Vision Screening - Comments:: Wears rx glasses - up to date with routine eye exams with  [facility]  Dietary issues and exercise activities discussed:     Goals Addressed               This Visit's Progress     Stay Healthy (pt-stated)         Depression Screen    04/14/2023    3:15 PM 02/04/2022   11:54 AM 09/25/2021   10:16 AM 01/30/2021    2:11 PM 09/21/2020    1:09 PM  PHQ 2/9 Scores  PHQ - 2 Score 0 0 0 0 0  PHQ- 9 Score   0      Fall Risk    04/14/2023    3:23 PM 06/27/2022   11:54 AM 02/04/2022   11:54 AM 09/25/2021   10:16 AM 01/30/2021    2:10 PM  Fall Risk   Falls in the past year? 0 0 0 0 0  Number falls in past yr: 0  0 0 0  Injury with Fall? 0  0 0 0  Risk for fall due to : No Fall Risks  Medication side effect  Impaired balance/gait  Follow up Falls prevention discussed  Falls evaluation completed;Education provided;Falls prevention discussed  Falls evaluation completed    MEDICARE RISK AT HOME:  Medicare Risk at Home - 04/14/23 1530     Any stairs in or  around the home? Yes    If so, are there any without handrails? No    Home free of loose throw rugs in walkways, pet beds, electrical cords, etc? Yes    Adequate lighting in your home to  reduce risk of falls? Yes    Life alert? No    Use of a cane, walker or w/c? No    Grab bars in the bathroom? Yes    Shower chair or bench in shower? Yes    Elevated toilet seat or a handicapped toilet? Yes             TIMED UP AND GO:  Was the test performed?  No    Cognitive Function:        04/14/2023    3:24 PM 02/04/2022   11:56 AM  6CIT Screen  What Year? 0 points 0 points  What month? 0 points 0 points  What time? 0 points 0 points  Count back from 20 0 points 0 points  Months in reverse 0 points 0 points  Repeat phrase 0 points 0 points  Total Score 0 points 0 points    Immunizations Immunization History  Administered Date(s) Administered   COVID-19, mRNA, vaccine(Comirnaty)12 years and older 06/18/2022   Fluad Quad(high Dose 65+) 06/22/2020, 06/18/2022   Influenza-Unspecified 07/05/2021   PFIZER(Purple Top)SARS-COV-2 Vaccination 10/10/2019, 11/06/2019    TDAP status: Due, Education has been provided regarding the importance of this vaccine. Advised may receive this vaccine at local pharmacy or Health Dept. Aware to provide a copy of the vaccination record if obtained from local pharmacy or Health Dept. Verbalized acceptance and understanding.  Flu Vaccine status: Due, Education has been provided regarding the importance of this vaccine. Advised may receive this vaccine at local pharmacy or Health Dept. Aware to provide a copy of the vaccination record if obtained from local pharmacy or Health Dept. Verbalized acceptance and understanding.  Pneumococcal vaccine status: Declined,  Education has been provided regarding the importance of this vaccine but patient still declined. Advised may receive this vaccine at local pharmacy or Health Dept. Aware to provide a copy of the vaccination record if obtained from local pharmacy or Health Dept. Verbalized acceptance and understanding.   Covid-19 vaccine status: Completed vaccines  Qualifies for Shingles Vaccine? Yes    Zostavax completed No   Shingrix Completed?: No.    Education has been provided regarding the importance of this vaccine. Patient has been advised to call insurance company to determine out of pocket expense if they have not yet received this vaccine. Advised may also receive vaccine at local pharmacy or Health Dept. Verbalized acceptance and understanding.  Screening Tests Health Maintenance  Topic Date Due   OPHTHALMOLOGY EXAM  Never done   DTaP/Tdap/Td (1 - Tdap) Never done   Zoster Vaccines- Shingrix (1 of 2) Never done   Pneumonia Vaccine 64+ Years old (1 of 1 - PCV) Never done   DEXA SCAN  Never done   COVID-19 Vaccine (4 - 2023-24 season) 08/13/2022   INFLUENZA VACCINE  04/09/2023   HEMOGLOBIN A1C  06/05/2023   Diabetic kidney evaluation - eGFR measurement  12/02/2023   Diabetic kidney evaluation - Urine ACR  12/02/2023   FOOT EXAM  12/02/2023   Medicare Annual Wellness (AWV)  04/12/2024   HPV VACCINES  Aged Out    Health Maintenance  Health Maintenance Due  Topic Date Due   OPHTHALMOLOGY EXAM  Never done   DTaP/Tdap/Td (1 - Tdap) Never done  Zoster Vaccines- Shingrix (1 of 2) Never done   Pneumonia Vaccine 64+ Years old (1 of 1 - PCV) Never done   DEXA SCAN  Never done   COVID-19 Vaccine (4 - 2023-24 season) 08/13/2022   INFLUENZA VACCINE  04/09/2023    Colorectal cancer screening: No longer required.   Mammogram status: No longer required due to Age.  Bone Density status: Ordered Deferred. Pt provided with contact info and advised to call to schedule appt.  Lung Cancer Screening: (Low Dose CT Chest recommended if Age 12-80 years, 20 pack-year currently smoking OR have quit w/in 15years.) does not qualify.     Additional Screening:  Hepatitis C Screening: does not qualify; Completed   Vision Screening: Recommended annual ophthalmology exams for early detection of glaucoma and other disorders of the eye. Is the patient up to date with their annual eye exam?   Yes  Who is the provider or what is the name of the office in which the patient attends annual eye exams? [facility] If pt is not established with a provider, would they like to be referred to a provider to establish care? No .   Dental Screening: Recommended annual dental exams for proper oral hygiene  Diabetic Foot Exam: Diabetic Foot Exam: Completed 12/02/22  Community Resource Referral / Chronic Care Management:  CRR required this visit?  No   CCM required this visit?  No     Plan:     I have personally reviewed and noted the following in the patient's chart:   Medical and social history Use of alcohol, tobacco or illicit drugs  Current medications and supplements including opioid prescriptions. Patient is not currently taking opioid prescriptions. Functional ability and status Nutritional status Physical activity Advanced directives List of other physicians Hospitalizations, surgeries, and ER visits in previous 12 months Vitals Screenings to include cognitive, depression, and falls Referrals and appointments  In addition, I have reviewed and discussed with patient certain preventive protocols, quality metrics, and best practice recommendations. A written personalized care plan for preventive services as well as general preventive health recommendations were provided to patient.     Tillie Rung, LPN   [id number]   After Visit Summary: (MyChart) Due to this being a telephonic visit, the after visit summary with patients personalized plan was offered to patient via MyChart   Nurse Notes: None

## 2023-04-23 NOTE — Progress Notes (Signed)
Subjective:   Ann Thompson is a 82 y.o. female who presents for Medicare Annual (Subsequent) preventive examination.  Visit Complete: Virtual  I connected with  [name] on 04/13/23 by a audio enabled telemedicine application and verified that I am speaking with the correct person using two identifiers.  Patient Location: Home  Provider Location: Home Office  I discussed the limitations of evaluation and management by telemedicine. The patient expressed understanding and agreed to proceed.   Review of Systems    Vital Signs: Unable to obtain new vitals due to this being a telehealth visit.  Cardiac Risk Factors include: advanced age (>37men, >57 women);hypertension;diabetes mellitus     Objective:    Today's Vitals   04/13/23 1101 04/14/23 1507  Weight: 170 lb (77.1 kg) 170 lb (77.1 kg)  Height: 5\' 1"  (1.549 m) 5\' 1"  (1.549 m)   Body mass index is 32.12 kg/m.     04/14/2023    3:24 PM 04/13/2023   11:09 AM 02/04/2022   11:53 AM 02/27/2021    3:19 PM 01/30/2021    2:06 PM 10/26/2020    9:52 AM 10/08/2020    8:32 AM  Advanced Directives  Does Patient Have a Medical Advance Directive? Yes Yes Yes Yes Yes Yes Yes  Type of Estate agent of [place];Living will Healthcare Power of [place];Living will Healthcare Power of [place];Living will Healthcare Power of [place];Living will Healthcare Power of [place];Living will  Healthcare Power of [place];Living will  Does patient want to make changes to medical advance directive?     No - Patient declined No - Patient declined No - Patient declined  Copy of Healthcare Power of Attorney in Chart? No - copy requested No - copy requested No - copy requested No - copy requested No - copy requested  No - copy requested    Current Medications (verified) Outpatient Encounter Medications as of 04/13/2023  Medication Sig   acetaminophen (TYLENOL) 325 MG tablet    bisoprolol-hydrochlorothiazide (ZIAC) 2.5-6.25 MG tablet  TAKE 1 TABLET BY MOUTH EVERY DAY   celecoxib (CELEBREX) 200 MG capsule TAKE 1 CAPSULE BY MOUTH EVERY DAY   cycloSPORINE (RESTASIS) 0.05 % ophthalmic emulsion    doxycycline (VIBRAMYCIN) 100 MG capsule Take 1 capsule (100 mg total) by mouth 2 (two) times daily.   FLAREX 0.1 % ophthalmic suspension Apply to eye.   fluticasone (FLONASE) 50 MCG/ACT nasal spray USE 2 SPRAYS IN EACH NOSTRIL EVERY DAY   glipiZIDE (GLUCOTROL XL) 2.5 MG 24 hr tablet TAKE 1 TABLET BY MOUTH EVERY DAY WITH breakfast   Glucosamine-Chondroit-Vit C-Mn (GLUCOSAMINE 1500 COMPLEX) CAPS Take 1 capsule by mouth daily.   JANUVIA 50 MG tablet TAKE 1 TABLET BY MOUTH EVERY DAY   Multiple Vitamins-Minerals (PRESERVISION AREDS PO) Take 1 tablet by mouth daily.   Polyvinyl Alcohol-Povidone PF (REFRESH) 1.4-0.6 % SOLN    Potassium 75 MG TABS potassium   pramipexole (MIRAPEX) 1 MG tablet Take 2 tablets (2 mg total) by mouth at bedtime.   sertraline (ZOLOFT) 50 MG tablet TAKE 1 TABLET BY MOUTH EVERY DAY   Tetrahydrozoline-Zn Sulfate (EYE DROPS A/C OP) Apply 1 drop to eye daily as needed (dry eyes).   triamcinolone cream (KENALOG) 0.1 % 1 application   triamterene-hydrochlorothiazide (MAXZIDE-25) 37.5-25 MG tablet TAKE 1 TABLET BY MOUTH EVERY DAY   UNABLE TO FIND Med Name: cbd oil to knees prn   vitamin B-12 (CYANOCOBALAMIN) 100 MCG tablet Vitamin B12   No facility-administered encounter medications on file as  of 04/13/2023.    Allergies (verified) Diclofenac, Simvastatin, and Metformin   History: Past Medical History:  Diagnosis Date   Arthritis    DM type 2 (diabetes mellitus, type 2) (HCC)    GERD (gastroesophageal reflux disease)    History of kidney stones    Hyperlipemia    Hypertension    Left leg pain    Wears hearing aid in both ears    Past Surgical History:  Procedure Laterality Date   ABDOMINAL HYSTERECTOMY  1982   partial   APPENDECTOMY  1957   BACK SURGERY  07/12/2020   [facility]    CYSTOSCOPY W/ URETERAL STENT PLACEMENT Right 10/06/2020   Procedure: CYSTOSCOPY WITH RETROGRADE PYELOGRAM/URETERAL STENT PLACEMENT;  Surgeon: Sebastian Ache, MD;  [facility];  Service: Urology;  Laterality: Right;   CYSTOSCOPY WITH RETROGRADE PYELOGRAM, URETEROSCOPY AND STENT PLACEMENT Right 10/26/2020   Procedure: CYSTOSCOPY WITH RETROGRADE PYELOGRAM, URETEROSCOPY AND STENT REPLACEMENT;  Surgeon: Sebastian Ache, MD;  Location: [facility];  Service: Urology;  Laterality: Right;  90 MINS   HOLMIUM LASER APPLICATION Right 10/26/2020   Procedure: HOLMIUM LASER APPLICATION;  Surgeon: Sebastian Ache, MD;  Location: [facility];  Service: Urology;  Laterality: Right;   JOINT REPLACEMENT  2016   both knees partial replacement   Family History  Family history unknown: Yes   Social History   Socioeconomic History   Marital status: Married    Spouse name: Not on file   Number of children: Not on file   Years of education: Not on file   Highest education level: GED or equivalent  Occupational History   Not on file  Tobacco Use   Smoking status: Former    Current packs/day: 0.00    Average packs/day: 1 pack/day for 25.0 years (25.0 ttl pk-yrs)    Types: Cigarettes    Start date: 09/08/1956    Quit date: 09/08/1981    Years since quitting: 41.6   Smokeless tobacco: Never  Vaping Use   Vaping status: Never Used  Substance and Sexual Activity   Alcohol use: Never   Drug use: Never   Sexual activity: Not on file  Other Topics Concern   Not on file  Social History Narrative   Not on file   Social Determinants of Health   Financial Resource Strain: Low Risk  (04/14/2023)   Overall Financial Resource Strain (CARDIA)    Difficulty of Paying Living Expenses: Not hard at all  Food Insecurity: No Food Insecurity (04/14/2023)   Hunger Vital Sign    Worried About Running Out of Food in the Last Year: Never true    Ran Out of Food in the Last Year: Never true   Transportation Needs: No Transportation Needs (04/14/2023)   PRAPARE - Administrator, Civil Service (Medical): No    Lack of Transportation (Non-Medical): No  Physical Activity: Sufficiently Active (04/14/2023)   Exercise Vital Sign    Days of Exercise per Week: 3 days    Minutes of Exercise per Session: 60 min  Stress: No Stress Concern Present (04/14/2023)   [employer] of Occupational Health - Occupational Stress Questionnaire    Feeling of Stress : Not at all  Social Connections: Moderately Isolated (04/14/2023)   Social Connection and Isolation Panel [NHANES]    Frequency of Communication with Friends and Family: More than three times a week    Frequency of Social Gatherings with Friends and Family: More than three  times a week    Attends Religious Services: Never    Active Member of Clubs or Organizations: No    Attends Engineer, structural: Never    Marital Status: Married    Tobacco Counseling Counseling given: Not Answered   Clinical Intake:  Pre-visit preparation completed: Yes  Pain : No/denies pain     BMI - recorded: 33.12 Nutritional Status: BMI > 30  Obese Nutritional Risks: None Diabetes: Yes CBG done?: No Did pt. bring in CBG monitor from home?: No  How often do you need to have someone help you when you read instructions, pamphlets, or other written materials from your doctor or pharmacy?: 1 - Never  Interpreter Needed?: No  Information entered by :: Theresa Mulligan LPN   Activities of Daily Living    04/14/2023    3:16 PM 04/13/2023   11:08 AM  In your present state of health, do you have any difficulty performing the following activities:  Hearing? 1 1  Comment Wears hearing Aides Wears hearing Aides  Vision? 0 0  Difficulty concentrating or making decisions? 0 0  Walking or climbing stairs? 0 0  Dressing or bathing? 0 0  Doing errands, shopping? 0 0  Preparing Food and eating ? N N  Using the Toilet? N N  In the past  six months, have you accidently leaked urine? N N  Do you have problems with loss of bowel control? N N  Managing your Medications? N N  Managing your Finances? N N  Housekeeping or managing your Housekeeping? N N    Patient Care Team: [name], NP as PCP - General (Family Medicine)  Indicate any recent Medical Services you may have received from other than [facility] providers in the past year (date may be approximate).     Assessment:   This is a routine wellness examination for [place].  Hearing/Vision screen Hearing Screening - Comments:: Wears hearing aids Vision Screening - Comments:: Wears rx glasses - up to date with routine eye exams with  [facility]  Dietary issues and exercise activities discussed:     Goals Addressed               This Visit's Progress     Stay Healthy (pt-stated)         Depression Screen    04/14/2023    3:15 PM 04/13/2023   11:06 AM 02/04/2022   11:54 AM 09/25/2021   10:16 AM 01/30/2021    2:11 PM 09/21/2020    1:09 PM  PHQ 2/9 Scores  PHQ - 2 Score 0 0 0 0 0 0  PHQ- 9 Score    0      Fall Risk    04/14/2023    3:23 PM 04/13/2023   11:08 AM 06/27/2022   11:54 AM 02/04/2022   11:54 AM 09/25/2021   10:16 AM  Fall Risk   Falls in the past year? 0 0 0 0 0  Number falls in past yr: 0 0  0 0  Injury with Fall? 0 0  0 0  Risk for fall due to : No Fall Risks No Fall Risks  Medication side effect   Follow up Falls prevention discussed Falls evaluation completed;Falls prevention discussed  Falls evaluation completed;Education provided;Falls prevention discussed     MEDICARE RISK AT HOME:  Medicare Risk at Home - 04/23/23 1109     Any stairs in or around the home? Yes    If so, are there  any without handrails? No    Home free of loose throw rugs in walkways, pet beds, electrical cords, etc? Yes    Adequate lighting in your home to reduce risk of falls? Yes    Life alert? No    Use of a cane, walker or w/c? No    Grab bars in the  bathroom? Yes    Shower chair or bench in shower? Yes    Elevated toilet seat or a handicapped toilet? Yes              TIMED UP AND GO:  Was the test performed?  No    Cognitive Function:        04/14/2023    3:24 PM 04/13/2023   11:09 AM 02/04/2022   11:56 AM  6CIT Screen  What Year? 0 points 0 points 0 points  What month? 0 points 0 points 0 points  What time? 0 points 0 points 0 points  Count back from 20 0 points 0 points 0 points  Months in reverse 0 points 0 points 0 points  Repeat phrase 0 points 0 points 0 points  Total Score 0 points 0 points 0 points    Immunizations Immunization History  Administered Date(s) Administered   COVID-19, mRNA, vaccine(Comirnaty)12 years and older 06/18/2022   Fluad Quad(high Dose 65+) 06/22/2020, 06/18/2022   Influenza-Unspecified 07/05/2021   PFIZER(Purple Top)SARS-COV-2 Vaccination 10/10/2019, 11/06/2019    TDAP status: Due, Education has been provided regarding the importance of this vaccine. Advised may receive this vaccine at local pharmacy or Health Dept. Aware to provide a copy of the vaccination record if obtained from local pharmacy or Health Dept. Verbalized acceptance and understanding.  Flu Vaccine status: Due, Education has been provided regarding the importance of this vaccine. Advised may receive this vaccine at local pharmacy or Health Dept. Aware to provide a copy of the vaccination record if obtained from local pharmacy or Health Dept. Verbalized acceptance and understanding.  Pneumococcal vaccine status: Declined,  Education has been provided regarding the importance of this vaccine but patient still declined. Advised may receive this vaccine at local pharmacy or Health Dept. Aware to provide a copy of the vaccination record if obtained from local pharmacy or Health Dept. Verbalized acceptance and understanding.   Covid-19 vaccine status: Completed vaccines  Qualifies for Shingles Vaccine? Yes   Zostavax  completed No   Shingrix Completed?: No.    Education has been provided regarding the importance of this vaccine. Patient has been advised to call insurance company to determine out of pocket expense if they have not yet received this vaccine. Advised may also receive vaccine at local pharmacy or Health Dept. Verbalized acceptance and understanding.  Screening Tests Health Maintenance  Topic Date Due   OPHTHALMOLOGY EXAM  Never done   DTaP/Tdap/Td (1 - Tdap) Never done   Zoster Vaccines- Shingrix (1 of 2) Never done   Pneumonia Vaccine 39+ Years old (1 of 1 - PCV) Never done   DEXA SCAN  Never done   COVID-19 Vaccine (4 - 2023-24 season) 08/13/2022   INFLUENZA VACCINE  04/09/2023   HEMOGLOBIN A1C  06/05/2023   Diabetic kidney evaluation - eGFR measurement  12/02/2023   Diabetic kidney evaluation - Urine ACR  12/02/2023   FOOT EXAM  12/02/2023   Medicare Annual Wellness (AWV)  04/12/2024   HPV VACCINES  Aged Out    Health Maintenance  Health Maintenance Due  Topic Date Due   OPHTHALMOLOGY EXAM  Never done  DTaP/Tdap/Td (1 - Tdap) Never done   Zoster Vaccines- Shingrix (1 of 2) Never done   Pneumonia Vaccine 67+ Years old (1 of 1 - PCV) Never done   DEXA SCAN  Never done   COVID-19 Vaccine (4 - 2023-24 season) 08/13/2022   INFLUENZA VACCINE  04/09/2023    Colorectal cancer screening: No longer required.   Mammogram status: No longer required due to Age.  Bone Density status: Ordered Deferred. Pt provided with contact info and advised to call to schedule appt.  Lung Cancer Screening: (Low Dose CT Chest recommended if Age 14-80 years, 20 pack-year currently smoking OR have quit w/in 15years.) does not qualify.     Additional Screening:  Hepatitis C Screening: does not qualify; Completed   Vision Screening: Recommended annual ophthalmology exams for early detection of glaucoma and other disorders of the eye. Is the patient up to date with their annual eye exam?  Yes  Who  is the provider or what is the name of the office in which the patient attends annual eye exams? [facility] If pt is not established with a provider, would they like to be referred to a provider to establish care? No .   Dental Screening: Recommended annual dental exams for proper oral hygiene  Diabetic Foot Exam: Diabetic Foot Exam: Completed 12/02/22  Community Resource Referral / Chronic Care Management:  CRR required this visit?  No   CCM required this visit?  No     Plan:     I have personally reviewed and noted the following in the patient's chart:   Medical and social history Use of alcohol, tobacco or illicit drugs  Current medications and supplements including opioid prescriptions. Patient is not currently taking opioid prescriptions. Functional ability and status Nutritional status Physical activity Advanced directives List of other physicians Hospitalizations, surgeries, and ER visits in previous 12 months Vitals Screenings to include cognitive, depression, and falls Referrals and appointments  In addition, I have reviewed and discussed with patient certain preventive protocols, quality metrics, and best practice recommendations. A written personalized care plan for preventive services as well as general preventive health recommendations were provided to patient.     Tillie Rung, LPN 09/13/08  After Visit Summary: (MyChart) Due to this being a telephonic visit, the after visit summary with patients personalized plan was offered to patient via MyChart   Nurse Notes: None

## 2023-05-08 ENCOUNTER — Ambulatory Visit: Payer: Medicare Other | Admitting: Nurse Practitioner

## 2023-05-08 ENCOUNTER — Encounter: Payer: Self-pay | Admitting: Nurse Practitioner

## 2023-05-08 VITALS — BP 130/80 | HR 80 | Ht 60.0 in | Wt 175.0 lb

## 2023-05-08 DIAGNOSIS — R1310 Dysphagia, unspecified: Secondary | ICD-10-CM | POA: Diagnosis not present

## 2023-05-08 NOTE — Progress Notes (Signed)
Agree with assessment and plan as outlined.  

## 2023-05-08 NOTE — Progress Notes (Signed)
05/08/2023 Ann Thompson [id number] April 19, 1941   CHIEF COMPLAINT: Difficulty swallowing   HISTORY OF PRESENT ILLNESS: Ann Thompson is an 82 year old female with a past medical history of arthritis, hypertension, hyperlipidemia, diabetes mellitus type 2 and kidney stones. She presents to our office today self referred for further evaluation regarding dysphagia. She describes having difficulty swallowing pills and less frequently has difficulty swallowing solid foods which occurs intermittently over the past year. She describes having pills or food such as potato chips which gets stuck in her throat then passes briefly if she drinks a few sips of water. She sometimes gags and coughs but does not expel the stuck food or liquid. She denies having any heartburn, odynophagia or abdominal pain. She is passing normal brown bowel movement daily. No rectal bleeding or black stools. She denies ever having an EGD. She underwent 2 colonoscopies in the past which she reported were normal and the most recent colonoscopy was 03/2016, procedure report not available in care everywhere but pathology report showed mild acute ischemic colitis to the sigmoid colon. She takes Celebrex daily for arthritis.  No known family history of esophageal, gastric or colorectal cancer.     Latest Ref Rng & Units 12/02/2022   10:30 AM 09/25/2021   10:44 AM 10/26/2020   10:10 AM  CBC  WBC 4.0 - 10.5 K/uL 8.4  6.3    Hemoglobin 12.0 - 15.0 g/dL 40.9  81.1  91.4   Hematocrit 36.0 - 46.0 % 41.8  42.2  41.0   Platelets 150.0 - 400.0 K/uL 280.0  217.0         Latest Ref Rng & Units 12/02/2022   10:30 AM 09/25/2021   10:44 AM 10/26/2020   10:10 AM  CMP  Glucose 70 - 99 mg/dL 782  956  213   BUN 6 - 23 mg/dL 15  14  18    Creatinine 0.40 - 1.20 mg/dL 0.86  5.78  4.69   Sodium 135 - 145 mEq/L 137  137  138   Potassium 3.5 - 5.1 mEq/L 4.1  4.2  3.5   Chloride 96 - 112 mEq/L 99  98  99   CO2 19 - 32 mEq/L 29  32    Calcium 8.4 -  10.5 mg/dL 62.9  52.8    Total Protein 6.0 - 8.3 g/dL 7.8  7.2    Total Bilirubin 0.2 - 1.2 mg/dL 0.4  0.4    Alkaline Phos 39 - 117 U/L 123  94    AST 0 - 37 U/L 19  21    ALT 0 - 35 U/L 26  25       Past Medical History:  Diagnosis Date   Arthritis    DM type 2 (diabetes mellitus, type 2) (HCC)    GERD (gastroesophageal reflux disease)    History of kidney stones    Hyperlipemia    Hypertension    Left leg pain    Wears hearing aid in both ears    Past Surgical History:  Procedure Laterality Date   ABDOMINAL HYSTERECTOMY  1982   partial   APPENDECTOMY  1957   BACK SURGERY  07/12/2020   lower [facility]   CYSTOSCOPY W/ URETERAL STENT PLACEMENT Right 10/06/2020   Procedure: CYSTOSCOPY WITH RETROGRADE PYELOGRAM/URETERAL STENT PLACEMENT;  Surgeon: Sebastian Ache, MD;  Location: [facility] OR;  Service: Urology;  Laterality: Right;   CYSTOSCOPY WITH RETROGRADE PYELOGRAM, URETEROSCOPY AND STENT PLACEMENT Right 10/26/2020   Procedure:  CYSTOSCOPY WITH RETROGRADE PYELOGRAM, URETEROSCOPY AND STENT REPLACEMENT;  Surgeon: Sebastian Ache, MD;  Location[facility];  Service: Urology;  Laterality: Right;  90 MINS   HOLMIUM LASER APPLICATION Right 10/26/2020   Procedure: HOLMIUM LASER APPLICATION;  Surgeon: Sebastian Ache, MD;  Location: [facility];  Service: Urology;  Laterality: Right;   JOINT REPLACEMENT  2016   both knees partial replacement   Social History: She is married.  Retired.  She quit smoking cigarettes 41 years ago.  She drinks 1 glass of wine nightly.  Family History: No known family history of esophageal, gastric or colon cancer.  Allergies  Allergen Reactions   Diclofenac Swelling    1Suspected to have caused hives 2015-- had allergy testing  1Suspected to have caused hives 2015-- had allergy testing                                                    Tongue swelling  1Suspected to have caused hives 2015-- had allergy testing   1Suspected to have caused hives 2015-- had allergy testing                                                    Tongue swelling   Simvastatin     Mylagia    Metformin Other (See Comments)      Outpatient Encounter Medications as of 05/08/2023  Medication Sig   acetaminophen (TYLENOL) 325 MG tablet    bisoprolol-hydrochlorothiazide (ZIAC) 2.5-6.25 MG tablet TAKE 1 TABLET BY MOUTH EVERY DAY   celecoxib (CELEBREX) 200 MG capsule TAKE 1 CAPSULE BY MOUTH EVERY DAY   cycloSPORINE (RESTASIS) 0.05 % ophthalmic emulsion    doxycycline (VIBRAMYCIN) 100 MG capsule Take 1 capsule (100 mg total) by mouth 2 (two) times daily.   FLAREX 0.1 % ophthalmic suspension Apply to eye.   fluticasone (FLONASE) 50 MCG/ACT nasal spray USE 2 SPRAYS IN EACH NOSTRIL EVERY DAY   glipiZIDE (GLUCOTROL XL) 2.5 MG 24 hr tablet TAKE 1 TABLET BY MOUTH EVERY DAY WITH breakfast   Glucosamine-Chondroit-Vit C-Mn (GLUCOSAMINE 1500 COMPLEX) CAPS Take 1 capsule by mouth daily.   JANUVIA 50 MG tablet TAKE 1 TABLET BY MOUTH EVERY DAY   Multiple Vitamins-Minerals (PRESERVISION AREDS PO) Take 1 tablet by mouth daily.   Polyvinyl Alcohol-Povidone PF (REFRESH) 1.4-0.6 % SOLN    Potassium 75 MG TABS potassium   pramipexole (MIRAPEX) 1 MG tablet Take 2 tablets (2 mg total) by mouth at bedtime.   sertraline (ZOLOFT) 50 MG tablet TAKE 1 TABLET BY MOUTH EVERY DAY   Tetrahydrozoline-Zn Sulfate (EYE DROPS A/C OP) Apply 1 drop to eye daily as needed (dry eyes).   triamcinolone cream (KENALOG) 0.1 % 1 application   triamterene-hydrochlorothiazide (MAXZIDE-25) 37.5-25 MG tablet TAKE 1 TABLET BY MOUTH EVERY DAY   UNABLE TO FIND Med Name: cbd oil to knees prn   vitamin B-12 (CYANOCOBALAMIN) 100 MCG tablet Vitamin B12   No facility-administered encounter medications on file as of 05/08/2023.   REVIEW OF SYSTEMS:  Gen: Denies fever, sweats or chills. No weight loss.  CV: Denies chest pain, palpitations or edema. Resp: Denies cough, shortness of  breath of hemoptysis.  GI:  See HPI. GU: Denies urinary burning, blood in urine, increased urinary frequency or incontinence. MS: Denies joint pain, muscles aches or weakness. Derm: Denies rash, itchiness, skin lesions or unhealing ulcers. Psych: Denies depression, anxiety, memory loss or confusion. Heme: Denies bruising, easy bleeding. Neuro:  Denies headaches, dizziness or paresthesias. Endo: + Diabetes.   PHYSICAL EXAM: BP 130/80   Pulse 80   Ht 5' (1.524 m)   Wt 175 lb (79.4 kg)   BMI 34.18 kg/m   General: 82 year old female in no acute distress. Head: Normocephalic and atraumatic. Eyes:  Sclerae non-icteric, conjunctive pink. Ears: Normal auditory acuity. Mouth: Dentition intact. No ulcers or lesions.  Neck: Supple, no lymphadenopathy or thyromegaly.  Lungs: Clear bilaterally to auscultation without wheezes, crackles or rhonchi. Heart: Regular rate and rhythm. No murmur, rub or gallop appreciated.  Abdomen: Soft, nontender, nondistended. No masses. No hepatosplenomegaly. Normoactive bowel sounds x 4 quadrants.  Small umbilical hernia. Rectal: Deferred. Musculoskeletal: Symmetrical with no gross deformities. Skin: Warm and dry. No rash or lesions on visible extremities. Extremities: No edema. Neurological: Alert oriented x 4, no focal deficits.  Psychological:  Alert and cooperative. Normal mood and affect.  ASSESSMENT AND PLAN:  82 year old female with dysphagia involving pills and solid foods which occurs intermittently over the past year. -Barium swallow study with tablet -EGD if her symptoms persist or worsen, to further discuss after barium swallow study results reviewed -Patient instructed to avoid eating large pieces of bread, meat or rice -Patient to contact office if symptoms worsen  Colon cancer screening.  Positive Cologuard test 2017.  Patient endorsed undergoing 2 normal colonoscopies in the past.  Colonoscopy 03/2016, procedure report not available in care  everywhere, however, pathology report showed sigmoid colon biopsies with mild acute ischemic colitis. -No further screening colonoscopies recommended due to age       CC:  Ann Frees, NP

## 2023-05-08 NOTE — Patient Instructions (Signed)
Avoid eating large pieces of bread, meats and rice.  You have been scheduled for a Barium Esophogram at [facility] (1st floor of the hospital) on 05/25/23 at 11:00 am. Please arrive 30 minutes prior to your appointment for registration. Make certain not to have anything to eat or drink 3 hours prior to your test. If you need to reschedule for any reason, please contact radiology at [phone] to do so. ______________________________________________  Due to recent changes in healthcare laws, you may see the results of your imaging and laboratory studies on MyChart before your provider has had a chance to review them.  We understand that in some cases there may be results that are confusing or concerning to you. Not all laboratory results come back in the same time frame and the provider may be waiting for multiple results in order to interpret others.  Please give [place] 48 hours in order for your provider to thoroughly review all the results before contacting the office for clarification of your results.   Thank you for trusting me with your gastrointestinal care!   Alcide Evener, CRNP

## 2023-05-20 ENCOUNTER — Ambulatory Visit: Payer: Medicare Other | Admitting: Adult Health

## 2023-05-22 ENCOUNTER — Ambulatory Visit (INDEPENDENT_AMBULATORY_CARE_PROVIDER_SITE_OTHER): Payer: Medicare Other | Admitting: Adult Health

## 2023-05-22 ENCOUNTER — Encounter: Payer: Self-pay | Admitting: Adult Health

## 2023-05-22 VITALS — BP 142/70 | HR 70 | Temp 98.0°F | Ht 60.0 in | Wt 177.0 lb

## 2023-05-22 DIAGNOSIS — R2681 Unsteadiness on feet: Secondary | ICD-10-CM | POA: Diagnosis not present

## 2023-05-22 DIAGNOSIS — E119 Type 2 diabetes mellitus without complications: Secondary | ICD-10-CM

## 2023-05-22 DIAGNOSIS — Z23 Encounter for immunization: Secondary | ICD-10-CM

## 2023-05-22 DIAGNOSIS — I10 Essential (primary) hypertension: Secondary | ICD-10-CM

## 2023-05-22 DIAGNOSIS — M2550 Pain in unspecified joint: Secondary | ICD-10-CM | POA: Diagnosis not present

## 2023-05-22 DIAGNOSIS — Z7984 Long term (current) use of oral hypoglycemic drugs: Secondary | ICD-10-CM

## 2023-05-22 LAB — POCT GLYCOSYLATED HEMOGLOBIN (HGB A1C): Hemoglobin A1C: 6.8 % — AB (ref 4.0–5.6)

## 2023-05-22 MED ORDER — GLIPIZIDE ER 2.5 MG PO TB24
2.5000 mg | ORAL_TABLET | Freq: Every day | ORAL | 1 refills | Status: AC
Start: 2023-05-22 — End: 2023-08-20

## 2023-05-22 MED ORDER — SITAGLIPTIN PHOSPHATE 50 MG PO TABS
50.0000 mg | ORAL_TABLET | Freq: Every day | ORAL | 1 refills | Status: AC
Start: 2023-05-22 — End: ?

## 2023-05-22 NOTE — Addendum Note (Signed)
Addended by: Waymon Amato R on: 05/22/2023 01:36 PM   Modules accepted: Orders

## 2023-05-22 NOTE — Patient Instructions (Addendum)
I am going to refer you to see physical therapy to help get you stronger.   Your A1c was 6.8- this is great!

## 2023-05-22 NOTE — Progress Notes (Signed)
Subjective:    Patient ID: Ann Thompson, female    DOB: 1940-11-28, 82 y.o.   MRN: [id number]  HPI  82 year old female who  has a past medical history of Arthritis, DM type 2 (diabetes mellitus, type 2) (HCC), GERD (gastroesophageal reflux disease), History of kidney stones, Hyperlipemia, Hypertension, Left leg pain, Vision changes, and Wears hearing aid in both ears.  She presents to the office today for multiple ieeus.   Hypertension-currently prescribed Ziac 2.5-6.25 mg daily and Dyazide 37.5-25 mg daily.  She denies dizziness, lightheadedness, chest pain, or shortness of breath. She took her blood pressure just prior to arrival.  BP Readings from Last 3 Encounters:  05/22/23 (!) 142/70  05/08/23 130/80  12/23/22 (!) 160/70    Diabetes mellitus type 2-takes Januvia 50 mg daily and glipizide 2.5 mg daily.   She does not check her blood sugars at home.  Denies episodes of hypoglycemia Lab Results  Component Value Date   HGBA1C 7.7 (H) 12/03/2022   HGBA1C 7.2 (A) 04/01/2022   HGBA1C 6.8 (H) 12/23/2021   Wt Readings from Last 3 Encounters:  05/22/23 177 lb (80.3 kg)  05/08/23 175 lb (79.4 kg)  04/14/23 170 lb (77.1 kg)   She is also concerned about not being able to lose weight. She is no longer going regularly to the gym and her diet has been suffering. She feels unsteady when she walks and due to this does not want to go to the gym or go to water aerobics. She also has a lot of joint pain and low back pain which prevents her from going to the gym. She enjoys doing water aerobics but feels unsafe doing it.   Review of Systems See HPI   Past Medical History:  Diagnosis Date   Arthritis    DM type 2 (diabetes mellitus, type 2) (HCC)    GERD (gastroesophageal reflux disease)    History of kidney stones    Hyperlipemia    Hypertension    Left leg pain    Vision changes    Wears hearing aid in both ears     Social History   Socioeconomic History   Marital status:  Married    Spouse name: Not on file   Number of children: 2   Years of education: Not on file   Highest education level: GED or equivalent  Occupational History   Occupation: retired  Tobacco Use   Smoking status: Former    Current packs/day: 0.00    Average packs/day: 1 pack/day for 25.0 years (25.0 ttl pk-yrs)    Types: Cigarettes    Start date: 09/08/1956    Quit date: 09/08/1981    Years since quitting: 41.7   Smokeless tobacco: Never  Vaping Use   Vaping status: Never Used  Substance and Sexual Activity   Alcohol use: Yes    Alcohol/week: 4.0 standard drinks of alcohol    Types: 4 Glasses of wine per week   Drug use: Never   Sexual activity: Not on file  Other Topics Concern   Not on file  Social History Narrative   Not on file   Social Determinants of Health   Financial Resource Strain: Low Risk  (04/14/2023)   Overall Financial Resource Strain (CARDIA)    Difficulty of Paying Living Expenses: Not hard at all  Food Insecurity: No Food Insecurity (04/14/2023)   Hunger Vital Sign    Worried About Running Out of Food in the Last Year: Never true  Ran Out of Food in the Last Year: Never true  Transportation Needs: No Transportation Needs (04/14/2023)   PRAPARE - Administrator, Civil Service (Medical): No    Lack of Transportation (Non-Medical): No  Physical Activity: Sufficiently Active (04/14/2023)   Exercise Vital Sign    Days of Exercise per Week: 3 days    Minutes of Exercise per Session: 60 min  Stress: No Stress Concern Present (04/14/2023)   [employer] of Occupational Health - Occupational Stress Questionnaire    Feeling of Stress : Not at all  Social Connections: Moderately Isolated (04/14/2023)   Social Connection and Isolation Panel [NHANES]    Frequency of Communication with Friends and Family: More than three times a week    Frequency of Social Gatherings with Friends and Family: More than three times a week    Attends Religious Services: Never     Database administrator or Organizations: No    Attends Banker Meetings: Never    Marital Status: Married  Catering manager Violence: Not At Risk (04/14/2023)   Humiliation, Afraid, Rape, and Kick questionnaire    Fear of Current or Ex-Partner: No    Emotionally Abused: No    Physically Abused: No    Sexually Abused: No    Past Surgical History:  Procedure Laterality Date   ABDOMINAL HYSTERECTOMY  1982   partial   APPENDECTOMY  1957   BACK SURGERY  07/12/2020   lower at [facility]   CYSTOSCOPY W/ URETERAL STENT PLACEMENT Right 10/06/2020   Procedure: CYSTOSCOPY WITH RETROGRADE PYELOGRAM/URETERAL STENT PLACEMENT;  Surgeon: Sebastian Ache, MD;  Location: [facility];  Service: Urology;  Laterality: Right;   CYSTOSCOPY WITH RETROGRADE PYELOGRAM, URETEROSCOPY AND STENT PLACEMENT Right 10/26/2020   Procedure: CYSTOSCOPY WITH RETROGRADE PYELOGRAM, URETEROSCOPY AND STENT REPLACEMENT;  Surgeon: Sebastian Ache, MD;  Location: [facility];  Service: Urology;  Laterality: Right;  90 MINS   HOLMIUM LASER APPLICATION Right 10/26/2020   Procedure: HOLMIUM LASER APPLICATION;  Surgeon: Sebastian Ache, MD;  Location: [facility];  Service: Urology;  Laterality: Right;   JOINT REPLACEMENT  2016   both knees partial replacement    Family History  Problem Relation Age of Onset   Liver disease Neg Hx    Esophageal cancer Neg Hx    Colon cancer Neg Hx     Allergies  Allergen Reactions   Diclofenac Swelling    1Suspected to have caused hives 2015-- had allergy testing  1Suspected to have caused hives 2015-- had allergy testing                                                    Tongue swelling  1Suspected to have caused hives 2015-- had allergy testing  1Suspected to have caused hives 2015-- had allergy testing                                                    Tongue swelling   Simvastatin     Mylagia    Metformin Other (See Comments)     Current Outpatient Medications on File Prior to Visit  Medication Sig Dispense Refill  acetaminophen (TYLENOL) 325 MG tablet      bisoprolol-hydrochlorothiazide (ZIAC) 2.5-6.25 MG tablet TAKE 1 TABLET BY MOUTH EVERY DAY 90 tablet 3   celecoxib (CELEBREX) 200 MG capsule TAKE 1 CAPSULE BY MOUTH EVERY DAY 90 capsule 1   cycloSPORINE (RESTASIS) 0.05 % ophthalmic emulsion      doxycycline (VIBRAMYCIN) 100 MG capsule Take 1 capsule (100 mg total) by mouth 2 (two) times daily. 14 capsule 0   FLAREX 0.1 % ophthalmic suspension Apply to eye.     fluticasone (FLONASE) 50 MCG/ACT nasal spray USE 2 SPRAYS IN EACH NOSTRIL EVERY DAY 16 g 5   glipiZIDE (GLUCOTROL XL) 2.5 MG 24 hr tablet TAKE 1 TABLET BY MOUTH EVERY DAY WITH breakfast 90 tablet 0   Glucosamine-Chondroit-Vit C-Mn (GLUCOSAMINE 1500 COMPLEX) CAPS Take 1 capsule by mouth daily.     JANUVIA 50 MG tablet TAKE 1 TABLET BY MOUTH EVERY DAY 90 tablet 0   Multiple Vitamins-Minerals (PRESERVISION AREDS PO) Take 1 tablet by mouth daily.     Polyvinyl Alcohol-Povidone PF (REFRESH) 1.4-0.6 % SOLN      Potassium 75 MG TABS potassium     pramipexole (MIRAPEX) 1 MG tablet Take 2 tablets (2 mg total) by mouth at bedtime. 180 tablet 1   sertraline (ZOLOFT) 50 MG tablet TAKE 1 TABLET BY MOUTH EVERY DAY 90 tablet 1   Tetrahydrozoline-Zn Sulfate (EYE DROPS A/C OP) Apply 1 drop to eye daily as needed (dry eyes).     triamcinolone cream (KENALOG) 0.1 % 1 application     triamterene-hydrochlorothiazide (MAXZIDE-25) 37.5-25 MG tablet TAKE 1 TABLET BY MOUTH EVERY DAY 90 tablet 3   UNABLE TO FIND Med Name: cbd oil to knees prn     vitamin B-12 (CYANOCOBALAMIN) 100 MCG tablet Vitamin B12     No current facility-administered medications on file prior to visit.    BP (!) 142/70   Pulse 70   Temp 98 F (36.7 C) (Oral)   Ht 5' (1.524 m)   Wt 177 lb (80.3 kg)   SpO2 94%   BMI 34.57 kg/m       Objective:   Physical Exam Vitals and nursing note reviewed.   Constitutional:      Appearance: Normal appearance.  Cardiovascular:     Rate and Rhythm: Normal rate and regular rhythm.     Pulses: Normal pulses.     Heart sounds: Normal heart sounds.  Pulmonary:     Effort: Pulmonary effort is normal.     Breath sounds: Normal breath sounds.  Musculoskeletal:        General: Normal range of motion.  Skin:    General: Skin is warm and dry.     Capillary Refill: Capillary refill takes less than 2 seconds.  Neurological:     General: No focal deficit present.     Mental Status: She is alert and oriented to person, place, and time.  Psychiatric:        Mood and Affect: Mood normal.        Behavior: Behavior normal.        Thought Content: Thought content normal.        Judgment: Judgment normal.       Assessment & Plan:   1. Diabetes mellitus treated with oral medication (HCC)  - POC HgB A1c- 6.8 - at goal. No change in medication  - glipiZIDE (GLUCOTROL XL) 2.5 MG 24 hr tablet; Take 1 tablet (2.5 mg total) by mouth daily with breakfast.  Dispense: 90 tablet; Refill: 1 - sitaGLIPtin (JANUVIA) 50 MG tablet; Take 1 tablet (50 mg total) by mouth daily.  Dispense: 90 tablet; Refill: 1 - POC HgB A1c  2. Primary hypertension - Controlled for age   61. Gait instability - Will refer to PT and then once she finishes PT she will hopefully feel more comfortable getting to the gym  - Ambulatory referral to Physical Therapy  4. Multiple joint pain  - Ambulatory referral to Physical Therapy   [name], NP

## 2023-05-25 ENCOUNTER — Ambulatory Visit (HOSPITAL_COMMUNITY)
Admission: RE | Admit: 2023-05-25 | Discharge: 2023-05-25 | Disposition: A | Discharge status: Home / Self Care | Payer: Medicare Other | Source: Ambulatory Visit | Attending: Nurse Practitioner | Admitting: Nurse Practitioner

## 2023-05-25 DIAGNOSIS — R1310 Dysphagia, unspecified: Secondary | ICD-10-CM | POA: Insufficient documentation

## 2023-05-25 NOTE — Therapy (Signed)
OUTPATIENT PHYSICAL THERAPY LOWER EXTREMITY EVALUATION   Patient Name: Ann Thompson MRN: [id number] DOB:1941/02/27, 82 y.o., female Today's Date: 05/26/2023  END OF SESSION:  PT End of Session - 05/26/23 1242     Visit Number 1    Date for PT Re-Evaluation 07/21/23    Authorization Type Medicare Part A and B- KX modifier needed after visit 15    Progress Note Due on Visit 10    PT Start Time 1150    PT Stop Time 1240    PT Time Calculation (min) 50 min    Equipment Utilized During Treatment Gait belt    Activity Tolerance Patient tolerated treatment well    Behavior During Therapy WFL for tasks assessed/performed             Past Medical History:  Diagnosis Date   Arthritis    DM type 2 (diabetes mellitus, type 2) (HCC)    GERD (gastroesophageal reflux disease)    History of kidney stones    Hyperlipemia    Hypertension    Left leg pain    Vision changes    Wears hearing aid in both ears    Past Surgical History:  Procedure Laterality Date   ABDOMINAL HYSTERECTOMY  1982   partial   APPENDECTOMY  1957   BACK SURGERY  07/12/2020   lower at [facility]   CYSTOSCOPY W/ URETERAL STENT PLACEMENT Right 10/06/2020   Procedure: CYSTOSCOPY WITH RETROGRADE PYELOGRAM/URETERAL STENT PLACEMENT;  Surgeon: Sebastian Ache, MD;  Location: [facility] OR;  Service: Urology;  Laterality: Right;   CYSTOSCOPY WITH RETROGRADE PYELOGRAM, URETEROSCOPY AND STENT PLACEMENT Right 10/26/2020   Procedure: CYSTOSCOPY WITH RETROGRADE PYELOGRAM, URETEROSCOPY AND STENT REPLACEMENT;  Surgeon: Sebastian Ache, MD;  Location: [facility];  Service: Urology;  Laterality: Right;  90 MINS   HOLMIUM LASER APPLICATION Right 10/26/2020   Procedure: HOLMIUM LASER APPLICATION;  Surgeon: Sebastian Ache, MD;  Location: [facility];  Service: Urology;  Laterality: Right;   JOINT REPLACEMENT  2016   both knees partial replacement   Patient Active Problem List   Diagnosis Date  Noted   Mass of lower limb 05/27/2021   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   Pain of left hip joint 12/25/2020   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   Ureteral stone 10/06/2020   Ureteral stone with hydronephrosis 10/06/2020   Lumbar radiculopathy 08/23/2020   Hypertension    Disc displacement, 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   Trochanteric bursitis of left hip 03/22/2020   Type 2 diabetes mellitus (HCC) 02/17/2020   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 She has been nervous to go there due to the obstacles she has to encounter. Her doctor wants her to get stronger in order for her to get back to her water aerobics classes. In the Spring she was consistent going to water aerobic twice a week. She has never fallen but she has a fear of falling. For exercise she walks in the  grocery store.  PERTINENT HISTORY: DM type 2, HTN, bilateral partial knee replacements 2016, hearing aids PAIN:  Are you having pain?  Achy pain in multiple joints (back, hip, shoulders)  PRECAUTIONS: Other: Patient has a fear of falling  RED FLAGS: None   WEIGHT BEARING RESTRICTIONS: No  FALLS:  Has patient fallen in last 6 months? No  LIVING ENVIRONMENT: Lives with: lives with their family and lives with their spouse Lives in: House/apartment Stairs:  She has stairs in her house but she does not use them. In the community she is able  to use do stairs with a railing if needed. Has following equipment at home:  Single [employer] she uses occasionally in the community  OCCUPATION: Retired; Leisure: water aerobics  PLOF: Independent  PATIENT GOALS: To gain confidence in her balance to get back to water aerobics class and the golf course. She has not golfed in 3 years.  NEXT MD VISIT: PRN  OBJECTIVE:   DIAGNOSTIC FINDINGS: None   COGNITION: Overall cognitive status: Within functional limits for tasks assessed     MUSCLE LENGTH: Hamstrings: Right 30 deg; Left 55 deg  Lumbar ROM: Flexion: fingers to legs   Passive ROM Right eval Left eval  Hip flexion    Hip extension    Hip abduction    Hip adduction    Hip internal rotation    Hip external rotation 60 30  Knee flexion 112 118  Knee extension    Ankle dorsiflexion    Ankle plantarflexion    Ankle inversion    Ankle eversion     (Blank rows = not tested)  LOWER EXTREMITY MMT:  MMT Right eval Left eval  Hip flexion 4- 4-  Hip extension    Hip abduction 4+ 4+  Hip adduction 4 4  Hip internal rotation    Hip external rotation    Knee flexion 4- 3+  Knee extension 4- 3+  Ankle dorsiflexion    Ankle plantarflexion    Ankle inversion    Ankle eversion     (Blank rows = not tested)    FUNCTIONAL TESTS: SL Balance: More balance and control Rt compared to Lt  5 times sit to stand: 15.17secs; using bilateral armrests Timed up and go (TUG): 12.74sec 6 minute walk test: 815ft Dynamic Gait Index: 14/24= risk of falls  GAIT: Distance walked: 856ft Assistive device utilized: None Level of assistance: Complete Independence Comments: short stride length, occasional scissor placement of Lt LE, looses balance when she turns her head   TODAY'S TREATMENT:                                                                                                                              DATE: 9/17/204  Completed Evaluation and Formed HEP  below  PATIENT EDUCATION:  Education details: Access Code: [email or web address] Person educated: Patient Education method: Explanation, Demonstration, and Handouts Education comprehension: verbalized understanding and needs further education  HOME  EXERCISE PROGRAM: Access Code: [email or web address] F URL: [email or web address] Date: 05/26/2023 Prepared by: Claude Manges  Exercises - Heel Raises with Counter Support  - 1 x daily - 7 x weekly - 1 sets - 10 reps - Sit to Stand with Armchair  - 1 x daily - 7 x weekly - 1 sets - 10 reps - Seated Long Arc Quad  - 1 x daily - 7 x weekly - 1 sets - 10 reps - 5 hold - Seated Toe Raise  - 1 x daily - 7 x weekly - 1 sets - 10 reps - Seated Heel Raise  - 1 x daily - 7 x weekly - 1 sets - 10 reps - Seated Knee Flexion with Anchored Resistance  - 1 x daily - 7 x weekly - 1 sets - 10 reps - Seated Hip Abduction with Resistance  - 1 x daily - 7 x weekly - 1 sets - 10 reps  ASSESSMENT:  CLINICAL IMPRESSION: Patient is a 82 y.o. female who was seen today for physical therapy evaluation and treatment for balance deficits. Patient has a fear of falling and has not been to her aerobic classes because of it. Patient has decreased Rt LE strength compared to Lt and when ambulated she has decreased balance with head turns. Patient's back pain and LE weakness seems to be limiting factors of tolerating PT treatment. Patient will benefit from skilled PT to address the below impairments and improve overall function.    OBJECTIVE IMPAIRMENTS: decreased activity tolerance, decreased balance, decreased endurance, decreased ROM, decreased strength, decreased safety awareness, impaired flexibility, and pain.   ACTIVITY LIMITATIONS: bending, standing, squatting, and stairs  PARTICIPATION LIMITATIONS: shopping and community activity  PERSONAL FACTORS: 1-2 comorbidities: HTN and Type 2 DM  are also affecting patient's functional outcome.   REHAB POTENTIAL: Good  CLINICAL  DECISION MAKING: Stable/uncomplicated  EVALUATION COMPLEXITY: Low   GOALS: Goals reviewed with patient? Yes  SHORT TERM GOALS: Target date: 06/23/2023   Patient will be compliant with initial HEP. Baseline: Goal status: INITIAL  2.  Patient will walk > or = 881ft during 6 minute walk test. Baseline: 882ft Goal status: INITIAL   LONG TERM GOALS: Target date: 07/21/2023    Patient will be compliant with advanced HEP. Baseline:  Goal status: INITIAL  2.  Patient will be able to safely attend aerobics classes at [facility]. Baseline:  Goal status: INITIAL  3.  Patient will achieve > or = to 16/24 on DGI to decrease risk of falls. Baseline: 14/24 Goal status: INITIAL  4.  Patient will walk > or = 89ft during 6 minute walk test. Baseline: 862ft Goal status: INITIAL     PLAN:  PT FREQUENCY: 2x/week  PT DURATION: 8 weeks  PLANNED INTERVENTIONS: Therapeutic exercises, Therapeutic activity, Neuromuscular re-education, Balance training, Gait training, Patient/Family education, Self Care, Joint mobilization, and Dry Needling  PLAN FOR NEXT SESSION: Assess HEP, strengthening, and balance training   Claude Manges, PT 05/26/23 1:16 PM

## 2023-05-26 ENCOUNTER — Ambulatory Visit: Payer: Medicare Other | Attending: Adult Health | Admitting: Physical Therapy

## 2023-05-26 ENCOUNTER — Encounter: Payer: Self-pay | Admitting: Physical Therapy

## 2023-05-26 ENCOUNTER — Other Ambulatory Visit: Payer: Self-pay

## 2023-05-26 ENCOUNTER — Telehealth: Payer: Self-pay | Admitting: Nurse Practitioner

## 2023-05-26 DIAGNOSIS — R2689 Other abnormalities of gait and mobility: Secondary | ICD-10-CM | POA: Diagnosis not present

## 2023-05-26 DIAGNOSIS — M6281 Muscle weakness (generalized): Secondary | ICD-10-CM | POA: Insufficient documentation

## 2023-05-26 DIAGNOSIS — R2681 Unsteadiness on feet: Secondary | ICD-10-CM | POA: Insufficient documentation

## 2023-05-26 DIAGNOSIS — M2550 Pain in unspecified joint: Secondary | ICD-10-CM | POA: Insufficient documentation

## 2023-05-26 NOTE — Telephone Encounter (Signed)
Spoke with pt.  Documented in result notes. Pt verbalized understanding with all questions answered.

## 2023-05-26 NOTE — Telephone Encounter (Signed)
PT is returning call to go over barium swallow results. Please advise.

## 2023-06-02 ENCOUNTER — Encounter: Payer: Self-pay | Admitting: Physical Therapy

## 2023-06-02 ENCOUNTER — Ambulatory Visit: Payer: Medicare Other | Admitting: Physical Therapy

## 2023-06-02 DIAGNOSIS — M6281 Muscle weakness (generalized): Secondary | ICD-10-CM | POA: Diagnosis not present

## 2023-06-02 DIAGNOSIS — R2689 Other abnormalities of gait and mobility: Secondary | ICD-10-CM

## 2023-06-02 NOTE — Therapy (Addendum)
OUTPATIENT PHYSICAL THERAPY LOWER EXTREMITY TREATMENT   Patient Name: Ann Thompson MRN: [id number] DOB:01-11-1941, 82 y.o., female Today's Date: 06/02/2023  END OF SESSION:  PT End of Session - 06/02/23 1456     Visit Number 2    Date for PT Re-Evaluation 07/21/23    Authorization Type Medicare Part A and B- KX modifier needed after visit 15    Progress Note Due on Visit 10    PT Start Time 1400    PT Stop Time 1443    PT Time Calculation (min) 43 min    Activity Tolerance Patient tolerated treatment well    Behavior During Therapy WFL for tasks assessed/performed              Past Medical History:  Diagnosis Date   Arthritis    DM type 2 (diabetes mellitus, type 2) (HCC)    GERD (gastroesophageal reflux disease)    History of kidney stones    Hyperlipemia    Hypertension    Left leg pain    Vision changes    Wears hearing aid in both ears    Past Surgical History:  Procedure Laterality Date   ABDOMINAL HYSTERECTOMY  1982   partial   APPENDECTOMY  1957   BACK SURGERY  07/12/2020   lower at [facility]   CYSTOSCOPY W/ URETERAL STENT PLACEMENT Right 10/06/2020   Procedure: CYSTOSCOPY WITH RETROGRADE PYELOGRAM/URETERAL STENT PLACEMENT;  Surgeon: Sebastian Ache, MD;  Location: [facility] OR;  Service: Urology;  Laterality: Right;   CYSTOSCOPY WITH RETROGRADE PYELOGRAM, URETEROSCOPY AND STENT PLACEMENT Right 10/26/2020   Procedure: CYSTOSCOPY WITH RETROGRADE PYELOGRAM, URETEROSCOPY AND STENT REPLACEMENT;  Surgeon: Sebastian Ache, MD;  Location: [facility];  Service: Urology;  Laterality: Right;  90 MINS   HOLMIUM LASER APPLICATION Right 10/26/2020   Procedure: HOLMIUM LASER APPLICATION;  Surgeon: Sebastian Ache, MD;  Location: [facility];  Service: Urology;  Laterality: Right;   JOINT REPLACEMENT  2016   both knees partial replacement   Patient Active Problem List   Diagnosis Date Noted   Mass of lower limb 05/27/2021   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/2021   Pain of left hip joint 12/25/2020   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   Ureteral stone 10/06/2020   Ureteral stone with hydronephrosis 10/06/2020   Lumbar radiculopathy 08/23/2020   Hypertension    Disc displacement, 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   Trochanteric bursitis of left hip 03/22/2020   Type 2 diabetes mellitus (HCC) 02/17/2020   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 She is a little sore after hitting golf balls at the golf course on Friday. She has been compliant with HEP. Her back pain is currently 8/10.  PERTINENT HISTORY: DM type 2, HTN, bilateral partial knee replacements 2016, hearing aids PAIN:  Are you having pain?  Achy pain in multiple joints (back, hip, shoulders)  PRECAUTIONS: Other: Patient has a fear of falling  RED FLAGS: None   WEIGHT BEARING RESTRICTIONS: No  FALLS:  Has patient fallen in last 6 months? No  LIVING ENVIRONMENT: Lives with: lives with their family and lives with their spouse Lives in: House/apartment Stairs:  She has stairs in her house but she does not use them. In the community she is able to use do stairs with a railing if needed. Has following equipment at home:  Single [employer] she uses occasionally in the community  OCCUPATION: Retired; Leisure: water aerobics  PLOF: Independent  PATIENT GOALS: To gain confidence in her balance to get back to water  aerobics class and the golf course. She has not golfed in 3 years.  NEXT MD VISIT: PRN  OBJECTIVE:   DIAGNOSTIC FINDINGS: None   COGNITION: Overall cognitive status: Within functional limits for tasks assessed     MUSCLE LENGTH: Hamstrings: Right 30 deg; Left 55 deg  Lumbar ROM: Flexion: fingers to legs   Passive ROM Right eval Left eval  Hip flexion    Hip extension    Hip abduction    Hip adduction    Hip internal rotation    Hip external rotation 60 30  Knee flexion 112 118  Knee extension    Ankle dorsiflexion    Ankle plantarflexion    Ankle inversion    Ankle eversion     (Blank rows = not tested)  LOWER EXTREMITY MMT:  MMT Right eval Left eval  Hip flexion 4- 4-  Hip extension    Hip abduction 4+ 4+  Hip adduction 4 4  Hip internal rotation    Hip external rotation    Knee flexion 4- 3+  Knee extension 4- 3+  Ankle dorsiflexion    Ankle plantarflexion    Ankle inversion    Ankle eversion     (Blank rows = not tested)    FUNCTIONAL TESTS: SL Balance: More balance and control Rt compared to Lt  5 times sit to stand: 15.17secs; using bilateral armrests Timed up and go (TUG): 12.74sec 6 minute walk test: 864ft Dynamic Gait Index: 14/24= risk of falls  GAIT: Distance walked: 840ft Assistive device utilized: None Level of assistance: Complete Independence Comments: short stride length, occasional scissor placement of Lt LE, looses balance when she turns her head   TODAY'S TREATMENT:                                                                                                                              DATE:   06/02/2023 Nu Step Level 2 x 6 minutes. Pt present to discuss HEP & progression Reviewed HEP exercises Standing hip series at barre (hip flexion, abduction, extension, LAQ) with 2lb ankle weights x10 each leg Mini squats at barre x10 Step Taps 6 inch step x20 Forward and Side steps on airex x10 each direction 4 square step test  (clockwise and counterclockwise) Cone Weaving x3 Tandem on Airex at barre Rt & Lt LE in front - goal was 30secs patient was only able to hold 10-15secs  Manual : Attaday to bilateral low back     9/17/204  Completed Evaluation and Formed HEP below  PATIENT EDUCATION:  Education details: Access Code: [email or web address] F Person educated: Patient Education method: Explanation, Demonstration, and Handouts Education comprehension: verbalized understanding and needs further education  HOME EXERCISE PROGRAM: Access Code: [email or web address] F URL: [email or web address] Date: 05/26/2023 Prepared by: Claude Manges  Exercises - Heel Raises with Counter Support  - 1 x daily - 7 x weekly - 1 sets - 10 reps - Sit to Stand with Armchair  - 1 x daily - 7 x weekly - 1 sets - 10 reps - Seated Long Arc Quad  - 1 x daily - 7 x weekly - 1 sets - 10 reps - 5 hold - Seated Toe Raise  - 1 x daily - 7 x weekly - 1 sets - 10 reps - Seated Heel Raise  - 1 x daily - 7 x weekly - 1 sets - 10 reps - Seated Knee Flexion with Anchored Resistance  - 1 x daily - 7 x weekly - 1 sets - 10 reps - Seated Hip Abduction with Resistance  - 1 x daily - 7 x weekly - 1 sets - 10 reps  ASSESSMENT:  CLINICAL IMPRESSION: Today's treatment session focused on LE strengthening and balance. Reviewed patient's HEP exercises and patient verbalized she has been compliant with them. Patient required verbal and visual cues for correct squatting form. Incorporated unstable surface with balance exercises. With tandem on airex patient was only able to hold position for 10-15 secs before requiring UE support to maintain balance. While performing step taps with 6 inch step patient caught foot on step but was able to maintain balance. Towards end of treatment session patient had increased back pain which responded favorably to manual therapy. Patient will benefit from skilled PT to address the below impairments and improve overall function.   OBJECTIVE  IMPAIRMENTS: decreased activity tolerance, decreased balance, decreased endurance, decreased ROM, decreased strength, decreased safety awareness, impaired flexibility, and pain.   ACTIVITY LIMITATIONS: bending, standing, squatting, and stairs  PARTICIPATION LIMITATIONS: shopping and community activity  PERSONAL FACTORS: 1-2 comorbidities: HTN and Type 2 DM  are also affecting patient's functional outcome.   REHAB POTENTIAL: Good  CLINICAL DECISION MAKING: Stable/uncomplicated  EVALUATION COMPLEXITY: Low   GOALS: Goals reviewed with patient? Yes  SHORT TERM GOALS: Target date: 06/23/2023   Patient will be compliant with initial HEP. Baseline: Goal status: INITIAL  2.  Patient will walk > or = 83ft during 6 minute walk test. Baseline: 859ft Goal status: INITIAL   LONG TERM GOALS: Target date: 07/21/2023    Patient will be compliant with advanced HEP. Baseline:  Goal status: INITIAL  2.  Patient will be able to safely attend aerobics classes at [facility]. Baseline:  Goal status: INITIAL  3.  Patient will achieve > or = to 16/24 on DGI to decrease risk of falls. Baseline: 14/24 Goal status: INITIAL  4.  Patient will walk > or = 865ft during 6 minute walk test. Baseline: 837ft Goal status: INITIAL     PLAN:  PT FREQUENCY: 2x/week  PT DURATION: 8 weeks  PLANNED INTERVENTIONS: Therapeutic exercises, Therapeutic activity, Neuromuscular re-education, Balance training, Gait training, Patient/Family education, Self Care, Joint mobilization, and Dry Needling  PLAN FOR NEXT SESSION: Continue LE strengthening, airex balance, core strengthening   Claude Manges, PT 06/02/23 3:07 PM

## 2023-06-04 ENCOUNTER — Encounter: Payer: Self-pay | Admitting: Physical Therapy

## 2023-06-04 ENCOUNTER — Ambulatory Visit: Payer: Medicare Other | Admitting: Physical Therapy

## 2023-06-04 DIAGNOSIS — R2689 Other abnormalities of gait and mobility: Secondary | ICD-10-CM

## 2023-06-04 DIAGNOSIS — M6281 Muscle weakness (generalized): Secondary | ICD-10-CM

## 2023-06-04 NOTE — Therapy (Signed)
OUTPATIENT PHYSICAL THERAPY LOWER EXTREMITY TREATMENT   Patient Name: Ann Thompson MRN: [id number] DOB:[birth year], 82 y.o., female Today's Date: 06/04/2023  END OF SESSION:  PT End of Session - 06/04/23 1104     Visit Number 3    Date for PT Re-Evaluation 07/21/23    Authorization Type Medicare Part A and B- KX modifier needed after visit 15    Progress Note Due on Visit 10    PT Start Time 1100    PT Stop Time 1143    PT Time Calculation (min) 43 min    Activity Tolerance Patient tolerated treatment well    Behavior During Therapy WFL for tasks assessed/performed               Past Medical History:  Diagnosis Date   Arthritis    DM type 2 (diabetes mellitus, type 2) (HCC)    GERD (gastroesophageal reflux disease)    History of kidney stones    Hyperlipemia    Hypertension    Left leg pain    Vision changes    Wears hearing aid in both ears    Past Surgical History:  Procedure Laterality Date   ABDOMINAL HYSTERECTOMY  1982   partial   APPENDECTOMY  1957   BACK SURGERY  07/12/2020   lower at [facility]   CYSTOSCOPY W/ URETERAL STENT PLACEMENT Right 10/06/2020   Procedure: CYSTOSCOPY WITH RETROGRADE PYELOGRAM/URETERAL STENT PLACEMENT;  Surgeon: Sebastian Ache, MD;  Location: [facility] OR;  Service: Urology;  Laterality: Right;   CYSTOSCOPY WITH RETROGRADE PYELOGRAM, URETEROSCOPY AND STENT PLACEMENT Right 10/26/2020   Procedure: CYSTOSCOPY WITH RETROGRADE PYELOGRAM, URETEROSCOPY AND STENT REPLACEMENT;  Surgeon: Sebastian Ache, MD;  Location: [facility];  Service: Urology;  Laterality: Right;  90 MINS   HOLMIUM LASER APPLICATION Right 10/26/2020   Procedure: HOLMIUM LASER APPLICATION;  Surgeon: Sebastian Ache, MD;  Location: [facility];  Service: Urology;  Laterality: Right;   JOINT REPLACEMENT  2016   both knees partial replacement   Patient Active Problem List   Diagnosis Date Noted   Mass of lower limb 05/27/2021   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/2021   Pain of left hip joint 12/25/2020   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   Ureteral stone 10/06/2020   Ureteral stone with hydronephrosis 10/06/2020   Lumbar radiculopathy 08/23/2020   Hypertension    Disc displacement, 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   Trochanteric bursitis of left hip 03/22/2020   Type 2 diabetes mellitus (HCC) 02/17/2020   Status post bilateral knee replacements 06/07/2015   S/P bilateral unicompartmental knee replacement 03/01/2015    PCP: [name], NPRef Provider (PCP)   REFERRING PROVIDER: [name], Frazier Richards Provider (PCP)   REFERRING DIAG:  R26.81 (ICD-10-CM) - Gait instability  M25.50 (ICD-10-CM) - Multiple joint pain    THERAPY DIAG:  Muscle weakness (generalized)  Other abnormalities of gait and mobility  Rationale for Evaluation and Treatment: Rehabilitation  ONSET DATE: Gradual over a year  SUBJECTIVE:   SUBJECTIVE STATEMENT: My back is sore and this feels like an early appointment.  My body doesn't work well in the morning.  PERTINENT HISTORY: DM type 2, HTN, bilateral partial knee replacements 2016, hearing aids PAIN:  Are you having pain?  Achy pain in multiple joints (back, hip, shoulders)  PRECAUTIONS: Other: Patient has a fear of falling  RED FLAGS: None   WEIGHT BEARING RESTRICTIONS: No  FALLS:  Has patient fallen in last 6 months? No  LIVING ENVIRONMENT: Lives with:  lives with their family and lives with their spouse Lives in: House/apartment Stairs:  She has stairs in her house but she does not use them. In the community she is able to use do stairs with a railing if needed. Has following equipment at home:  Single [employer] she uses occasionally in the community  OCCUPATION: Retired; Leisure: water aerobics  PLOF: Independent  PATIENT GOALS: To gain confidence in her balance to get back to water aerobics class and the golf course. She has not golfed in 3 years.  NEXT MD VISIT:  PRN  OBJECTIVE:   DIAGNOSTIC FINDINGS: None   COGNITION: Overall cognitive status: Within functional limits for tasks assessed     MUSCLE LENGTH: Hamstrings: Right 30 deg; Left 55 deg  Lumbar ROM: Flexion: fingers to legs   Passive ROM Right eval Left eval  Hip flexion    Hip extension    Hip abduction    Hip adduction    Hip internal rotation    Hip external rotation 60 30  Knee flexion 112 118  Knee extension    Ankle dorsiflexion    Ankle plantarflexion    Ankle inversion    Ankle eversion     (Blank rows = not tested)  LOWER EXTREMITY MMT:  MMT Right eval Left eval  Hip flexion 4- 4-  Hip extension    Hip abduction 4+ 4+  Hip adduction 4 4  Hip internal rotation    Hip external rotation    Knee flexion 4- 3+  Knee extension 4- 3+  Ankle dorsiflexion    Ankle plantarflexion    Ankle inversion    Ankle eversion     (Blank rows = not tested)    FUNCTIONAL TESTS: SL Balance: More balance and control Rt compared to Lt  5 times sit to stand: 15.17secs; using bilateral armrests Timed up and go (TUG): 12.74sec 6 minute walk test: 820ft Dynamic Gait Index: 14/24= risk of falls  GAIT: Distance walked: 836ft Assistive device utilized: None Level of assistance: Complete Independence Comments: short stride length, occasional scissor placement of Lt LE, looses balance when she turns her head   TODAY'S TREATMENT:                                                                                                                              DATE:  06/04/23: Supine LTR x 10 Supine Fig 4 x20" bil Supine PPT x10 Attempted bridges but HS cramps Supine hip flexion isometric 5x5" alt LE  Seated heel/toe raises x20 LAQ 1x10 bil Sit to stand from chair with airex pad under feet x5 Hurdles in parallel bars step to pattern Rt lead and Lt lead x2 rounds, sidestep x2 rounds with bil UE support Standing hip series at barre (hip flexion, abduction, extension) with  2lb ankle weights x10 each leg 4 square step test (clockwise and counterclockwise) x 5 boxes each way Fwd step with contralateral UE reach x10 each  side Airex balance beam in parallel bars x 4 passes, then tandem balance with each foot position 2x30" with intermittent UE support on bil rails NuStep L5 x 6' PT present to monitor  06/02/2023 Nu Step Level 2 x 6 minutes. Pt present to discuss HEP & progression Reviewed HEP exercises Standing hip series at barre (hip flexion, abduction, extension, LAQ) with 2lb ankle weights x10 each leg Mini squats at barre x10 Step Taps 6 inch step x20 Forward and Side steps on airex x10 each direction 4 square step test (clockwise and counterclockwise) Cone Weaving x3 Tandem on Airex at barre Rt & Lt LE in front - goal was 30secs patient was only able to hold 10-15secs Manual : Attaday to bilateral low back     9/17/204  Completed Evaluation and Formed HEP below  PATIENT EDUCATION:  Education details: Access Code: [email or web address] F Person educated: Patient Education method: Explanation, Demonstration, and Handouts Education comprehension: verbalized understanding and needs further education  HOME EXERCISE PROGRAM: Access Code: [email or web address] F URL: [email or web address] Date: 05/26/2023 Prepared by: Claude Manges  Exercises - Heel Raises with Counter Support  - 1 x daily - 7 x weekly - 1 sets - 10 reps - Sit to Stand with Armchair  - 1 x daily - 7 x weekly - 1 sets - 10 reps - Seated Long Arc Quad  - 1 x daily - 7 x weekly - 1 sets - 10 reps - 5 hold - Seated Toe Raise  - 1 x daily - 7 x weekly - 1 sets - 10 reps - Seated Heel Raise  - 1 x daily - 7 x weekly - 1 sets - 10 reps - Seated Knee Flexion with Anchored Resistance  - 1 x daily - 7 x weekly - 1 sets - 10 reps - Seated Hip Abduction with Resistance  - 1 x daily - 7 x weekly - 1 sets - 10 reps  ASSESSMENT:  CLINICAL IMPRESSION: Pt had an 11am session today and reported this feels early  for her body to function.  We started with some supine basic warm up mobilty and stretches and progressed into compliant surface balance challenges, hurdle multi-directional step overs, closely supervised box stepping and step with UE reach, and loaded hip and knee strength.  All therex was well tolerated.  Pt needs intermittent UE support in tandem balance on airex pad.  She is motivated and willing.  PT closely monitored for joint pain within session and provided close supervision and intermittent CGA with small LOB with fwd step with UE reach today.   OBJECTIVE IMPAIRMENTS: decreased activity tolerance, decreased balance, decreased endurance, decreased ROM, decreased strength, decreased safety awareness, impaired flexibility, and pain.   ACTIVITY LIMITATIONS: bending, standing, squatting, and stairs  PARTICIPATION LIMITATIONS: shopping and community activity  PERSONAL FACTORS: 1-2 comorbidities: HTN and Type 2 DM  are also affecting patient's functional outcome.   REHAB POTENTIAL: Good  CLINICAL DECISION MAKING: Stable/uncomplicated  EVALUATION COMPLEXITY: Low   GOALS: Goals reviewed with patient? Yes  SHORT TERM GOALS: Target date: 06/23/2023   Patient will be compliant with initial HEP. Baseline: Goal status: INITIAL  2.  Patient will walk > or = 825ft during 6 minute walk test. Baseline: 853ft Goal status: INITIAL   LONG TERM GOALS: Target date: 07/21/2023    Patient will be compliant with advanced HEP. Baseline:  Goal status: INITIAL  2.  Patient will be able to safely attend aerobics classes at [facility]. Baseline:  Goal status: INITIAL  3.  Patient will achieve > or = to 16/24 on DGI to decrease risk of falls. Baseline: 14/24 Goal status: INITIAL  4.  Patient will walk > or = 879ft during 6 minute walk test. Baseline: 841ft Goal status: INITIAL     PLAN:  PT FREQUENCY: 2x/week  PT DURATION: 8 weeks  PLANNED INTERVENTIONS: Therapeutic exercises,  Therapeutic activity, Neuromuscular re-education, Balance training, Gait training, Patient/Family education, Self Care, Joint mobilization, and Dry Needling  PLAN FOR NEXT SESSION: Continue LE strengthening, airex balance, core strengthening   Amando Ishikawa, PT 06/04/23 11:44 AM

## 2023-06-08 ENCOUNTER — Ambulatory Visit: Payer: Medicare Other | Admitting: Physical Therapy

## 2023-06-08 ENCOUNTER — Encounter: Payer: Self-pay | Admitting: Physical Therapy

## 2023-06-08 DIAGNOSIS — R2689 Other abnormalities of gait and mobility: Secondary | ICD-10-CM

## 2023-06-08 DIAGNOSIS — M6281 Muscle weakness (generalized): Secondary | ICD-10-CM

## 2023-06-08 NOTE — Therapy (Addendum)
OUTPATIENT PHYSICAL THERAPY LOWER EXTREMITY TREATMENT   Patient Name: Ann Thompson MRN: [id number] DOB:December 12, 1940, 82 y.o., female Today's Date: 06/08/2023  END OF SESSION:  PT End of Session - 06/08/23 1452     Visit Number 4    Date for PT Re-Evaluation 07/21/23    Authorization Type Medicare Part A and B- KX modifier needed after visit 15    Progress Note Due on Visit 10    PT Start Time 1400    PT Stop Time 1440    PT Time Calculation (min) 40 min    Activity Tolerance Patient tolerated treatment well    Behavior During Therapy WFL for tasks assessed/performed                Past Medical History:  Diagnosis Date   Arthritis    DM type 2 (diabetes mellitus, type 2) (HCC)    GERD (gastroesophageal reflux disease)    History of kidney stones    Hyperlipemia    Hypertension    Left leg pain    Vision changes    Wears hearing aid in both ears    Past Surgical History:  Procedure Laterality Date   ABDOMINAL HYSTERECTOMY  1982   partial   APPENDECTOMY  1957   BACK SURGERY  07/12/2020   lower at [facility]   CYSTOSCOPY W/ URETERAL STENT PLACEMENT Right 10/06/2020   Procedure: CYSTOSCOPY WITH RETROGRADE PYELOGRAM/URETERAL STENT PLACEMENT;  Surgeon: Sebastian Ache, MD;  Location: [facility] OR;  Service: Urology;  Laterality: Right;   CYSTOSCOPY WITH RETROGRADE PYELOGRAM, URETEROSCOPY AND STENT PLACEMENT Right 10/26/2020   Procedure: CYSTOSCOPY WITH RETROGRADE PYELOGRAM, URETEROSCOPY AND STENT REPLACEMENT;  Surgeon: Sebastian Ache, MD;  Location: [facility];  Service: Urology;  Laterality: Right;  90 MINS   HOLMIUM LASER APPLICATION Right 10/26/2020   Procedure: HOLMIUM LASER APPLICATION;  Surgeon: Sebastian Ache, MD;  Location: [facility];  Service: Urology;  Laterality: Right;   JOINT REPLACEMENT  2016   both knees partial replacement   Patient Active Problem List   Diagnosis Date Noted   Mass of lower limb 05/27/2021   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/2021   Pain of left hip joint 12/25/2020   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   Ureteral stone 10/06/2020   Ureteral stone with hydronephrosis 10/06/2020   Lumbar radiculopathy 08/23/2020   Hypertension    Disc displacement, 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   Trochanteric bursitis of left hip 03/22/2020   Type 2 diabetes mellitus (HCC) 02/17/2020   Status post bilateral knee replacements 06/07/2015   S/P bilateral unicompartmental knee replacement 03/01/2015    PCP: [name], NPRef Provider (PCP)   REFERRING PROVIDER: [name], Frazier Richards Provider (PCP)   REFERRING DIAG:  R26.81 (ICD-10-CM) - Gait instability  M25.50 (ICD-10-CM) - Multiple joint pain    THERAPY DIAG:  Muscle weakness (generalized)  Other abnormalities of gait and mobility  Rationale for Evaluation and Treatment: Rehabilitation  ONSET DATE: Gradual over a year  SUBJECTIVE:   SUBJECTIVE STATEMENT: My back and legs are hurting.  Leg pain feels sore, achy, and weak. It is hard to put a number on it.  PERTINENT HISTORY: DM type 2, HTN, bilateral partial knee replacements 2016, hearing aids PAIN:  Are you having pain?  Achy pain in multiple joints (back, hip, shoulders)  PRECAUTIONS: Other: Patient has a fear of falling  RED FLAGS: None   WEIGHT BEARING RESTRICTIONS: No  FALLS:  Has patient fallen in last 6 months? No  LIVING ENVIRONMENT: Lives with: lives with their family and lives with their spouse Lives in: House/apartment Stairs:  She has stairs in her house but she does not use them. In the community she is able to use do stairs with a railing if needed. Has following equipment at home:  Single [employer] she uses occasionally in the community  OCCUPATION: Retired; Leisure: water aerobics  PLOF: Independent  PATIENT GOALS: To gain confidence in her balance to get back to water aerobics class and the golf course. She has not golfed in 3 years.  NEXT MD VISIT:  PRN  OBJECTIVE:   DIAGNOSTIC FINDINGS: None   COGNITION: Overall cognitive status: Within functional limits for tasks assessed     MUSCLE LENGTH: Hamstrings: Right 30 deg; Left 55 deg  Lumbar ROM: Flexion: fingers to legs   Passive ROM Right eval Left eval  Hip flexion    Hip extension    Hip abduction    Hip adduction    Hip internal rotation    Hip external rotation 60 30  Knee flexion 112 118  Knee extension    Ankle dorsiflexion    Ankle plantarflexion    Ankle inversion    Ankle eversion     (Blank rows = not tested)  LOWER EXTREMITY MMT:  MMT Right eval Left eval  Hip flexion 4- 4-  Hip extension    Hip abduction 4+ 4+  Hip adduction 4 4  Hip internal rotation    Hip external rotation    Knee flexion 4- 3+  Knee extension 4- 3+  Ankle dorsiflexion    Ankle plantarflexion    Ankle inversion    Ankle eversion     (Blank rows = not tested)    FUNCTIONAL TESTS: SL Balance: More balance and control Rt compared to Lt  5 times sit to stand: 15.17secs; using bilateral armrests Timed up and go (TUG): 12.74sec 6 minute walk test: 830ft Dynamic Gait Index: 14/24= risk of falls  GAIT: Distance walked: 880ft Assistive device utilized: None Level of assistance: Complete Independence Comments: short stride length, occasional scissor placement of Lt LE, looses balance when she turns her head   TODAY'S TREATMENT:                                                                                                                              DATE:  06/08/23: NuStep L3 x 6' PT present to monitor & discuss progress Standing hip series at barre (hip flexion, abduction, extension) with 2lb ankle weights 2x10 each leg Standing Punches 2 lb 2 x 10 Standing shoulder flexion 2 x 10 2 lb Seated LAQ 2# ankle weight x 12 each LE Sit to stand from chair with airex pad under feet x5 Standing Heel raises x 15 4 square step test (clockwise and counterclockwise) x 5 boxes  each way Leg Press seat 5; bilateral 40 lbs 2 x 10; unilateral 15 lbs 2 x 10 Seated  Biceps Curl 2 x 10 2 lb DB Airex Step Ups & Side Steps x10 Seated hamstring S 2 x 30 sec each Seated 3-way stability ball stretch x 5 each way   06/04/23: Supine LTR x 10 Supine Fig 4 x20" bil Supine PPT x10 Attempted bridges but HS cramps Supine hip flexion isometric 5x5" alt LE  Seated heel/toe raises x20 LAQ 1x10 bil Sit to stand from chair with airex pad under feet x5 Hurdles in parallel bars step to pattern Rt lead and Lt lead x2 rounds, sidestep x2 rounds with bil UE support Standing hip series at barre (hip flexion, abduction, extension) with 2lb ankle weights x10 each leg 4 square step test (clockwise and counterclockwise) x 5 boxes each way Fwd step with contralateral UE reach x10 each side Airex balance beam in parallel bars x 4 passes, then tandem balance with each foot position 2x30" with intermittent UE support on bil rails NuStep L5 x 6' PT present to monitor  06/02/2023 Nu Step Level 2 x 6 minutes. Pt present to discuss HEP & progression Reviewed HEP exercises Standing hip series at barre (hip flexion, abduction, extension, LAQ) with 2lb ankle weights x10 each leg Mini squats at barre x10 Step Taps 6 inch step x20 Forward and Side steps on airex x10 each direction 4 square step test (clockwise and counterclockwise) [employer] Weaving x3 Tandem on Airex at barre Rt & Lt LE in front - goal was 30secs patient was only able to hold 10-15secs Manual : Attaday to bilateral low back     9/17/204  Completed Evaluation and Formed HEP below  PATIENT EDUCATION:  Education details: Access Code: [email or web address] F Person educated: Patient Education method: Explanation, Demonstration, and Handouts Education comprehension: verbalized understanding and needs further education  HOME EXERCISE PROGRAM: Access Code: [email or web address] F URL: [email or web address] Date: 05/26/2023 Prepared by: Claude Manges  Exercises - Heel Raises with Counter Support  - 1 x daily - 7 x weekly - 1 sets - 10 reps - Sit to Stand with Armchair  - 1 x daily - 7 x weekly - 1 sets - 10 reps - Seated Long Arc Quad  - 1 x daily - 7 x weekly - 1 sets - 10 reps - 5 hold - Seated Toe Raise  - 1 x daily - 7 x weekly - 1 sets - 10 reps - Seated Heel Raise  - 1 x daily - 7 x weekly - 1 sets - 10 reps - Seated Knee Flexion with Anchored Resistance  - 1 x daily - 7 x weekly - 1 sets - 10 reps - Seated Hip Abduction with Resistance  - 1 x daily - 7 x weekly - 1 sets - 10 reps  ASSESSMENT:  CLINICAL IMPRESSION: Today's treatment session focused on overall upper and lower body strengthening. Patient verbalized she wanted to focus on her strength today. Patient tolerated treatment session well and did not verbalized any increased pain. Patient was able to tolerate increased reps with standing hip exercises; required verbal cues to stand upright while performing exercises. Patient will benefit from skilled PT to address the below impairments and improve overall function.    OBJECTIVE IMPAIRMENTS: decreased activity tolerance, decreased balance, decreased endurance, decreased ROM, decreased strength, decreased safety awareness, impaired flexibility, and pain.   ACTIVITY LIMITATIONS: bending, standing, squatting, and stairs  PARTICIPATION LIMITATIONS: shopping and community activity  PERSONAL FACTORS: 1-2 comorbidities: HTN and Type 2 DM  are also affecting patient's functional outcome.   REHAB POTENTIAL: Good  CLINICAL DECISION MAKING: Stable/uncomplicated  EVALUATION COMPLEXITY: Low   GOALS: Goals reviewed with patient? Yes  SHORT TERM GOALS: Target date: 06/23/2023   Patient will be compliant with initial HEP. Baseline: Goal status: INITIAL  2.  Patient will walk > or = 858ft during 6 minute walk test. Baseline: 827ft Goal status: INITIAL   LONG TERM GOALS: Target date: 07/21/2023    Patient will  be compliant with advanced HEP. Baseline:  Goal status: INITIAL  2.  Patient will be able to safely attend aerobics classes at [facility]. Baseline:  Goal status: INITIAL  3.  Patient will achieve > or = to 16/24 on DGI to decrease risk of falls. Baseline: 14/24 Goal status: INITIAL  4.  Patient will walk > or = 810ft during 6 minute walk test. Baseline: 856ft Goal status: INITIAL     PLAN:  PT FREQUENCY: 2x/week  PT DURATION: 8 weeks  PLANNED INTERVENTIONS: Therapeutic exercises, Therapeutic activity, Neuromuscular re-education, Balance training, Gait training, Patient/Family education, Self Care, Joint mobilization, and Dry Needling  PLAN FOR NEXT SESSION: Continue LE strengthening, balance while walking   Claude Manges, PT 06/08/23 2:53 PM

## 2023-06-09 ENCOUNTER — Other Ambulatory Visit: Payer: Self-pay | Admitting: Adult Health

## 2023-06-09 DIAGNOSIS — L299 Pruritus, unspecified: Secondary | ICD-10-CM

## 2023-06-09 DIAGNOSIS — E119 Type 2 diabetes mellitus without complications: Secondary | ICD-10-CM

## 2023-06-10 NOTE — Telephone Encounter (Signed)
he original prescription was discontinued on 12/02/2022 by [name], NP. Renewing this prescription may not be appropriate.   Does pt need to continue medication?

## 2023-06-11 ENCOUNTER — Encounter: Payer: Self-pay | Admitting: Physical Therapy

## 2023-06-11 ENCOUNTER — Ambulatory Visit: Payer: Medicare Other | Attending: Adult Health | Admitting: Physical Therapy

## 2023-06-11 DIAGNOSIS — R2689 Other abnormalities of gait and mobility: Secondary | ICD-10-CM | POA: Diagnosis present

## 2023-06-11 DIAGNOSIS — M6281 Muscle weakness (generalized): Secondary | ICD-10-CM | POA: Insufficient documentation

## 2023-06-11 NOTE — Therapy (Addendum)
OUTPATIENT PHYSICAL THERAPY LOWER EXTREMITY TREATMENT   Patient Name: Ann Thompson MRN: [id number] DOB:1940/12/26, 82 y.o., female Today's Date: 06/11/2023  END OF SESSION:  PT End of Session - 06/11/23 1201     Visit Number 5    Date for PT Re-Evaluation 07/21/23    Authorization Type Medicare Part A and B- KX modifier needed after visit 15    Progress Note Due on Visit 10    PT Start Time 1100    PT Stop Time 1140    PT Time Calculation (min) 40 min    Activity Tolerance Patient tolerated treatment well    Behavior During Therapy WFL for tasks assessed/performed                 Past Medical History:  Diagnosis Date   Arthritis    DM type 2 (diabetes mellitus, type 2) (HCC)    GERD (gastroesophageal reflux disease)    History of kidney stones    Hyperlipemia    Hypertension    Left leg pain    Vision changes    Wears hearing aid in both ears    Past Surgical History:  Procedure Laterality Date   ABDOMINAL HYSTERECTOMY  1982   partial   APPENDECTOMY  1957   BACK SURGERY  07/12/2020   lower at [facility]   CYSTOSCOPY W/ URETERAL STENT PLACEMENT Right 10/06/2020   Procedure: CYSTOSCOPY WITH RETROGRADE PYELOGRAM/URETERAL STENT PLACEMENT;  Surgeon: Sebastian Ache, MD;  Location: [facility] OR;  Service: Urology;  Laterality: Right;   CYSTOSCOPY WITH RETROGRADE PYELOGRAM, URETEROSCOPY AND STENT PLACEMENT Right 10/26/2020   Procedure: CYSTOSCOPY WITH RETROGRADE PYELOGRAM, URETEROSCOPY AND STENT REPLACEMENT;  Surgeon: Sebastian Ache, MD;  Location: [facility];  Service: Urology;  Laterality: Right;  90 MINS   HOLMIUM LASER APPLICATION Right 10/26/2020   Procedure: HOLMIUM LASER APPLICATION;  Surgeon: Sebastian Ache, MD;  Location: [facility];  Service: Urology;  Laterality: Right;   JOINT REPLACEMENT  2016   both knees partial replacement   Patient Active Problem List   Diagnosis Date Noted   Mass of lower limb 05/27/2021    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   Pain of left hip joint 12/25/2020   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   Ureteral stone 10/06/2020   Ureteral stone with hydronephrosis 10/06/2020   Lumbar radiculopathy 08/23/2020   Hypertension    Disc displacement, 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   Trochanteric bursitis of left hip 03/22/2020   Type 2 diabetes mellitus (HCC) 02/17/2020   Status post bilateral knee replacements 06/07/2015   S/P bilateral unicompartmental knee replacement 03/01/2015    PCP: [name], NPRef Provider (PCP)   REFERRING PROVIDER: [name], Frazier Richards Provider (PCP)   REFERRING DIAG:  R26.81 (ICD-10-CM) - Gait instability  M25.50 (ICD-10-CM) - Multiple joint pain    THERAPY DIAG:  Muscle weakness (generalized)  Other abnormalities of gait and mobility  Rationale for Evaluation and Treatment: Rehabilitation  ONSET DATE: Gradual over a year  SUBJECTIVE:   SUBJECTIVE STATEMENT: My back is really hurting today. 10/10 back. Usually my back is painful in the morning and it gets better after lunch time.  PERTINENT HISTORY: DM type 2, HTN, bilateral partial knee replacements 2016, hearing aids PAIN:  Are you having pain?  Achy pain in multiple joints (back, hip, shoulders)  PRECAUTIONS: Other: Patient has a fear of falling  RED FLAGS: None   WEIGHT BEARING RESTRICTIONS: No  FALLS:  Has patient fallen in last 6 months? No  LIVING ENVIRONMENT: Lives with: lives with their family and lives with their spouse Lives in: House/apartment Stairs:  She has stairs in her house but she does not use them. In the community she is able to use do stairs with a railing if needed. Has following equipment at home:  Single [employer] she uses occasionally in the community  OCCUPATION: Retired; Leisure: water aerobics  PLOF: Independent  PATIENT GOALS: To gain confidence in her balance to get back to water aerobics class and the golf course. She has not golfed  in 3 years.  NEXT MD VISIT: PRN  OBJECTIVE:   DIAGNOSTIC FINDINGS: None   COGNITION: Overall cognitive status: Within functional limits for tasks assessed     MUSCLE LENGTH: Hamstrings: Right 30 deg; Left 55 deg  Lumbar ROM: Flexion: fingers to legs   Passive ROM Right eval Left eval  Hip flexion    Hip extension    Hip abduction    Hip adduction    Hip internal rotation    Hip external rotation 60 30  Knee flexion 112 118  Knee extension    Ankle dorsiflexion    Ankle plantarflexion    Ankle inversion    Ankle eversion     (Blank rows = not tested)  LOWER EXTREMITY MMT:  MMT Right eval Left eval  Hip flexion 4- 4-  Hip extension    Hip abduction 4+ 4+  Hip adduction 4 4  Hip internal rotation    Hip external rotation    Knee flexion 4- 3+  Knee extension 4- 3+  Ankle dorsiflexion    Ankle plantarflexion    Ankle inversion    Ankle eversion     (Blank rows = not tested)    FUNCTIONAL TESTS: SL Balance: More balance and control Rt compared to Lt  5 times sit to stand: 15.17secs; using bilateral armrests Timed up and go (TUG): 12.74sec 6 minute walk test: 876ft Dynamic Gait Index: 14/24= risk of falls  GAIT: Distance walked: 834ft Assistive device utilized: None Level of assistance: Complete Independence Comments: short stride length, occasional scissor placement of Lt LE, looses balance when she turns her head   TODAY'S TREATMENT:                                                                                                                              DATE:  06/11/23: 3 way stability ball stretch x 5 each way LTR x 10 each way Posterior Pelvic Tilt x 10 Alt Hand and Knee Press + TA contraction x 10 each Hurdles (forwards/ sideways) x 4 each no UE support 4 square toe tap x 8 each leg  Step Ups 4 inch step x 10 each leg no UE support Standing hip series on airex in parallel bars (hip flexion, abduction, extension) with 2lb ankle weights  2x10 each leg Airex balance beam (Forward and backwards walking) x 3 Moderate UE support Standing Biceps Curl 3# DB  x 10 Standing Punches 2# x 20 Sit to Stands x 10 no UE support Seated hamstring stretch 2 x 20 sec each leg Seated piriformis stretch 2 x 20 sec each leg Seated QL stretch on peanut    06/08/23: NuStep L3 x 6' PT present to monitor & discuss progress Standing hip series at barre (hip flexion, abduction, extension) with 2lb ankle weights 2x10 each leg Standing Punches 2 lb 2 x 10 Standing shoulder flexion 2 x 10 2 lb Seated LAQ 2# ankle weight x 12 each LE Sit to stand from chair with airex pad under feet x5 Standing Heel raises x 15 4 square step test (clockwise and counterclockwise) x 5 boxes each way Leg Press seat 5; bilateral 40 lbs 2 x 10; unilateral 15 lbs 2 x 10 Seated Biceps Curl 2 x 10 2 lb DB Airex Step Ups & Side Steps x10 Seated hamstring S 2 x 30 sec each Seated 3-way stability ball stretch x 5 each way   06/04/23: Supine LTR x 10 Supine Fig 4 x20" bil Supine PPT x10 Attempted bridges but HS cramps Supine hip flexion isometric 5x5" alt LE  Seated heel/toe raises x20 LAQ 1x10 bil Sit to stand from chair with airex pad under feet x5 Hurdles in parallel bars step to pattern Rt lead and Lt lead x2 rounds, sidestep x2 rounds with bil UE support Standing hip series at barre (hip flexion, abduction, extension) with 2lb ankle weights x10 each leg 4 square step test (clockwise and counterclockwise) x 5 boxes each way Fwd step with contralateral UE reach x10 each side Airex balance beam in parallel bars x 4 passes, then tandem balance with each foot position 2x30" with intermittent UE support on bil rails NuStep L5 x 6' PT present to monitor    PATIENT EDUCATION:  Education details: Access Code: [email or web address] Person educated: Patient Education method: Explanation, Demonstration, and Handouts Education comprehension: verbalized understanding and needs further  education  HOME EXERCISE PROGRAM: Access Code: [email or web address] F URL: [email or web address] Date: 05/26/2023 Prepared by: Claude Manges  Exercises - Heel Raises with Counter Support  - 1 x daily - 7 x weekly - 1 sets - 10 reps - Sit to Stand with Armchair  - 1 x daily - 7 x weekly - 1 sets - 10 reps - Seated Long Arc Quad  - 1 x daily - 7 x weekly - 1 sets - 10 reps - 5 hold - Seated Toe Raise  - 1 x daily - 7 x weekly - 1 sets - 10 reps - Seated Heel Raise  - 1 x daily - 7 x weekly - 1 sets - 10 reps - Seated Knee Flexion with Anchored Resistance  - 1 x daily - 7 x weekly - 1 sets - 10 reps - Seated Hip Abduction with Resistance  - 1 x daily - 7 x weekly - 1 sets - 10 reps  ASSESSMENT:  CLINICAL IMPRESSION: Today's treatment session focused on balance and general strengthening. Patient's back was very painful today so lumbar stretches were incorporated into today's treatment session. At end of session patient verbalized a decrease in back pain. Patient was able to perform multiple balance exercises without using UE support. She caught her foot on hurdle once but was able to self recover balance using UE support. Progressed strengthening exercises to an unstable surface which was more challenging for patient. Patient will benefit from skilled PT to address the below impairments and improve overall function.     OBJECTIVE  IMPAIRMENTS: decreased activity tolerance, decreased balance, decreased endurance, decreased ROM, decreased strength, decreased safety awareness, impaired flexibility, and pain.   ACTIVITY LIMITATIONS: bending, standing, squatting, and stairs  PARTICIPATION LIMITATIONS: shopping and community activity  PERSONAL FACTORS: 1-2 comorbidities: HTN and Type 2 DM  are also affecting patient's functional outcome.   REHAB POTENTIAL: Good  CLINICAL DECISION MAKING: Stable/uncomplicated  EVALUATION COMPLEXITY: Low   GOALS: Goals reviewed with patient? Yes  SHORT  TERM GOALS: Target date: 06/23/2023   Patient will be compliant with initial HEP. Baseline: Goal status: INITIAL  2.  Patient will walk > or = 83ft during 6 minute walk test. Baseline: 820ft Goal status: INITIAL   LONG TERM GOALS: Target date: 07/21/2023    Patient will be compliant with advanced HEP. Baseline:  Goal status: INITIAL  2.  Patient will be able to safely attend aerobics classes at [facility]. Baseline:  Goal status: INITIAL  3.  Patient will achieve > or = to 16/24 on DGI to decrease risk of falls. Baseline: 14/24 Goal status: INITIAL  4.  Patient will walk > or = 846ft during 6 minute walk test. Baseline: 857ft Goal status: INITIAL     PLAN:  PT FREQUENCY: 2x/week  PT DURATION: 8 weeks  PLANNED INTERVENTIONS: Therapeutic exercises, Therapeutic activity, Neuromuscular re-education, Balance training, Gait training, Patient/Family education, Self Care, Joint mobilization, and Dry Needling  PLAN FOR NEXT SESSION: Continue progressing balance on unstable surfaces and narrow BOS walking   Claude Manges, PT 06/11/23 12:02 PM

## 2023-06-15 ENCOUNTER — Encounter: Payer: Self-pay | Admitting: Physical Therapy

## 2023-06-15 ENCOUNTER — Ambulatory Visit: Payer: Medicare Other | Admitting: Physical Therapy

## 2023-06-15 DIAGNOSIS — M6281 Muscle weakness (generalized): Secondary | ICD-10-CM

## 2023-06-15 DIAGNOSIS — R2689 Other abnormalities of gait and mobility: Secondary | ICD-10-CM

## 2023-06-15 NOTE — Therapy (Addendum)
OUTPATIENT PHYSICAL THERAPY LOWER EXTREMITY TREATMENT   Patient Name: Ann Thompson MRN: [id number] DOB:[birth year], 82 y.o., female Today's Date: 06/15/2023  END OF SESSION:  PT End of Session - 06/15/23 1326     Visit Number 6    Date for PT Re-Evaluation 07/21/23    Authorization Type Medicare Part A and B- KX modifier needed after visit 15    Progress Note Due on Visit 10    PT Start Time 1148    PT Stop Time 1229    PT Time Calculation (min) 41 min    Equipment Utilized During Treatment Gait belt    Activity Tolerance Patient tolerated treatment well    Behavior During Therapy WFL for tasks assessed/performed                  Past Medical History:  Diagnosis Date   Arthritis    DM type 2 (diabetes mellitus, type 2) (HCC)    GERD (gastroesophageal reflux disease)    History of kidney stones    Hyperlipemia    Hypertension    Left leg pain    Vision changes    Wears hearing aid in both ears    Past Surgical History:  Procedure Laterality Date   ABDOMINAL HYSTERECTOMY  1982   partial   APPENDECTOMY  1957   BACK SURGERY  07/12/2020   lower at [facility]   CYSTOSCOPY W/ URETERAL STENT PLACEMENT Right 10/06/2020   Procedure: CYSTOSCOPY WITH RETROGRADE PYELOGRAM/URETERAL STENT PLACEMENT;  Surgeon: Sebastian Ache, MD;  Location: [facility] OR;  Service: Urology;  Laterality: Right;   CYSTOSCOPY WITH RETROGRADE PYELOGRAM, URETEROSCOPY AND STENT PLACEMENT Right 10/26/2020   Procedure: CYSTOSCOPY WITH RETROGRADE PYELOGRAM, URETEROSCOPY AND STENT REPLACEMENT;  Surgeon: Sebastian Ache, MD;  Location: [facility];  Service: Urology;  Laterality: Right;  90 MINS   HOLMIUM LASER APPLICATION Right 10/26/2020   Procedure: HOLMIUM LASER APPLICATION;  Surgeon: Sebastian Ache, MD;  Location: [facility];  Service: Urology;  Laterality: Right;   JOINT REPLACEMENT  2016   both knees partial replacement   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   Pain of left hip joint 12/25/2020   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   Ureteral stone 10/06/2020   Ureteral stone with hydronephrosis 10/06/2020   Lumbar radiculopathy 08/23/2020   Hypertension    Disc displacement, 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   Trochanteric bursitis of left hip 03/22/2020   Type 2 diabetes mellitus (HCC) 02/17/2020   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 She is not currently having any back pain. She has been doing her exercises and her legs feel a little sore.  PERTINENT HISTORY: DM type 2, HTN, bilateral partial knee replacements 2016, hearing aids  PAIN: 0/10 06/15/2023 Are you having pain?  Achy pain in multiple joints (back, hip, shoulders)  PRECAUTIONS: Other: Patient has a fear of falling  RED FLAGS: None  WEIGHT BEARING RESTRICTIONS: No  FALLS:  Has patient fallen in last 6 months? No  LIVING ENVIRONMENT: Lives with: lives with their family and lives with their spouse Lives in: House/apartment Stairs:  She has stairs in her house but she does not use them. In the community she is able to use do stairs with a railing if needed. Has following equipment at home:  Single [employer] she uses occasionally in the community  OCCUPATION: Retired; Leisure: water aerobics  PLOF: Independent  PATIENT GOALS: To gain confidence in  her balance to get back to water aerobics class and the golf course. She has not golfed in 3 years.  NEXT MD VISIT: PRN  OBJECTIVE:   DIAGNOSTIC FINDINGS: None   COGNITION: Overall cognitive status: Within functional limits for tasks assessed     MUSCLE LENGTH: Hamstrings: Right 30 deg; Left 55 deg  Lumbar ROM: Flexion: fingers to legs   Passive ROM Right eval Left eval  Hip flexion    Hip extension    Hip abduction    Hip adduction    Hip internal rotation    Hip external rotation 60 30  Knee flexion 112 118  Knee extension    Ankle dorsiflexion    Ankle plantarflexion    Ankle inversion    Ankle eversion     (Blank rows = not tested)  LOWER EXTREMITY MMT:  MMT Right eval Left eval  Hip flexion 4- 4-  Hip extension    Hip abduction 4+ 4+  Hip adduction 4 4  Hip internal rotation    Hip external rotation    Knee flexion 4- 3+  Knee extension 4- 3+  Ankle dorsiflexion    Ankle plantarflexion    Ankle inversion    Ankle eversion     (Blank rows = not tested)    FUNCTIONAL TESTS: SL Balance: More balance and control Rt compared to Lt  5 times sit to stand: 15.17secs; using bilateral armrests Timed up and go (TUG): 12.74sec 6 minute walk test: 824ft Dynamic Gait Index: 14/24= risk of falls  GAIT: Distance walked: 890ft Assistive device utilized: None Level of assistance: Complete Independence Comments: short stride length, occasional scissor placement of Lt LE, looses balance when she turns her head   TODAY'S TREATMENT:                                                                                                                              DATE:  06/15/23: NuStep Level 3 6 mins- PT present to discuss progress Cone Weaving x 4 PT SBA Cone In and Outs x 4 PT SBA Cone Taps Min - Mod A hand held assist and then no UE support PT CGA Cone Pick Up PT SBA Step Ups 4 inch step x 10 each leg no UE support then 6 inch step Min UE support x10 4 square  toe tap x 8 each leg  Updated HEP to include exercises with  a dumbbell Seated hip flexion press + TA contraction x 10 each Sit to Stand on airex x10 no UE support Seated hamstring stretch 2 x 20 sec each leg Seated QL stretch on peanut x10 each side   06/11/23: 3 way stability ball stretch x 5 each way LTR x 10 each way Posterior Pelvic Tilt x 10 Alt Hand and Knee Press + TA contraction x 10 each Hurdles (forwards/ sideways) x 4 each no UE support 4 square toe tap x 8 each leg  Step Ups 4 inch step x 10 each leg no UE support Standing hip series on airex in parallel bars (hip flexion, abduction, extension) with 2lb ankle weights 2x10 each leg Airex balance beam (Forward and backwards walking) x 3 Moderate UE support Standing Biceps Curl 3# DB x 10 Standing Punches 2# x 20 Sit to Stands x 10 no UE support Seated hamstring stretch 2 x 20 sec each leg Seated piriformis stretch 2 x 20 sec each leg Seated QL stretch on peanut    06/08/23: NuStep L3 x 6' PT present to monitor & discuss progress Standing hip series at barre (hip flexion, abduction, extension) with 2lb ankle weights 2x10 each leg Standing Punches 2 lb 2 x 10 Standing shoulder flexion 2 x 10 2 lb Seated LAQ 2# ankle weight x 12 each LE Sit to stand from chair with airex pad under feet x5 Standing Heel raises x 15 4 square step test (clockwise and counterclockwise) x 5 boxes each way Leg Press seat 5; bilateral 40 lbs 2 x 10; unilateral 15 lbs 2 x 10 Seated Biceps Curl 2 x 10 2 lb DB Airex Step Ups & Side Steps x10 Seated hamstring S 2 x 30 sec each Seated 3-way stability ball stretch x 5 each way    PATIENT EDUCATION:  Education details: Access Code: [email or web address] F Person educated: Patient Education method: Explanation, Demonstration, and Handouts Education comprehension: verbalized understanding and needs further education  HOME EXERCISE PROGRAM: Access Code: [email or web address] F URL: [email or web address] Date:  06/15/2023 Prepared by: Claude Manges  Exercises - Heel Raises with Counter Support  - 1 x daily - 7 x weekly - 1 sets - 10 reps - Sit to Stand with Armchair  - 1 x daily - 7 x weekly - 1 sets - 10 reps - Seated Long Arc Quad  - 1 x daily - 7 x weekly - 1 sets - 10 reps - 5 hold - Seated Toe Raise  - 1 x daily - 7 x weekly - 1 sets - 10 reps - Seated Heel Raise  - 1 x daily - 7 x weekly - 1 sets - 10 reps - Seated Knee Flexion with Anchored Resistance  - 1 x daily - 7 x weekly - 1 sets - 10 reps - Seated Hip Abduction with Resistance  - 1 x daily - 7 x weekly - 1 sets - 10 reps - Seated Hip Flexion March with Ankle Weights  - 1 x daily - 7 x weekly - 1 sets - 10 reps - Seated Single Arm Chest Press with Dumbbell  - 1 x daily - 7 x weekly - 1 sets - 10 reps - Seated Bicep Curls Supinated with Dumbbells  - 1 x daily - 7 x weekly - 1 sets - 8 reps  ASSESSMENT:  CLINICAL IMPRESSION: Today's treatment session focused on balance and LE strengthening. Noted good dynamic balance during cone weaving exercise patient was able to perform exercise with improved smoothness. Progressed patient's  step height and she caught foot on step two times but was able to self recover using UE support. Educated patient on exercises to perform at home using her 3# ankle weights and updated HEP. Patient will benefit from skilled PT to address the below impairments and improve overall function.     OBJECTIVE IMPAIRMENTS: decreased activity tolerance, decreased balance, decreased endurance, decreased ROM, decreased strength, decreased safety awareness, impaired flexibility, and pain.   ACTIVITY LIMITATIONS: bending, standing, squatting, and stairs  PARTICIPATION LIMITATIONS: shopping and community activity  PERSONAL FACTORS: 1-2 comorbidities: HTN and Type 2 DM  are also affecting patient's functional outcome.   REHAB POTENTIAL: Good  CLINICAL DECISION MAKING: Stable/uncomplicated  EVALUATION COMPLEXITY:  Low   GOALS: Goals reviewed with patient? Yes  SHORT TERM GOALS: Target date: 06/23/2023   Patient will be compliant with initial HEP. Baseline: Goal status: INITIAL  2.  Patient will walk > or = 886ft during 6 minute walk test. Baseline: 828ft Goal status: INITIAL   LONG TERM GOALS: Target date: 07/21/2023    Patient will be compliant with advanced HEP. Baseline:  Goal status: INITIAL  2.  Patient will be able to safely attend aerobics classes at [facility]. Baseline:  Goal status: INITIAL  3.  Patient will achieve > or = to 16/24 on DGI to decrease risk of falls. Baseline: 14/24 Goal status: INITIAL  4.  Patient will walk > or = 826ft during 6 minute walk test. Baseline: 848ft Goal status: INITIAL     PLAN:  PT FREQUENCY: 2x/week  PT DURATION: 8 weeks  PLANNED INTERVENTIONS: Therapeutic exercises, Therapeutic activity, Neuromuscular re-education, Balance training, Gait training, Patient/Family education, Self Care, Joint mobilization, and Dry Needling  PLAN FOR NEXT SESSION: Continue balance on unstable surfaces & LE strengthening   Claude Manges, PT 06/15/23 1:28 PM

## 2023-06-16 ENCOUNTER — Other Ambulatory Visit: Payer: Self-pay | Admitting: Adult Health

## 2023-06-16 DIAGNOSIS — L299 Pruritus, unspecified: Secondary | ICD-10-CM

## 2023-06-16 NOTE — Telephone Encounter (Signed)
  The original prescription was discontinued on 12/02/2022 by [name], NP. Renewing this prescription may not be appropriate.

## 2023-06-17 ENCOUNTER — Other Ambulatory Visit: Payer: Self-pay | Admitting: Adult Health

## 2023-06-17 DIAGNOSIS — G2581 Restless legs syndrome: Secondary | ICD-10-CM

## 2023-06-19 ENCOUNTER — Encounter: Payer: Self-pay | Admitting: Physical Therapy

## 2023-06-19 ENCOUNTER — Ambulatory Visit: Payer: Medicare Other | Admitting: Physical Therapy

## 2023-06-19 DIAGNOSIS — M6281 Muscle weakness (generalized): Secondary | ICD-10-CM

## 2023-06-19 DIAGNOSIS — R2689 Other abnormalities of gait and mobility: Secondary | ICD-10-CM

## 2023-06-19 NOTE — Therapy (Addendum)
OUTPATIENT PHYSICAL THERAPY LOWER EXTREMITY TREATMENT   Patient Name: Ann Thompson MRN: [id number] DOB:November 20, 1940, 82 y.o., female Today's Date: 06/19/2023  END OF SESSION:  PT End of Session - 06/19/23 1147     Visit Number 7    Date for PT Re-Evaluation 07/21/23    Authorization Type Medicare Part A and B- KX modifier needed after visit 15    Progress Note Due on Visit 10    PT Start Time 1103    PT Stop Time 1143    PT Time Calculation (min) 40 min    Activity Tolerance Patient tolerated treatment well    Behavior During Therapy WFL for tasks assessed/performed                   Past Medical History:  Diagnosis Date   Arthritis    DM type 2 (diabetes mellitus, type 2) (HCC)    GERD (gastroesophageal reflux disease)    History of kidney stones    Hyperlipemia    Hypertension    Left leg pain    Vision changes    Wears hearing aid in both ears    Past Surgical History:  Procedure Laterality Date   ABDOMINAL HYSTERECTOMY  1982   partial   APPENDECTOMY  1957   BACK SURGERY  07/12/2020   lower at [facility]   CYSTOSCOPY W/ URETERAL STENT PLACEMENT Right 10/06/2020   Procedure: CYSTOSCOPY WITH RETROGRADE PYELOGRAM/URETERAL STENT PLACEMENT;  Surgeon: Sebastian Ache, MD;  Location: [facility] OR;  Service: Urology;  Laterality: Right;   CYSTOSCOPY WITH RETROGRADE PYELOGRAM, URETEROSCOPY AND STENT PLACEMENT Right 10/26/2020   Procedure: CYSTOSCOPY WITH RETROGRADE PYELOGRAM, URETEROSCOPY AND STENT REPLACEMENT;  Surgeon: Sebastian Ache, MD;  Location: [facility];  Service: Urology;  Laterality: Right;  90 MINS   HOLMIUM LASER APPLICATION Right 10/26/2020   Procedure: HOLMIUM LASER APPLICATION;  Surgeon: Sebastian Ache, MD;  Location: [facility];  Service: Urology;  Laterality: Right;   JOINT REPLACEMENT  2016   both knees partial replacement   Patient Active Problem List   Diagnosis Date Noted   Mass of lower limb 05/27/2021    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   Pain of left hip joint 12/25/2020   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   Ureteral stone 10/06/2020   Ureteral stone with hydronephrosis 10/06/2020   Lumbar radiculopathy 08/23/2020   Hypertension    Disc displacement, 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   Trochanteric bursitis of left hip 03/22/2020   Type 2 diabetes mellitus (HCC) 02/17/2020   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 She is not currently having any pain. She feels her arm exercises are working.  PERTINENT HISTORY: DM type 2, HTN, bilateral partial knee replacements 2016, hearing aids  PAIN: 0/10 06/15/2023 Are you having pain?  Achy pain in multiple joints (back, hip, shoulders)  PRECAUTIONS: Other: Patient has a fear of falling  RED FLAGS: None   WEIGHT BEARING RESTRICTIONS: No  FALLS:  Has patient fallen in last 6  months? No  LIVING ENVIRONMENT: Lives with: lives with their family and lives with their spouse Lives in: House/apartment Stairs:  She has stairs in her house but she does not use them. In the community she is able to use do stairs with a railing if needed. Has following equipment at home:  Single [employer] she uses occasionally in the community  OCCUPATION: Retired; Leisure: water aerobics  PLOF: Independent  PATIENT GOALS: To gain confidence in her balance to get back to water aerobics class and the golf course. She has  not golfed in 3 years.  NEXT MD VISIT: PRN  OBJECTIVE:   DIAGNOSTIC FINDINGS: None   COGNITION: Overall cognitive status: Within functional limits for tasks assessed     MUSCLE LENGTH: Hamstrings: Right 30 deg; Left 55 deg  Lumbar ROM: Flexion: fingers to legs   Passive ROM Right eval Left eval  Hip flexion    Hip extension    Hip abduction    Hip adduction    Hip internal rotation    Hip external rotation 60 30  Knee flexion 112 118  Knee extension    Ankle dorsiflexion    Ankle plantarflexion    Ankle inversion    Ankle eversion     (Blank rows = not tested)  LOWER EXTREMITY MMT:  MMT Right eval Left eval  Hip flexion 4- 4-  Hip extension    Hip abduction 4+ 4+  Hip adduction 4 4  Hip internal rotation    Hip external rotation    Knee flexion 4- 3+  Knee extension 4- 3+  Ankle dorsiflexion    Ankle plantarflexion    Ankle inversion    Ankle eversion     (Blank rows = not tested)    FUNCTIONAL TESTS: SL Balance: More balance and control Rt compared to Lt  5 times sit to stand: 15.17secs; using bilateral armrests Timed up and go (TUG): 12.74sec 6 minute walk test: 833ft Dynamic Gait Index: 14/24= risk of falls  GAIT: Distance walked: 870ft Assistive device utilized: None Level of assistance: Complete Independence Comments: short stride length, occasional scissor placement of Lt LE, looses balance when she turns her head   TODAY'S TREATMENT:                                                                                                                              DATE:  06/19/23: NuStep Level 3 6 mins- PT present to discuss progress Standing hip series on airex ( hip flexion, hip extension, hip abduction) 2 lb ankle weights 2 x 10  Obstacle Course (hurdles, cobblestone x 2, airex) x 5 Hurdles (Forwards/ Sideways) x 4  STS x 10 Cone retrieval (varying heights around gym) x 2  4 square step x 4 each direction Unilateral Farmer's Carries  3lb DB x 4    06/15/23: NuStep Level 3 6 mins- PT present to discuss progress Cone Weaving x 4 PT SBA Cone In  and Outs x 4 PT SBA Cone Taps Min - Mod A hand held assist and then no UE support PT CGA Cone Pick Up PT SBA Step Ups 4 inch step x 10 each leg no UE support then 6 inch step Min UE support x10 4 square toe tap x 8 each leg  Updated HEP to include exercises with a dumbbell Seated hip flexion press + TA contraction x 10 each Sit to Stand on airex x10 no UE support Seated hamstring stretch 2 x 20 sec each leg Seated QL stretch on peanut x10 each side   06/11/23: 3 way stability ball stretch x 5 each way LTR x 10 each way Posterior Pelvic Tilt x 10 Alt Hand and Knee Press + TA contraction x 10 each Hurdles (forwards/ sideways) x 4 each no UE support 4 square toe tap x 8 each leg  Step Ups 4 inch step x 10 each leg no UE support Standing hip series on airex in parallel bars (hip flexion, abduction, extension) with 2lb ankle weights 2x10 each leg Airex balance beam (Forward and backwards walking) x 3 Moderate UE support Standing Biceps Curl 3# DB x 10 Standing Punches 2# x 20 Sit to Stands x 10 no UE support Seated hamstring stretch 2 x 20 sec each leg Seated piriformis stretch 2 x 20 sec each leg Seated QL stretch on peanut    PATIENT EDUCATION:  Education details: Access Code: [email or web address] F Person educated: Patient Education method: Explanation, Demonstration, and Handouts Education comprehension: verbalized understanding and needs further education  HOME EXERCISE PROGRAM: Access Code: [email or web address] F URL: [email or web address] Date: 06/15/2023 Prepared by: Claude Manges  Exercises - Heel Raises with Counter Support  - 1 x daily - 7 x weekly - 1 sets - 10 reps - Sit to Stand with Armchair  - 1 x daily - 7 x weekly - 1 sets - 10 reps - Seated Long Arc Quad  - 1 x daily - 7 x weekly - 1 sets - 10 reps - 5 hold - Seated Toe Raise  - 1 x daily - 7 x weekly - 1 sets  - 10 reps - Seated Heel Raise  - 1 x daily - 7 x weekly - 1 sets - 10 reps - Seated Knee Flexion with Anchored Resistance  - 1 x daily - 7 x weekly - 1 sets - 10 reps - Seated Hip Abduction with Resistance  - 1 x daily - 7 x weekly - 1 sets - 10 reps - Seated Hip Flexion March with Ankle Weights  - 1 x daily - 7 x weekly - 1 sets - 10 reps - Seated Single Arm Chest Press with Dumbbell  - 1 x daily - 7 x weekly - 1 sets - 10 reps - Seated Bicep Curls Supinated with Dumbbells  - 1 x daily - 7 x weekly - 1 sets - 8 reps  ASSESSMENT:  CLINICAL IMPRESSION: Today's treatment session focused on dynamic balance and LE strengthening. Incorporated obstacle course with varying surfaces patient required minimal bilateral UE support while performing. Cone retrieval exercises focused on dynamic balance and visual tracking of objects. Noted safe retrieval of objects that were on lower surfaces. Patient required moderated verbal cues for upright posture while performing standing exercises. With hurdle exercise patient needed to use minimal - moderate UE support on parallel bars. Patient caught foot on hurdle once but was able to self recover balance. Patient will benefit from skilled PT to address the below impairments and  improve overall function.      OBJECTIVE IMPAIRMENTS: decreased activity tolerance, decreased balance, decreased endurance, decreased ROM, decreased strength, decreased safety awareness, impaired flexibility, and pain.   ACTIVITY LIMITATIONS: bending, standing, squatting, and stairs  PARTICIPATION LIMITATIONS: shopping and community activity  PERSONAL FACTORS: 1-2 comorbidities: HTN and Type 2 DM  are also affecting patient's functional outcome.   REHAB POTENTIAL: Good  CLINICAL DECISION MAKING: Stable/uncomplicated  EVALUATION COMPLEXITY: Low   GOALS: Goals reviewed with patient? Yes  SHORT TERM GOALS: Target date: 06/23/2023   Patient will be compliant with initial  HEP. Baseline: Goal status: INITIAL  2.  Patient will walk > or = 877ft during 6 minute walk test. Baseline: 879ft Goal status: INITIAL   LONG TERM GOALS: Target date: 07/21/2023    Patient will be compliant with advanced HEP. Baseline:  Goal status: INITIAL  2.  Patient will be able to safely attend aerobics classes at [facility]. Baseline:  Goal status: INITIAL  3.  Patient will achieve > or = to 16/24 on DGI to decrease risk of falls. Baseline: 14/24 Goal status: INITIAL  4.  Patient will walk > or = 826ft during 6 minute walk test. Baseline: 838ft Goal status: INITIAL     PLAN:  PT FREQUENCY: 2x/week  PT DURATION: 8 weeks  PLANNED INTERVENTIONS: Therapeutic exercises, Therapeutic activity, Neuromuscular re-education, Balance training, Gait training, Patient/Family education, Self Care, Joint mobilization, and Dry Needling  PLAN FOR NEXT SESSION: Continue balance on unstable surfaces & LE strengthening; incorporate shoulder mobility    Claude Manges, PT 06/19/23 11:48 AM

## 2023-06-23 ENCOUNTER — Ambulatory Visit: Payer: Medicare Other | Admitting: Physical Therapy

## 2023-06-23 ENCOUNTER — Encounter: Payer: Self-pay | Admitting: Physical Therapy

## 2023-06-23 DIAGNOSIS — M6281 Muscle weakness (generalized): Secondary | ICD-10-CM

## 2023-06-23 DIAGNOSIS — R2689 Other abnormalities of gait and mobility: Secondary | ICD-10-CM

## 2023-06-23 NOTE — Therapy (Signed)
OUTPATIENT PHYSICAL THERAPY LOWER EXTREMITY TREATMENT   Patient Name: Ann Thompson MRN: [id number] DOB:05/24/1941, 82 y.o., female Today's Date: 06/23/2023  END OF SESSION:  PT End of Session - 06/23/23 1236     Visit Number 8    Date for PT Re-Evaluation 07/21/23    Authorization Type Medicare Part A and B- KX modifier needed after visit 15    Progress Note Due on Visit 10    PT Start Time 1150    PT Stop Time 1230    PT Time Calculation (min) 40 min    Activity Tolerance Patient tolerated treatment well    Behavior During Therapy WFL for tasks assessed/performed                    Past Medical History:  Diagnosis Date   Arthritis    DM type 2 (diabetes mellitus, type 2) (HCC)    GERD (gastroesophageal reflux disease)    History of kidney stones    Hyperlipemia    Hypertension    Left leg pain    Vision changes    Wears hearing aid in both ears    Past Surgical History:  Procedure Laterality Date   ABDOMINAL HYSTERECTOMY  1982   partial   APPENDECTOMY  1957   BACK SURGERY  07/12/2020   lower at [facility]   CYSTOSCOPY W/ URETERAL STENT PLACEMENT Right 10/06/2020   Procedure: CYSTOSCOPY WITH RETROGRADE PYELOGRAM/URETERAL STENT PLACEMENT;  Surgeon: Sebastian Ache, MD;  Location: [facility] OR;  Service: Urology;  Laterality: Right;   CYSTOSCOPY WITH RETROGRADE PYELOGRAM, URETEROSCOPY AND STENT PLACEMENT Right 10/26/2020   Procedure: CYSTOSCOPY WITH RETROGRADE PYELOGRAM, URETEROSCOPY AND STENT REPLACEMENT;  Surgeon: Sebastian Ache, MD;  Location: [facility];  Service: Urology;  Laterality: Right;  90 MINS   HOLMIUM LASER APPLICATION Right 10/26/2020   Procedure: HOLMIUM LASER APPLICATION;  Surgeon: Sebastian Ache, MD;  Location: [facility];  Service: Urology;  Laterality: Right;   JOINT REPLACEMENT  2016   both knees partial replacement   Patient Active Problem List   Diagnosis Date Noted   Mass of lower limb 05/27/2021    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   Pain of left hip joint 12/25/2020   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   Ureteral stone 10/06/2020   Ureteral stone with hydronephrosis 10/06/2020   Lumbar radiculopathy 08/23/2020   Hypertension    Disc displacement, 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   Trochanteric bursitis of left hip 03/22/2020   Type 2 diabetes mellitus (HCC) 02/17/2020   Status post bilateral knee replacements 06/07/2015   S/P bilateral unicompartmental knee replacement 03/01/2015    PCP: [name], NPRef Provider (PCP)   REFERRING PROVIDER: [name], Frazier Richards Provider (PCP)   REFERRING DIAG:  R26.81 (ICD-10-CM) - Gait instability  M25.50 (ICD-10-CM) - Multiple joint pain    THERAPY DIAG:  Muscle weakness (generalized)  Other abnormalities of gait and mobility  Rationale for Evaluation and Treatment: Rehabilitation  ONSET DATE: Gradual over a year  SUBJECTIVE:   SUBJECTIVE STATEMENT: Patient reports she is doing okay today. She felt sore after last treatment session.  PERTINENT HISTORY: DM type 2, HTN, bilateral partial knee replacements 2016, hearing aids  PAIN: 0/10 06/15/2023 Are you having pain?  Achy pain in multiple joints (back, hip, shoulders)  PRECAUTIONS: Other: Patient has a fear of falling  RED FLAGS: None   WEIGHT BEARING RESTRICTIONS: No  FALLS:  Has patient fallen in last 6 months? No  LIVING ENVIRONMENT: Lives  with: lives with their family and lives with their spouse Lives in: House/apartment Stairs:  She has stairs in her house but she does not use them. In the community she is able to use do stairs with a railing if needed. Has following equipment at home:  Single [employer] she uses occasionally in the community  OCCUPATION: Retired; Leisure: water aerobics  PLOF: Independent  PATIENT GOALS: To gain confidence in her balance to get back to water aerobics class and the golf course. She has not golfed in 3 years.  NEXT  MD VISIT: PRN  OBJECTIVE:   DIAGNOSTIC FINDINGS: None   COGNITION: Overall cognitive status: Within functional limits for tasks assessed     MUSCLE LENGTH: Hamstrings: Right 30 deg; Left 55 deg  Lumbar ROM: Flexion: fingers to legs   Passive ROM Right eval Left eval  Hip flexion    Hip extension    Hip abduction    Hip adduction    Hip internal rotation    Hip external rotation 60 30  Knee flexion 112 118  Knee extension    Ankle dorsiflexion    Ankle plantarflexion    Ankle inversion    Ankle eversion     (Blank rows = not tested)  LOWER EXTREMITY MMT:  MMT Right eval Left eval  Hip flexion 4- 4-  Hip extension    Hip abduction 4+ 4+  Hip adduction 4 4  Hip internal rotation    Hip external rotation    Knee flexion 4- 3+  Knee extension 4- 3+  Ankle dorsiflexion    Ankle plantarflexion    Ankle inversion    Ankle eversion     (Blank rows = not tested)    FUNCTIONAL TESTS: SL Balance: More balance and control Rt compared to Lt  5 times sit to stand: 15.17secs; using bilateral armrests Timed up and go (TUG): 12.74sec 6 minute walk test: 874ft Dynamic Gait Index: 14/24= risk of falls  GAIT: Distance walked: 852ft Assistive device utilized: None Level of assistance: Complete Independence Comments: short stride length, occasional scissor placement of Lt LE, looses balance when she turns her head   TODAY'S TREATMENT:                                                                                                                              DATE:  06/23/23: NuStep Level 3 6 mins- PT present to discuss progress Step Ups 4 inch step x 10 each leg Side Step Ups 4 inch step x 10 each leg Standing hip series on airex ( hip extension, hip abduction) 2 lb ankle weights 2 x 10  Airex Side Steps 2# ankle weight x 10 Standing Y balance x 10 each leg Unilateral Farmer's Carries 3lb DB x 4 Obstacle Course (hurdles x 2, cobblestone x 2, airex) x 4 Hurdles  (Forwards) x 4  STS x 10 Seated hamstring stretch 3 x 15 sec Seated piriformis stretch 3  x 20 sec Standing "L" stretch at parallel bar 3 x 10 sec    06/19/23: NuStep Level 3 6 mins- PT present to discuss progress Standing hip series on airex ( hip flexion, hip extension, hip abduction) 2 lb ankle weights 2 x 10  Obstacle Course (hurdles, cobblestone x 2, airex) x 5 Hurdles (Forwards/ Sideways) x 4  STS x 10 Cone retrieval (varying heights around gym) x 2  4 square step x 4 each direction Unilateral Farmer's Carries 3lb DB x 4    06/15/23: NuStep Level 3 6 mins- PT present to discuss progress Cone Weaving x 4 PT SBA Cone In and Outs x 4 PT SBA Cone Taps Min - Mod A hand held assist and then no UE support PT CGA Cone Pick Up PT SBA Step Ups 4 inch step x 10 each leg no UE support then 6 inch step Min UE support x10 4 square toe tap x 8 each leg  Updated HEP to include exercises with a dumbbell Seated hip flexion press + TA contraction x 10 each Sit to Stand on airex x10 no UE support Seated hamstring stretch 2 x 20 sec each leg Seated QL stretch on peanut x10 each side    PATIENT EDUCATION:  Education details: Access Code: [email or web address] F Person educated: Patient Education method: Explanation, Demonstration, and Handouts Education comprehension: verbalized understanding and needs further education  HOME EXERCISE PROGRAM: Access Code: [email or web address] F URL: [email or web address] Date: 06/15/2023 Prepared by: Claude Manges  Exercises - Heel Raises with Counter Support  - 1 x daily - 7 x weekly - 1 sets - 10 reps - Sit to Stand with Armchair  - 1 x daily - 7 x weekly - 1 sets - 10 reps - Seated Long Arc Quad  - 1 x daily - 7 x weekly - 1 sets - 10 reps - 5 hold - Seated Toe Raise  - 1 x daily - 7 x weekly - 1 sets - 10 reps - Seated Heel Raise  - 1 x daily - 7 x weekly - 1 sets - 10 reps - Seated Knee Flexion with Anchored Resistance  - 1 x daily - 7 x weekly - 1 sets - 10  reps - Seated Hip Abduction with Resistance  - 1 x daily - 7 x weekly - 1 sets - 10 reps - Seated Hip Flexion March with Ankle Weights  - 1 x daily - 7 x weekly - 1 sets - 10 reps - Seated Single Arm Chest Press with Dumbbell  - 1 x daily - 7 x weekly - 1 sets - 10 reps - Seated Bicep Curls Supinated with Dumbbells  - 1 x daily - 7 x weekly - 1 sets - 8 reps  ASSESSMENT:  CLINICAL IMPRESSION: Today's treatment session focused on LE strengthening and dynamic balance. Patient verbalized that she feels physical therapy is helping and her back has not been painful. She feels the most unbalanced in the morning when she wakes up. Plan to incorporated balance with eyes closed to challenge patient's vestibular and somatosensory system. Patient required moderate verbal cues for upright posture and form correction. Patient will benefit from skilled PT to address the below impairments and improve overall function.      OBJECTIVE IMPAIRMENTS: decreased activity tolerance, decreased balance, decreased endurance, decreased ROM, decreased strength, decreased safety awareness, impaired flexibility, and pain.   ACTIVITY LIMITATIONS: bending, standing, squatting, and stairs  PARTICIPATION LIMITATIONS: shopping and community activity  PERSONAL FACTORS: 1-2 comorbidities: HTN  and Type 2 DM  are also affecting patient's functional outcome.   REHAB POTENTIAL: Good  CLINICAL DECISION MAKING: Stable/uncomplicated  EVALUATION COMPLEXITY: Low   GOALS: Goals reviewed with patient? Yes  SHORT TERM GOALS: Target date: 06/23/2023   Patient will be compliant with initial HEP. Baseline: Goal status: INITIAL  2.  Patient will walk > or = 828ft during 6 minute walk test. Baseline: 866ft Goal status: INITIAL   LONG TERM GOALS: Target date: 07/21/2023    Patient will be compliant with advanced HEP. Baseline:  Goal status: INITIAL  2.  Patient will be able to safely attend aerobics classes at [facility]. Baseline:  Goal status: INITIAL  3.  Patient will achieve > or = to 16/24 on DGI to decrease risk of falls. Baseline: 14/24 Goal status: INITIAL  4.  Patient will walk > or = 827ft during 6 minute walk test. Baseline: 88ft Goal status: INITIAL     PLAN:  PT FREQUENCY: 2x/week  PT DURATION: 8 weeks  PLANNED INTERVENTIONS: Therapeutic exercises, Therapeutic activity, Neuromuscular re-education, Balance training, Gait training, Patient/Family education, Self Care, Joint mobilization, and Dry Needling  PLAN FOR NEXT SESSION: balance with eyes closed; LE strengthening   Claude Manges, PT 06/23/23 12:37 PM

## 2023-06-25 ENCOUNTER — Ambulatory Visit: Payer: Medicare Other | Admitting: Physical Therapy

## 2023-06-25 ENCOUNTER — Encounter: Payer: Self-pay | Admitting: Physical Therapy

## 2023-06-25 ENCOUNTER — Encounter: Payer: Self-pay | Admitting: Adult Health

## 2023-06-25 DIAGNOSIS — M6281 Muscle weakness (generalized): Secondary | ICD-10-CM | POA: Diagnosis not present

## 2023-06-25 DIAGNOSIS — R2689 Other abnormalities of gait and mobility: Secondary | ICD-10-CM

## 2023-06-25 MED ORDER — SERTRALINE HCL 50 MG PO TABS: 50.0000 mg | ORAL_TABLET | Freq: Every day | ORAL | 1 refills | Status: DC

## 2023-06-25 NOTE — Therapy (Signed)
OUTPATIENT PHYSICAL THERAPY LOWER EXTREMITY TREATMENT   Patient Name: Ann Thompson MRN: [id number] DOB:1941-05-18, 82 y.o., female Today's Date: 06/25/2023  END OF SESSION:  PT End of Session - 06/25/23 1328     Visit Number 9    Date for PT Re-Evaluation 07/21/23    Authorization Type Medicare Part A and B- KX modifier needed after visit 15    Progress Note Due on Visit 10    PT Start Time 1232    PT Stop Time 1312    PT Time Calculation (min) 40 min    Activity Tolerance Patient tolerated treatment well    Behavior During Therapy WFL for tasks assessed/performed                     Past Medical History:  Diagnosis Date   Arthritis    DM type 2 (diabetes mellitus, type 2) (HCC)    GERD (gastroesophageal reflux disease)    History of kidney stones    Hyperlipemia    Hypertension    Left leg pain    Vision changes    Wears hearing aid in both ears    Past Surgical History:  Procedure Laterality Date   ABDOMINAL HYSTERECTOMY  1982   partial   APPENDECTOMY  1957   BACK SURGERY  07/12/2020   lower at [facility]   CYSTOSCOPY W/ URETERAL STENT PLACEMENT Right 10/06/2020   Procedure: CYSTOSCOPY WITH RETROGRADE PYELOGRAM/URETERAL STENT PLACEMENT;  Surgeon: Sebastian Ache, MD;  Location: [facility];  Service: Urology;  Laterality: Right;   CYSTOSCOPY WITH RETROGRADE PYELOGRAM, URETEROSCOPY AND STENT PLACEMENT Right 10/26/2020   Procedure: CYSTOSCOPY WITH RETROGRADE PYELOGRAM, URETEROSCOPY AND STENT REPLACEMENT;  Surgeon: Sebastian Ache, MD;  Location: [facility];  Service: Urology;  Laterality: Right;  90 MINS   HOLMIUM LASER APPLICATION Right 10/26/2020   Procedure: HOLMIUM LASER APPLICATION;  Surgeon: Sebastian Ache, MD;  Location: [facility];  Service: Urology;  Laterality: Right;   JOINT REPLACEMENT  2016   both knees partial replacement   Patient Active Problem List   Diagnosis Date Noted   Mass of lower limb 05/27/2021    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   Pain of left hip joint 12/25/2020   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   Ureteral stone 10/06/2020   Ureteral stone with hydronephrosis 10/06/2020   Lumbar radiculopathy 08/23/2020   Hypertension    Disc displacement, 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   Trochanteric bursitis of left hip 03/22/2020   Type 2 diabetes mellitus (HCC) 02/17/2020   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 She is not currently having any pain. She felt good after last treatment session.  PERTINENT HISTORY: DM type 2, HTN, bilateral partial knee replacements 2016, hearing aids  PAIN: 0/10 06/25/2023 Are you having pain?  Achy pain in multiple joints (back, hip, shoulders)  PRECAUTIONS: Other: Patient has a fear of falling  RED FLAGS: None   WEIGHT BEARING RESTRICTIONS: No  FALLS:  Has patient fallen in  last 6 months? No  LIVING ENVIRONMENT: Lives with: lives with their family and lives with their spouse Lives in: House/apartment Stairs:  She has stairs in her house but she does not use them. In the community she is able to use do stairs with a railing if needed. Has following equipment at home:  Single [employer] she uses occasionally in the community  OCCUPATION: Retired; Leisure: water aerobics  PLOF: Independent  PATIENT GOALS: To gain confidence in her balance to get back to water aerobics class and the golf course.  She has not golfed in 3 years.  NEXT MD VISIT: PRN  OBJECTIVE:   DIAGNOSTIC FINDINGS: None   COGNITION: Overall cognitive status: Within functional limits for tasks assessed     MUSCLE LENGTH: Hamstrings: Right 30 deg; Left 55 deg  Lumbar ROM: Flexion: fingers to legs   Passive ROM Right eval Left eval  Hip flexion    Hip extension    Hip abduction    Hip adduction    Hip internal rotation    Hip external rotation 60 30  Knee flexion 112 118  Knee extension    Ankle dorsiflexion    Ankle plantarflexion    Ankle inversion    Ankle eversion     (Blank rows = not tested)  LOWER EXTREMITY MMT:  MMT Right eval Left eval  Hip flexion 4- 4-  Hip extension    Hip abduction 4+ 4+  Hip adduction 4 4  Hip internal rotation    Hip external rotation    Knee flexion 4- 3+  Knee extension 4- 3+  Ankle dorsiflexion    Ankle plantarflexion    Ankle inversion    Ankle eversion     (Blank rows = not tested)    FUNCTIONAL TESTS: SL Balance: More balance and control Rt compared to Lt  5 times sit to stand: 15.17secs; using bilateral armrests Timed up and go (TUG): 12.74sec 6 minute walk test: 86ft Dynamic Gait Index: 14/24= risk of falls  06/25/2023: 6 MWT: 960 ft GAIT: Distance walked: 826ft Assistive device utilized: None Level of assistance: Complete Independence Comments: short stride length, occasional scissor placement of Lt LE, looses balance when she turns her head   TODAY'S TREATMENT:                                                                                                                              DATE:  06/25/23: NuStep Level 3 6 mins- PT present to discuss progress 6 MWT: 960 ft Seated Bicep Curls 1# 2 x 10 Airex Step Ups & Side Steps x 10 each Standing hip series on airex ( hip extension, hip abduction) 2 lb ankle weights  x 10  Hurdles (Forwards & Side ways) x 4 Unilateral UE support on barre Resisted Walking (forwards, backwards,  sideways) 5 # x 4 each   06/23/23: NuStep Level 3 6 mins- PT present to discuss progress Step Ups 4  inch step x 10 each leg Side Step Ups 4 inch step x 10 each leg Standing hip series on airex ( hip extension, hip abduction) 2 lb ankle weights 2 x 10  Airex Side Steps 2# ankle weight x 10 Standing Y balance x 10 each leg Unilateral Farmer's Carries 3lb DB x 4 Obstacle Course (hurdles x 2, cobblestone x 2, airex) x 4 Hurdles (Forwards) x 4  STS x 10 Seated hamstring stretch 3 x 15 sec Seated piriformis stretch 3 x 20 sec Standing "L" stretch at parallel bar 3 x 10 sec    06/19/23: NuStep Level 3 6 mins- PT present to discuss progress Standing hip series on airex ( hip flexion, hip extension, hip abduction) 2 lb ankle weights 2 x 10  Obstacle Course (hurdles, cobblestone x 2, airex) x 5 Hurdles (Forwards/ Sideways) x 4  STS x 10 Cone retrieval (varying heights around gym) x 2  4 square step x 4 each direction Unilateral Farmer's Carries 3lb DB x 4    PATIENT EDUCATION:  Education details: Access Code: [email or web address] F Person educated: Patient Education method: Explanation, Demonstration, and Handouts Education comprehension: verbalized understanding and needs further education  HOME EXERCISE PROGRAM: Access Code: [email or web address] F URL: [email or web address] Date: 06/15/2023 Prepared by: Claude Manges  Exercises - Heel Raises with Counter Support  - 1 x daily - 7 x weekly - 1 sets - 10 reps - Sit to Stand with Armchair  - 1 x daily - 7 x weekly - 1 sets - 10 reps - Seated Long Arc Quad  - 1 x daily - 7 x weekly - 1 sets - 10 reps - 5 hold - Seated Toe Raise  - 1 x daily - 7 x weekly - 1 sets - 10 reps - Seated Heel Raise  - 1 x daily - 7 x weekly - 1 sets - 10 reps - Seated Knee Flexion with Anchored Resistance  - 1 x daily - 7 x weekly - 1 sets - 10 reps - Seated Hip Abduction with Resistance  - 1 x daily - 7 x weekly - 1 sets - 10 reps - Seated Hip Flexion March with  Ankle Weights  - 1 x daily - 7 x weekly - 1 sets - 10 reps - Seated Single Arm Chest Press with Dumbbell  - 1 x daily - 7 x weekly - 1 sets - 10 reps - Seated Bicep Curls Supinated with Dumbbells  - 1 x daily - 7 x weekly - 1 sets - 8 reps  ASSESSMENT:  CLINICAL IMPRESSION: Reassessed patient's 6 minute walk test today and she walked 100 more feet than she did at eval. Four minutes into the test patient verbalized her legs felt tired but she was able to keep walking. At this time patient has met all of her short term goals. Incorporated resisted walking and that was a good challenge for patient. Sideways stepping was the most challenging for patient. Patient will benefit from skilled PT to address the below impairments and improve overall function.       OBJECTIVE IMPAIRMENTS: decreased activity tolerance, decreased balance, decreased endurance, decreased ROM, decreased strength, decreased safety awareness, impaired flexibility, and pain.   ACTIVITY LIMITATIONS: bending, standing, squatting, and stairs  PARTICIPATION LIMITATIONS: shopping and community activity  PERSONAL FACTORS: 1-2 comorbidities: HTN and Type 2 DM  are also affecting patient's functional outcome.   REHAB POTENTIAL: Good  CLINICAL DECISION MAKING: Stable/uncomplicated  EVALUATION COMPLEXITY: Low   GOALS: Goals reviewed with  patient? Yes  SHORT TERM GOALS: Target date: 06/23/2023   Patient will be compliant with initial HEP. Baseline: Goal status: MET 06/25/2023  2.  Patient will walk > or = 814ft during 6 minute walk test. Baseline: 89ft Goal status: MET 06/25/2023 948ft   LONG TERM GOALS: Target date: 07/21/2023   Patient will be compliant with advanced HEP. Baseline:  Goal status: INITIAL  2.  Patient will be able to safely attend aerobics classes at [facility]. Baseline:  Goal status: INITIAL  3.  Patient will achieve > or = to 16/24 on DGI to decrease risk of falls. Baseline: 14/24 Goal  status: INITIAL  4.  Patient will walk > or = 887ft during 6 minute walk test. Baseline: 834ft Goal status: MET 06/25/2023 950ft     PLAN:  PT FREQUENCY: 2x/week  PT DURATION: 8 weeks  PLANNED INTERVENTIONS: Therapeutic exercises, Therapeutic activity, Neuromuscular re-education, Balance training, Gait training, Patient/Family education, Self Care, Joint mobilization, and Dry Needling  PLAN FOR NEXT SESSION: progress note next treatment session; continue resisted walking; leg press   Claude Manges, PT 06/25/23 1:30 PM

## 2023-06-29 ENCOUNTER — Ambulatory Visit: Payer: Medicare Other | Admitting: Physical Therapy

## 2023-07-01 ENCOUNTER — Ambulatory Visit: Payer: Medicare Other | Admitting: Physical Therapy

## 2023-07-01 ENCOUNTER — Encounter: Payer: Self-pay | Admitting: Physical Therapy

## 2023-07-01 DIAGNOSIS — M6281 Muscle weakness (generalized): Secondary | ICD-10-CM

## 2023-07-01 DIAGNOSIS — R2689 Other abnormalities of gait and mobility: Secondary | ICD-10-CM

## 2023-07-01 NOTE — Therapy (Signed)
OUTPATIENT PHYSICAL THERAPY LOWER EXTREMITY TREATMENT/ PROGRESS NOTE   Patient Name: Ann Thompson MRN: [id number] DOB:December 21, 1940, 82 y.o., female Today's Date: 07/01/2023 Progress Note Reporting Period 05/26/2023 to 07/01/2023  See note below for Objective Data and Assessment of Progress/Goals.     END OF SESSION:  PT End of Session - 07/01/23 1327     Visit Number 10    Date for PT Re-Evaluation 07/21/23    Authorization Type Medicare Part A and B- KX modifier needed after visit 15    Progress Note Due on Visit 20    PT Start Time 1232    PT Stop Time 1315    PT Time Calculation (min) 43 min    Equipment Utilized During Treatment Gait belt    Activity Tolerance Patient tolerated treatment well    Behavior During Therapy WFL for tasks assessed/performed                      Past Medical History:  Diagnosis Date   Arthritis    DM type 2 (diabetes mellitus, type 2) (HCC)    GERD (gastroesophageal reflux disease)    History of kidney stones    Hyperlipemia    Hypertension    Left leg pain    Vision changes    Wears hearing aid in both ears    Past Surgical History:  Procedure Laterality Date   ABDOMINAL HYSTERECTOMY  1982   partial   APPENDECTOMY  1957   BACK SURGERY  07/12/2020   lower at [facility]   CYSTOSCOPY W/ URETERAL STENT PLACEMENT Right 10/06/2020   Procedure: CYSTOSCOPY WITH RETROGRADE PYELOGRAM/URETERAL STENT PLACEMENT;  Surgeon: Sebastian Ache, MD;  Location: [facility] OR;  Service: Urology;  Laterality: Right;   CYSTOSCOPY WITH RETROGRADE PYELOGRAM, URETEROSCOPY AND STENT PLACEMENT Right 10/26/2020   Procedure: CYSTOSCOPY WITH RETROGRADE PYELOGRAM, URETEROSCOPY AND STENT REPLACEMENT;  Surgeon: Sebastian Ache, MD;  Location: [facility];  Service: Urology;  Laterality: Right;  90 MINS   HOLMIUM LASER APPLICATION Right 10/26/2020   Procedure: HOLMIUM LASER APPLICATION;  Surgeon: Sebastian Ache, MD;  Location: [facility];  Service: Urology;  Laterality: Right;   JOINT REPLACEMENT  2016   both knees partial replacement   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   Pain of left hip joint 12/25/2020   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   Ureteral stone 10/06/2020   Ureteral stone with hydronephrosis 10/06/2020   Lumbar radiculopathy 08/23/2020   Hypertension    Disc displacement, 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   Trochanteric bursitis of left hip 03/22/2020   Type 2 diabetes mellitus (HCC) 02/17/2020   Status post bilateral knee replacements 06/07/2015   S/P bilateral unicompartmental knee replacement 03/01/2015    PCP: [name], NPRef Provider (PCP)   REFERRING PROVIDER: [name], Frazier Richards Provider (PCP)   REFERRING DIAG:  R26.81 (ICD-10-CM) - Gait instability  M25.50 (ICD-10-CM) - Multiple joint pain    THERAPY DIAG:  Muscle weakness (generalized)  Other abnormalities of gait and mobility  Rationale for Evaluation and Treatment: Rehabilitation  ONSET DATE: Gradual over a year  SUBJECTIVE:   SUBJECTIVE STATEMENT: Patient reports her legs are feeling weak today. She is currently having pain in her back, legs, and shoulders.   PERTINENT HISTORY: DM type 2, HTN, bilateral partial knee replacements 2016, hearing aids  PAIN: 4/10 07/01/2023 Are you having pain?  Achy pain  in multiple joints (back, hip, shoulders)  PRECAUTIONS: Other: Patient has a fear of falling  RED FLAGS: None   WEIGHT BEARING RESTRICTIONS: No  FALLS:  Has patient fallen in last 6 months? No  LIVING ENVIRONMENT: Lives with: lives with their family and lives with their spouse Lives in: House/apartment Stairs:  She has stairs in her house but she does not use them. In the community she is able to use do stairs with a railing if needed. Has following equipment at home:  Single [employer] she uses occasionally  in the community  OCCUPATION: Retired; Leisure: water aerobics  PLOF: Independent  PATIENT GOALS: To gain confidence in her balance to get back to water aerobics class and the golf course. She has not golfed in 3 years.  NEXT MD VISIT: PRN  OBJECTIVE:   DIAGNOSTIC FINDINGS: None   COGNITION: Overall cognitive status: Within functional limits for tasks assessed     MUSCLE LENGTH: Hamstrings: Right 30 deg; Left 55 deg  Lumbar ROM: Flexion: fingers to legs   Passive ROM Right eval Left eval  Hip flexion    Hip extension    Hip abduction    Hip adduction    Hip internal rotation    Hip external rotation 60 30  Knee flexion 112 118  Knee extension    Ankle dorsiflexion    Ankle plantarflexion    Ankle inversion    Ankle eversion     (Blank rows = not tested)  LOWER EXTREMITY MMT:  MMT Right eval Left eval  Hip flexion 4- 4-  Hip extension    Hip abduction 4+ 4+  Hip adduction 4 4  Hip internal rotation    Hip external rotation    Knee flexion 4- 3+  Knee extension 4- 3+  Ankle dorsiflexion    Ankle plantarflexion    Ankle inversion    Ankle eversion     (Blank rows = not tested)    FUNCTIONAL TESTS: SL Balance: More balance and control Rt compared to Lt  5 times sit to stand: 15.17secs; using bilateral armrests Timed up and go (TUG): 12.74sec 6 minute walk test: 857ft Dynamic Gait Index: 14/24= risk of falls  06/25/2023: 6 MWT: 960 ft  07/01/2023 TUG:10.95 5 STS:16.79 using bilateral UE support 5 STS :15.72 not using UE support GAIT: Distance walked: 814ft Assistive device utilized: None Level of assistance: Complete Independence Comments: short stride length, occasional scissor placement of Lt LE, looses balance when she turns her head   TODAY'S TREATMENT:                                                                                                                              DATE:  07/01/23: NuStep Level 4 6 mins- PT present to  discuss progress TUG:10.95 sec 5 STS:16.79 using bilateral UE support 5 STS :15.72 not using UE support 3 way SB stretch x 8 each way Y balance x 8 each  leg; min- mod hand held assist Hurdles (Forwards & Side ways) x 4 no UE support Airex Step Ups  x 10 each min UE support Narrow BOS x 30 sec Narrow BOS EC 2 x 30sec Manual: Addaday to lumbar paraspinals to increase blood flow. PT present to monitor response  06/25/23: NuStep Level 3 6 mins- PT present to discuss progress 6 MWT: 960 ft Seated Bicep Curls 1# 2 x 10 Airex Step Ups & Side Steps x 10 each Standing hip series on airex ( hip extension, hip abduction) 2 lb ankle weights  x 10  Hurdles (Forwards & Side ways) x 4 Unilateral UE support on barre Resisted Walking (forwards, backwards, sideways) 5 # x 4 each   06/23/23: NuStep Level 3 6 mins- PT present to discuss progress Step Ups 4 inch step x 10 each leg Side Step Ups 4 inch step x 10 each leg Standing hip series on airex ( hip extension, hip abduction) 2 lb ankle weights 2 x 10  Airex Side Steps 2# ankle weight x 10 Standing Y balance x 10 each leg Unilateral Farmer's Carries 3lb DB x 4 Obstacle Course (hurdles x 2, cobblestone x 2, airex) x 4 Hurdles (Forwards) x 4  STS x 10 Seated hamstring stretch 3 x 15 sec Seated piriformis stretch 3 x 20 sec Standing "L" stretch at parallel bar 3 x 10 sec     PATIENT EDUCATION:  Education details: Access Code: [id number] F Person educated: Patient Education method: Explanation, Demonstration, and Handouts Education comprehension: verbalized understanding and needs further education  HOME EXERCISE PROGRAM: Access Code: [email or web address] F URL: [email or web address] Date: 06/15/2023 Prepared by: Claude Manges  Exercises - Heel Raises with Counter Support  - 1 x daily - 7 x weekly - 1 sets - 10 reps - Sit to Stand with Armchair  - 1 x daily - 7 x weekly - 1 sets - 10 reps - Seated Long Arc Quad  - 1 x daily - 7 x weekly -  1 sets - 10 reps - 5 hold - Seated Toe Raise  - 1 x daily - 7 x weekly - 1 sets - 10 reps - Seated Heel Raise  - 1 x daily - 7 x weekly - 1 sets - 10 reps - Seated Knee Flexion with Anchored Resistance  - 1 x daily - 7 x weekly - 1 sets - 10 reps - Seated Hip Abduction with Resistance  - 1 x daily - 7 x weekly - 1 sets - 10 reps - Seated Hip Flexion March with Ankle Weights  - 1 x daily - 7 x weekly - 1 sets - 10 reps - Seated Single Arm Chest Press with Dumbbell  - 1 x daily - 7 x weekly - 1 sets - 10 reps - Seated Bicep Curls Supinated with Dumbbells  - 1 x daily - 7 x weekly - 1 sets - 8 reps  ASSESSMENT:  CLINICAL IMPRESSION: Completed 10th visit progress note today. Since starting therapy patient verbalizes her balance and back pain are the same. She has not been compliant with home exercises and verbalized that she needs to be better about doing them. Picking up objects from the ground, standing at her kitchen counter > 7 minutes, and unloading the dishwasher is still challenging for her. Based on reassessment patient has improved TUG time by 2 seconds. Based on her time she is not considered a fall risk for that test. Patient's 5 time sit to stand time was the same,  however patient was able to complete test without using UE support. Patient also improved time and walked 189ft more than eval. Patient continues to show objective improvements with physical therapy. Her back pain and compliance to HEP has been a limiting factor. Patient will benefit from skilled PT to address the below impairments and improve overall function.      OBJECTIVE IMPAIRMENTS: decreased activity tolerance, decreased balance, decreased endurance, decreased ROM, decreased strength, decreased safety awareness, impaired flexibility, and pain.   ACTIVITY LIMITATIONS: bending, standing, squatting, and stairs  PARTICIPATION LIMITATIONS: shopping and community activity  PERSONAL FACTORS: 1-2 comorbidities: HTN and  Type 2 DM  are also affecting patient's functional outcome.   REHAB POTENTIAL: Good  CLINICAL DECISION MAKING: Stable/uncomplicated  EVALUATION COMPLEXITY: Low   GOALS: Goals reviewed with patient? Yes  SHORT TERM GOALS: Target date: 06/23/2023   Patient will be compliant with initial HEP. Baseline: Goal status: MET 06/25/2023  2.  Patient will walk > or = 859ft during 6 minute walk test. Baseline: 832ft Goal status: MET 06/25/2023 9107ft   LONG TERM GOALS: Target date: 07/21/2023   Patient will be compliant with advanced HEP. Baseline:  Goal status: IN PROGRESS 07/04/2023  2.  Patient will be able to safely attend aerobics classes at [facility]. Baseline:  Goal status: IN PROGRESS 07/04/2023  3.  Patient will achieve > or = to 16/24 on DGI to decrease risk of falls. Baseline: 14/24 Goal status: IN PROGRESS 07/04/2023  4.  Patient will walk > or = 853ft during 6 minute walk test. Baseline: 856ft Goal status: MET 06/25/2023 984ft     PLAN:  PT FREQUENCY: 2x/week  PT DURATION: 8 weeks  PLANNED INTERVENTIONS: Therapeutic exercises, Therapeutic activity, Neuromuscular re-education, Balance training, Gait training, Patient/Family education, Self Care, Joint mobilization, and Dry Needling  PLAN FOR NEXT SESSION: leg press; resisted walking   Claude Manges, PT 07/01/23 1:28 PM

## 2023-07-03 NOTE — Therapy (Signed)
OUTPATIENT PHYSICAL THERAPY LOWER EXTREMITY TREATMENT  Patient Name: Ann Thompson MRN: [id number] DOB:September 15, 1940, 82 y.o., female Today's Date: 07/06/2023   END OF SESSION:  PT End of Session - 07/06/23 1400     Visit Number 11    Date for PT Re-Evaluation 07/21/23    Authorization Type Medicare Part A and B- KX modifier needed after visit 15    Progress Note Due on Visit 20    PT Start Time 1400    PT Stop Time 1444    PT Time Calculation (min) 44 min                       Past Medical History:  Diagnosis Date   Arthritis    DM type 2 (diabetes mellitus, type 2) (HCC)    GERD (gastroesophageal reflux disease)    History of kidney stones    Hyperlipemia    Hypertension    Left leg pain    Vision changes    Wears hearing aid in both ears    Past Surgical History:  Procedure Laterality Date   ABDOMINAL HYSTERECTOMY  1982   partial   APPENDECTOMY  1957   BACK SURGERY  07/12/2020   lower at [facility]   CYSTOSCOPY W/ URETERAL STENT PLACEMENT Right 10/06/2020   Procedure: CYSTOSCOPY WITH RETROGRADE PYELOGRAM/URETERAL STENT PLACEMENT;  Surgeon: Sebastian Ache, MD;  Location: [facility] OR;  Service: Urology;  Laterality: Right;   CYSTOSCOPY WITH RETROGRADE PYELOGRAM, URETEROSCOPY AND STENT PLACEMENT Right 10/26/2020   Procedure: CYSTOSCOPY WITH RETROGRADE PYELOGRAM, URETEROSCOPY AND STENT REPLACEMENT;  Surgeon: Sebastian Ache, MD;  Location: [facility];  Service: Urology;  Laterality: Right;  90 MINS   HOLMIUM LASER APPLICATION Right 10/26/2020   Procedure: HOLMIUM LASER APPLICATION;  Surgeon: Sebastian Ache, MD;  Location: [facility];  Service: Urology;  Laterality: Right;   JOINT REPLACEMENT  2016   both knees partial replacement   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   Pain of left hip joint 12/25/2020   Post laminectomy  syndrome 11/27/2020   Ureteral stone 10/06/2020   Ureteral stone with hydronephrosis 10/06/2020   Lumbar radiculopathy 08/23/2020   Hypertension    Disc displacement, 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   Trochanteric bursitis of left hip 03/22/2020   Type 2 diabetes mellitus (HCC) 02/17/2020   Status post bilateral knee replacements 06/07/2015   S/P bilateral unicompartmental knee replacement 03/01/2015    PCP: [name], NPRef Provider (PCP)   REFERRING PROVIDER: [name], Frazier Richards Provider (PCP)   REFERRING DIAG:  R26.81 (ICD-10-CM) - Gait instability  M25.50 (ICD-10-CM) - Multiple joint pain    THERAPY DIAG:  Muscle weakness (generalized)  Other abnormalities of gait and mobility  Rationale for Evaluation and Treatment: Rehabilitation  ONSET DATE: Gradual over a year  SUBJECTIVE:   SUBJECTIVE STATEMENT: I'm sore all over.   PERTINENT HISTORY: DM type 2, HTN, bilateral partial knee replacements 2016, hearing aids  PAIN: 6-7/10 10/28 Are you having pain?  Achy pain in multiple joints (back, hip, shoulders)  PRECAUTIONS: Other: Patient has a fear of falling  RED FLAGS: None   WEIGHT BEARING RESTRICTIONS: No  FALLS:  Has patient fallen in last 6 months? No  LIVING ENVIRONMENT: Lives with: lives with their family and lives with their spouse Lives in: House/apartment Stairs:  She has stairs in her house but she does not  use them. In the community she is able to use do stairs with a railing if needed. Has following equipment at home:  Single [employer] she uses occasionally in the community  OCCUPATION: Retired; Leisure: water aerobics  PLOF: Independent  PATIENT GOALS: To gain confidence in her balance to get back to water aerobics class and the golf course. She has not golfed in 3 years.  NEXT MD VISIT: PRN  OBJECTIVE:   DIAGNOSTIC FINDINGS: None   COGNITION: Overall cognitive status: Within functional limits for tasks assessed     MUSCLE  LENGTH: Hamstrings: Right 30 deg; Left 55 deg  Lumbar ROM: Flexion: fingers to legs   Passive ROM Right eval Left eval  Hip flexion    Hip extension    Hip abduction    Hip adduction    Hip internal rotation    Hip external rotation 60 30  Knee flexion 112 118  Knee extension    Ankle dorsiflexion    Ankle plantarflexion    Ankle inversion    Ankle eversion     (Blank rows = not tested)  LOWER EXTREMITY MMT:  MMT Right eval Left eval  Hip flexion 4- 4-  Hip extension    Hip abduction 4+ 4+  Hip adduction 4 4  Hip internal rotation    Hip external rotation    Knee flexion 4- 3+  Knee extension 4- 3+  Ankle dorsiflexion    Ankle plantarflexion    Ankle inversion    Ankle eversion     (Blank rows = not tested)    FUNCTIONAL TESTS: SL Balance: More balance and control Rt compared to Lt  5 times sit to stand: 15.17secs; using bilateral armrests Timed up and go (TUG): 12.74sec 6 minute walk test: 885ft Dynamic Gait Index: 14/24= risk of falls  06/25/2023: 6 MWT: 960 ft  07/01/2023 TUG:10.95 5 STS:16.79 using bilateral UE support 5 STS :15.72 not using UE support GAIT: Distance walked: 872ft Assistive device utilized: None Level of assistance: Complete Independence Comments: short stride length, occasional scissor placement of Lt LE, looses balance when she turns her head   TODAY'S TREATMENT:                                                                                                                              DATE:  07/01/23: NuStep Level 5 6 mins- PT present to discuss progress 3 way SB stretch x 8 each way Seated fig 4 2x 30 sec ea STS x 1 10 :not using UE support Y balance x 8 each leg; min- mod hand held assist Hurdles (Forwards & Side ways) x 4 no UE support Airex Step Ups  x 10 each min UE support Airex normal and Narrow BOS with head turns horizontal Narrow BOS EC 2 x 30sec Tandem stance x 30 sec B SKTC 2x 20 sec B using towel Leg  press 40# 3x10 (increase next visit) Resisted walking with blue  TB fwd and bwd x 4 laps Manual: Addaday to lumbar paraspinals to increase blood flow. PT present to monitor response  07/01/23: NuStep Level 4 6 mins- PT present to discuss progress TUG:10.95 sec 5 STS:16.79 using bilateral UE support 5 STS :15.72 not using UE support 3 way SB stretch x 8 each way Y balance x 8 each leg; min- mod hand held assist Hurdles (Forwards & Side ways) x 4 no UE support Airex Step Ups  x 10 each min UE support Narrow BOS x 30 sec Narrow BOS EC 2 x 30sec Manual: Addaday to lumbar paraspinals to increase blood flow. PT present to monitor response  06/25/23: NuStep Level 3 6 mins- PT present to discuss progress 6 MWT: 960 ft Seated Bicep Curls 1# 2 x 10 Airex Step Ups & Side Steps x 10 each Standing hip series on airex ( hip extension, hip abduction) 2 lb ankle weights  x 10  Hurdles (Forwards & Side ways) x 4 Unilateral UE support on barre Resisted Walking (forwards, backwards, sideways) 5 # x 4 each   06/23/23: NuStep Level 3 6 mins- PT present to discuss progress Step Ups 4 inch step x 10 each leg Side Step Ups 4 inch step x 10 each leg Standing hip series on airex ( hip extension, hip abduction) 2 lb ankle weights 2 x 10  Airex Side Steps 2# ankle weight x 10 Standing Y balance x 10 each leg Unilateral Farmer's Carries 3lb DB x 4 Obstacle Course (hurdles x 2, cobblestone x 2, airex) x 4 Hurdles (Forwards) x 4  STS x 10 Seated hamstring stretch 3 x 15 sec Seated piriformis stretch 3 x 20 sec Standing "L" stretch at parallel bar 3 x 10 sec     PATIENT EDUCATION:  Education details: Access Code: [id number] F Person educated: Patient Education method: Explanation, Demonstration, and Handouts Education comprehension: verbalized understanding and needs further education  HOME EXERCISE PROGRAM: Access Code: [email or web address] F URL: [email or web address] Date: 06/15/2023 Prepared  by: Claude Manges  Exercises - Heel Raises with Counter Support  - 1 x daily - 7 x weekly - 1 sets - 10 reps - Sit to Stand with Armchair  - 1 x daily - 7 x weekly - 1 sets - 10 reps - Seated Long Arc Quad  - 1 x daily - 7 x weekly - 1 sets - 10 reps - 5 hold - Seated Toe Raise  - 1 x daily - 7 x weekly - 1 sets - 10 reps - Seated Heel Raise  - 1 x daily - 7 x weekly - 1 sets - 10 reps - Seated Knee Flexion with Anchored Resistance  - 1 x daily - 7 x weekly - 1 sets - 10 reps - Seated Hip Abduction with Resistance  - 1 x daily - 7 x weekly - 1 sets - 10 reps - Seated Hip Flexion March with Ankle Weights  - 1 x daily - 7 x weekly - 1 sets - 10 reps - Seated Single Arm Chest Press with Dumbbell  - 1 x daily - 7 x weekly - 1 sets - 10 reps - Seated Bicep Curls Supinated with Dumbbells  - 1 x daily - 7 x weekly - 1 sets - 8 reps  ASSESSMENT:  CLINICAL IMPRESSION: Ann Thompson had increased soreness today after starting water aerobics last Thursday. We did some stretching to help loosen her up which helped. PT advised she do stretches after she gets out of the pool to  reduce post exercise soreness. Able to tolerate  leg press and resisted walking with theraband without complaints of pain.       OBJECTIVE IMPAIRMENTS: decreased activity tolerance, decreased balance, decreased endurance, decreased ROM, decreased strength, decreased safety awareness, impaired flexibility, and pain.   ACTIVITY LIMITATIONS: bending, standing, squatting, and stairs  PARTICIPATION LIMITATIONS: shopping and community activity  PERSONAL FACTORS: 1-2 comorbidities: HTN and Type 2 DM  are also affecting patient's functional outcome.   REHAB POTENTIAL: Good  CLINICAL DECISION MAKING: Stable/uncomplicated  EVALUATION COMPLEXITY: Low   GOALS: Goals reviewed with patient? Yes  SHORT TERM GOALS: Target date: 06/23/2023   Patient will be compliant with initial HEP. Baseline: Goal status: MET 06/25/2023  2.  Patient  will walk > or = 876ft during 6 minute walk test. Baseline: 835ft Goal status: MET 06/25/2023 961ft   LONG TERM GOALS: Target date: 07/21/2023   Patient will be compliant with advanced HEP. Baseline:  Goal status: IN PROGRESS 07/04/2023  2.  Patient will be able to safely attend aerobics classes at [facility]. Baseline:  Goal status: IN PROGRESS 07/04/2023  3.  Patient will achieve > or = to 16/24 on DGI to decrease risk of falls. Baseline: 14/24 Goal status: IN PROGRESS 07/04/2023  4.  Patient will walk > or = 842ft during 6 minute walk test. Baseline: 841ft Goal status: MET 06/25/2023 96ft     PLAN:  PT FREQUENCY: 2x/week  PT DURATION: 8 weeks  PLANNED INTERVENTIONS: Therapeutic exercises, Therapeutic activity, Neuromuscular re-education, Balance training, Gait training, Patient/Family education, Self Care, Joint mobilization, and Dry Needling  PLAN FOR NEXT SESSION: leg press; resisted walking   Solon Palm, PT  07/06/23 2:48 PM

## 2023-07-06 ENCOUNTER — Encounter: Payer: Self-pay | Admitting: Physical Therapy

## 2023-07-06 ENCOUNTER — Ambulatory Visit: Payer: Medicare Other | Admitting: Physical Therapy

## 2023-07-06 DIAGNOSIS — R2689 Other abnormalities of gait and mobility: Secondary | ICD-10-CM

## 2023-07-06 DIAGNOSIS — M6281 Muscle weakness (generalized): Secondary | ICD-10-CM | POA: Diagnosis not present

## 2023-07-08 ENCOUNTER — Encounter: Payer: Medicare Other | Admitting: Physical Therapy

## 2023-07-09 ENCOUNTER — Ambulatory Visit: Payer: Medicare Other | Admitting: Physical Therapy

## 2023-07-09 ENCOUNTER — Telehealth: Payer: Self-pay | Admitting: Physical Therapy

## 2023-07-09 ENCOUNTER — Encounter: Payer: Medicare Other | Admitting: Physical Therapy

## 2023-07-09 NOTE — Telephone Encounter (Signed)
Spoke with patient about missed appointment on 07/09/2023 at 8:45 am. Patient verbalized that she thought she cancelled the appointment. She is going to water aerobics and did not want to do two appointments in one day. She will be at her next appointment 11/4 at 12:30pm.  Claude Manges, PT 07/09/23 9:04 AM

## 2023-07-13 ENCOUNTER — Encounter: Payer: Self-pay | Admitting: Physical Therapy

## 2023-07-13 ENCOUNTER — Ambulatory Visit: Payer: Medicare Other | Attending: Adult Health | Admitting: Physical Therapy

## 2023-07-13 DIAGNOSIS — M6281 Muscle weakness (generalized): Secondary | ICD-10-CM | POA: Diagnosis present

## 2023-07-13 DIAGNOSIS — R2689 Other abnormalities of gait and mobility: Secondary | ICD-10-CM | POA: Insufficient documentation

## 2023-07-13 NOTE — Therapy (Addendum)
OUTPATIENT PHYSICAL THERAPY LOWER EXTREMITY TREATMENT/ DISCHARGE NOTE  Patient Name: Ann Thompson MRN: [id number] DOB:Jun 01, 1941, 82 y.o., female Today's Date: 07/13/2023   END OF SESSION:  PT End of Session - 07/13/23 1325     Visit Number 12    Date for PT Re-Evaluation 07/21/23    Authorization Type Medicare Part A and B- KX modifier needed after visit 15    Progress Note Due on Visit 20    PT Start Time 1231    PT Stop Time 1313    PT Time Calculation (min) 42 min    Activity Tolerance Patient tolerated treatment well    Behavior During Therapy WFL for tasks assessed/performed                        Past Medical History:  Diagnosis Date   Arthritis    DM type 2 (diabetes mellitus, type 2) (HCC)    GERD (gastroesophageal reflux disease)    History of kidney stones    Hyperlipemia    Hypertension    Left leg pain    Vision changes    Wears hearing aid in both ears    Past Surgical History:  Procedure Laterality Date   ABDOMINAL HYSTERECTOMY  1982   partial   APPENDECTOMY  1957   BACK SURGERY  07/12/2020   lower at [facility]   CYSTOSCOPY W/ URETERAL STENT PLACEMENT Right 10/06/2020   Procedure: CYSTOSCOPY WITH RETROGRADE PYELOGRAM/URETERAL STENT PLACEMENT;  Surgeon: Sebastian Ache, MD;  Location: [facility] OR;  Service: Urology;  Laterality: Right;   CYSTOSCOPY WITH RETROGRADE PYELOGRAM, URETEROSCOPY AND STENT PLACEMENT Right 10/26/2020   Procedure: CYSTOSCOPY WITH RETROGRADE PYELOGRAM, URETEROSCOPY AND STENT REPLACEMENT;  Surgeon: Sebastian Ache, MD;  Location: [facility];  Service: Urology;  Laterality: Right;  90 MINS   HOLMIUM LASER APPLICATION Right 10/26/2020   Procedure: HOLMIUM LASER APPLICATION;  Surgeon: Sebastian Ache, MD;  Location: [facility];  Service: Urology;  Laterality: Right;   JOINT REPLACEMENT  2016   both knees partial replacement   Patient Active Problem List   Diagnosis Date Noted   Mass of  lower limb 05/27/2021   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   Pain of left hip joint 12/25/2020   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   Ureteral stone 10/06/2020   Ureteral stone with hydronephrosis 10/06/2020   Lumbar radiculopathy 08/23/2020   Hypertension    Disc displacement, 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   Trochanteric bursitis of left hip 03/22/2020   Type 2 diabetes mellitus (HCC) 02/17/2020   Status post bilateral knee replacements 06/07/2015   S/P bilateral unicompartmental knee replacement 03/01/2015    PCP: [name], NPRef Provider (PCP)   REFERRING PROVIDER: [name], Frazier Richards Provider (PCP)   REFERRING DIAG:  R26.81 (ICD-10-CM) - Gait instability  M25.50 (ICD-10-CM) - Multiple joint pain    THERAPY DIAG:  Muscle weakness (generalized)  Other abnormalities of gait and mobility  Rationale for Evaluation and Treatment: Rehabilitation  ONSET DATE: Gradual over a year  SUBJECTIVE:   SUBJECTIVE STATEMENT: Patient reports she is doing okay today. She has not noticed any improvement in her back, knee , or shoulder pain. She is currently having 5/10 pain.  PERTINENT HISTORY: DM type 2, HTN, bilateral partial knee replacements 2016, hearing aids  PAIN: 5/10 07/13/2023 Are you having pain?  Achy pain in multiple joints (back, hip, shoulders)  PRECAUTIONS: Other: Patient has a fear of falling  RED FLAGS: None  WEIGHT BEARING RESTRICTIONS: No  FALLS:  Has patient fallen in last 6 months? No  LIVING ENVIRONMENT: Lives with: lives with their family and lives with their spouse Lives in: House/apartment Stairs:  She has stairs in her house but she does not use them. In the community she is able to use do stairs with a railing if needed. Has following equipment at home:  Single [employer] she uses occasionally in the community  OCCUPATION: Retired; Leisure: water aerobics  PLOF: Independent  PATIENT GOALS: To gain confidence in her balance to get  back to water aerobics class and the golf course. She has not golfed in 3 years.  NEXT MD VISIT: PRN  OBJECTIVE:   DIAGNOSTIC FINDINGS: None   COGNITION: Overall cognitive status: Within functional limits for tasks assessed     MUSCLE LENGTH: Hamstrings: Right 30 deg; Left 55 deg  Lumbar ROM: Flexion: fingers to legs   Passive ROM Right eval Left eval  Hip flexion    Hip extension    Hip abduction    Hip adduction    Hip internal rotation    Hip external rotation 60 30  Knee flexion 112 118  Knee extension    Ankle dorsiflexion    Ankle plantarflexion    Ankle inversion    Ankle eversion     (Blank rows = not tested)  LOWER EXTREMITY MMT:  MMT Right eval Left eval  Hip flexion 4- 4-  Hip extension    Hip abduction 4+ 4+  Hip adduction 4 4  Hip internal rotation    Hip external rotation    Knee flexion 4- 3+  Knee extension 4- 3+  Ankle dorsiflexion    Ankle plantarflexion    Ankle inversion    Ankle eversion     (Blank rows = not tested)    FUNCTIONAL TESTS: SL Balance: More balance and control Rt compared to Lt  5 times sit to stand: 15.17secs; using bilateral armrests Timed up and go (TUG): 12.74sec 6 minute walk test: 823ft Dynamic Gait Index: 14/24= risk of falls  06/25/2023: 6 MWT: 960 ft  07/01/2023 TUG:10.95 5 STS:16.79 using bilateral UE support 5 STS :15.72 not using UE support GAIT: Distance walked: 860ft Assistive device utilized: None Level of assistance: Complete Independence Comments: short stride length, occasional scissor placement of Lt LE, looses balance when she turns her head   TODAY'S TREATMENT:                                                                                                                              DATE:  07/13/23: NuStep Level 5 6 mins- PT present to discuss progress 3 way SB stretch x 8 each way Seated hamstring stretch 2 x 30 sec STS x 1 10 :not using UE support Seated marching & LAQ 2# AW 2  x 10 each Cone taps x 8 each Cone weaving x2 + retrieval Cone in and outs x 2  Narrow BOS on airex 2 x 30 Resisted Walking 5# at cable column  (sideways; backwards) x 4 each direction Manual: Addaday to lumbar paraspinals to increase blood flow. PT present to monitor response   07/01/23: NuStep Level 5 6 mins- PT present to discuss progress 3 way SB stretch x 8 each way Seated fig 4 2x 30 sec ea STS x 1 10 :not using UE support Y balance x 8 each leg; min- mod hand held assist Hurdles (Forwards & Side ways) x 4 no UE support Airex Step Ups  x 10 each min UE support Airex normal and Narrow BOS with head turns horizontal Narrow BOS EC 2 x 30sec Tandem stance x 30 sec B SKTC 2x 20 sec B using towel Leg press 40# 3x10 (increase next visit) Resisted walking with blue TB fwd and bwd x 4 laps Manual: Addaday to lumbar paraspinals to increase blood flow. PT present to monitor response  07/01/23: NuStep Level 4 6 mins- PT present to discuss progress TUG:10.95 sec 5 STS:16.79 using bilateral UE support 5 STS :15.72 not using UE support 3 way SB stretch x 8 each way Y balance x 8 each leg; min- mod hand held assist Hurdles (Forwards & Side ways) x 4 no UE support Airex Step Ups  x 10 each min UE support Narrow BOS x 30 sec Narrow BOS EC 2 x 30sec Manual: Addaday to lumbar paraspinals to increase blood flow. PT present to monitor response     PATIENT EDUCATION:  Education details: Access Code: [email or web address] F Person educated: Patient Education method: Explanation, Demonstration, and Handouts Education comprehension: verbalized understanding and needs further education  HOME EXERCISE PROGRAM: Access Code: [email or web address] F URL: [email or web address] Date: 06/15/2023 Prepared by: Claude Manges  Exercises - Heel Raises with Counter Support  - 1 x daily - 7 x weekly - 1 sets - 10 reps - Sit to Stand with Armchair  - 1 x daily - 7 x weekly - 1 sets - 10 reps - Seated Long Arc Quad   - 1 x daily - 7 x weekly - 1 sets - 10 reps - 5 hold - Seated Toe Raise  - 1 x daily - 7 x weekly - 1 sets - 10 reps - Seated Heel Raise  - 1 x daily - 7 x weekly - 1 sets - 10 reps - Seated Knee Flexion with Anchored Resistance  - 1 x daily - 7 x weekly - 1 sets - 10 reps - Seated Hip Abduction with Resistance  - 1 x daily - 7 x weekly - 1 sets - 10 reps - Seated Hip Flexion March with Ankle Weights  - 1 x daily - 7 x weekly - 1 sets - 10 reps - Seated Single Arm Chest Press with Dumbbell  - 1 x daily - 7 x weekly - 1 sets - 10 reps - Seated Bicep Curls Supinated with Dumbbells  - 1 x daily - 7 x weekly - 1 sets - 8 reps  ASSESSMENT:  CLINICAL IMPRESSION: Today's treatment session focused on LE strengthening and balance. Patient tolerated treatment session well. She had increased back pain after picking up cones. With cone taps patient required minimal hand held UE support . No losses of balance noted on balance exercises. Patient responded favorable to manual therapy. Patient will benefit from skilled PT to address the below impairments and improve overall function.      OBJECTIVE IMPAIRMENTS: decreased activity tolerance, decreased balance, decreased endurance, decreased ROM, decreased strength, decreased  safety awareness, impaired flexibility, and pain.   ACTIVITY LIMITATIONS: bending, standing, squatting, and stairs  PARTICIPATION LIMITATIONS: shopping and community activity  PERSONAL FACTORS: 1-2 comorbidities: HTN and Type 2 DM  are also affecting patient's functional outcome.   REHAB POTENTIAL: Good  CLINICAL DECISION MAKING: Stable/uncomplicated  EVALUATION COMPLEXITY: Low   GOALS: Goals reviewed with patient? Yes  SHORT TERM GOALS: Target date: 06/23/2023   Patient will be compliant with initial HEP. Baseline: Goal status: MET 06/25/2023  2.  Patient will walk > or = 896ft during 6 minute walk test. Baseline: 855ft Goal status: MET 06/25/2023 987ft   LONG TERM  GOALS: Target date: 07/21/2023   Patient will be compliant with advanced HEP. Baseline:  Goal status: MET 07/13/2023  2.  Patient will be able to safely attend aerobics classes at [facility]. Baseline:  Goal status: MET 07/13/2023  3.  Patient will achieve > or = to 16/24 on DGI to decrease risk of falls. Baseline: 14/24 Goal status: NOT MET 07/21/2023  4.  Patient will walk > or = 849ft during 6 minute walk test. Baseline: 826ft Goal status: MET 06/25/2023 933ft     PLAN:  PT FREQUENCY: 2x/week  PT DURATION: 8 weeks  PLANNED INTERVENTIONS: Therapeutic exercises, Therapeutic activity, Neuromuscular re-education, Balance training, Gait training, Patient/Family education, Self Care, Joint mobilization, and Dry Needling  PLAN FOR NEXT SESSION: leg press continue balance on unstable surface  PHYSICAL THERAPY DISCHARGE SUMMARY  Visits from Start of Care: 11  Current functional level related to goals / functional outcomes: See above. Since starting therapy Indeya has started back going to aquatics at [place] and that was her main goal to accomplish. She has met all of her goals except one at this time.   Remaining deficits: See above   Education / Equipment: See above   Patient agrees to discharge. Patient goals were partially met. Patient is being discharged due to being pleased with the current functional level.   Claude Manges, PT 07/13/23 1:26 PM

## 2023-07-15 ENCOUNTER — Ambulatory Visit (INDEPENDENT_AMBULATORY_CARE_PROVIDER_SITE_OTHER): Payer: Medicare Other | Admitting: Adult Health

## 2023-07-15 ENCOUNTER — Telehealth: Payer: Self-pay

## 2023-07-15 ENCOUNTER — Encounter: Payer: Self-pay | Admitting: Adult Health

## 2023-07-15 ENCOUNTER — Ambulatory Visit: Payer: Medicare Other

## 2023-07-15 VITALS — BP 120/80 | HR 66 | Temp 98.2°F | Ht 60.0 in | Wt 178.0 lb

## 2023-07-15 DIAGNOSIS — H8113 Benign paroxysmal vertigo, bilateral: Secondary | ICD-10-CM

## 2023-07-15 DIAGNOSIS — J302 Other seasonal allergic rhinitis: Secondary | ICD-10-CM | POA: Diagnosis not present

## 2023-07-15 MED ORDER — LORATADINE 10 MG PO TABS
10.0000 mg | ORAL_TABLET | Freq: Every day | ORAL | 3 refills | Status: AC
Start: 2023-07-15 — End: ?

## 2023-07-15 MED ORDER — MECLIZINE HCL 25 MG PO TABS: 25.0000 mg | ORAL_TABLET | Freq: Three times a day (TID) | ORAL | 0 refills | Status: DC | PRN

## 2023-07-15 NOTE — Patient Instructions (Signed)
Your symptoms are consistent with vertigo.   I have sent in a medication called Meclizine to help with your symptoms.   I have also sent in Claritin as an allergy medication. Take this daily

## 2023-07-15 NOTE — Telephone Encounter (Signed)
Call placed to inform patient that she missed her appt today at 12:30 pm.  She was at a doctors appt at the time.  She thought her appt was on Friday at 2:30.  She apologized and states she will be here for her next appt.  Provided next appt date and time.

## 2023-07-15 NOTE — Progress Notes (Signed)
Subjective:    Patient ID: Ann Thompson, female    DOB: Jan 28, 1941, 82 y.o.   MRN: [id number]  HPI  82 year old female who  has a past medical history of Arthritis, DM type 2 (diabetes mellitus, type 2) (HCC), GERD (gastroesophageal reflux disease), History of kidney stones, Hyperlipemia, Hypertension, Left leg pain, Vision changes, and Wears hearing aid in both ears.  She presents to the office today for an acute visit.  She reports that she woke up yesterday morning and she felt dizzy, like the world was spinning around her.  This dizziness lasted all day yesterday.  When she woke up this morning she felt the same way.  She feels as though both of her ears are stopped up and has some sinus pressure.  She has not had any fevers, chills, nausea, or vomiting.   Review of Systems See HPI   Past Medical History:  Diagnosis Date   Arthritis    DM type 2 (diabetes mellitus, type 2) (HCC)    GERD (gastroesophageal reflux disease)    History of kidney stones    Hyperlipemia    Hypertension    Left leg pain    Vision changes    Wears hearing aid in both ears     Social History   Socioeconomic History   Marital status: Married    Spouse name: Not on file   Number of children: 2   Years of education: Not on file   Highest education level: GED or equivalent  Occupational History   Occupation: retired  Tobacco Use   Smoking status: Former    Current packs/day: 0.00    Average packs/day: 1 pack/day for 25.0 years (25.0 ttl pk-yrs)    Types: Cigarettes    Start date: 09/08/1956    Quit date: 09/08/1981    Years since quitting: 41.8   Smokeless tobacco: Never  Vaping Use   Vaping status: Never Used  Substance and Sexual Activity   Alcohol use: Yes    Alcohol/week: 4.0 standard drinks of alcohol    Types: 4 Glasses of wine per week   Drug use: Never   Sexual activity: Not on file  Other Topics Concern   Not on file  Social History Narrative   Not on file   Social Determinants  of Health   Financial Resource Strain: Low Risk  (04/14/2023)   Overall Financial Resource Strain (CARDIA)    Difficulty of Paying Living Expenses: Not hard at all  Food Insecurity: No Food Insecurity (04/14/2023)   Hunger Vital Sign    Worried About Running Out of Food in the Last Year: Never true    Ran Out of Food in the Last Year: Never true  Transportation Needs: No Transportation Needs (04/14/2023)   PRAPARE - Administrator, Civil Service (Medical): No    Lack of Transportation (Non-Medical): No  Physical Activity: Sufficiently Active (04/14/2023)   Exercise Vital Sign    Days of Exercise per Week: 3 days    Minutes of Exercise per Session: 60 min  Stress: No Stress Concern Present (04/14/2023)   [employer] of Occupational Health - Occupational Stress Questionnaire    Feeling of Stress : Not at all  Social Connections: Moderately Isolated (04/14/2023)   Social Connection and Isolation Panel [NHANES]    Frequency of Communication with Friends and Family: More than three times a week    Frequency of Social Gatherings with Friends and Family: More than three times a  week    Attends Religious Services: Never    Active Member of Clubs or Organizations: No    Attends Banker Meetings: Never    Marital Status: Married  Catering manager Violence: Not At Risk (04/14/2023)   Humiliation, Afraid, Rape, and Kick questionnaire    Fear of Current or Ex-Partner: No    Emotionally Abused: No    Physically Abused: No    Sexually Abused: No    Past Surgical History:  Procedure Laterality Date   ABDOMINAL HYSTERECTOMY  1982   partial   APPENDECTOMY  1957   BACK SURGERY  07/12/2020   lower at [facility]   CYSTOSCOPY W/ URETERAL STENT PLACEMENT Right 10/06/2020   Procedure: CYSTOSCOPY WITH RETROGRADE PYELOGRAM/URETERAL STENT PLACEMENT;  Surgeon: Sebastian Ache, MD;  Location: [facility] OR;  Service: Urology;  Laterality: Right;   CYSTOSCOPY WITH RETROGRADE  PYELOGRAM, URETEROSCOPY AND STENT PLACEMENT Right 10/26/2020   Procedure: CYSTOSCOPY WITH RETROGRADE PYELOGRAM, URETEROSCOPY AND STENT REPLACEMENT;  Surgeon: Sebastian Ache, MD;  Location: [facility];  Service: Urology;  Laterality: Right;  90 MINS   HOLMIUM LASER APPLICATION Right 10/26/2020   Procedure: HOLMIUM LASER APPLICATION;  Surgeon: Sebastian Ache, MD;  Location: [facility];  Service: Urology;  Laterality: Right;   JOINT REPLACEMENT  2016   both knees partial replacement    Family History  Problem Relation Age of Onset   Liver disease Neg Hx    Esophageal cancer Neg Hx    Colon cancer Neg Hx     Allergies  Allergen Reactions   Diclofenac Swelling    1Suspected to have caused hives 2015-- had allergy testing  1Suspected to have caused hives 2015-- had allergy testing                                                    Tongue swelling  1Suspected to have caused hives 2015-- had allergy testing  1Suspected to have caused hives 2015-- had allergy testing                                                    Tongue swelling   Simvastatin     Mylagia    Metformin Other (See Comments)    Current Outpatient Medications on File Prior to Visit  Medication Sig Dispense Refill   acetaminophen (TYLENOL) 325 MG tablet      bisoprolol-hydrochlorothiazide (ZIAC) 2.5-6.25 MG tablet TAKE 1 TABLET BY MOUTH EVERY DAY 90 tablet 1   celecoxib (CELEBREX) 200 MG capsule TAKE 1 CAPSULE BY MOUTH EVERY DAY 90 capsule 1   cycloSPORINE (RESTASIS) 0.05 % ophthalmic emulsion      FLAREX 0.1 % ophthalmic suspension Apply to eye.     fluticasone (FLONASE) 50 MCG/ACT nasal spray USE 2 SPRAYS IN EACH NOSTRIL EVERY DAY 16 g 5   glipiZIDE (GLUCOTROL XL) 2.5 MG 24 hr tablet TAKE 1 TABLET BY MOUTH EVERY DAY WITH BREAKFAST 90 tablet 1   Glucosamine-Chondroit-Vit C-Mn (GLUCOSAMINE 1500 COMPLEX) CAPS Take 1 capsule by mouth daily.     JANUVIA 50 MG tablet TAKE 1 TABLET BY MOUTH  EVERY DAY 90 tablet 1   Multiple Vitamins-Minerals (PRESERVISION  AREDS PO) Take 1 tablet by mouth daily.     Polyvinyl Alcohol-Povidone PF (REFRESH) 1.4-0.6 % SOLN      Potassium 75 MG TABS potassium     pramipexole (MIRAPEX) 1 MG tablet TAKE 2 TABLETS BY MOUTH AT BEDTIME 180 tablet 1   sertraline (ZOLOFT) 50 MG tablet Take 1 tablet (50 mg total) by mouth daily. 90 tablet 1   Tetrahydrozoline-Zn Sulfate (EYE DROPS A/C OP) Apply 1 drop to eye daily as needed (dry eyes).     triamcinolone cream (KENALOG) 0.1 % 1 application     triamterene-hydrochlorothiazide (MAXZIDE-25) 37.5-25 MG tablet TAKE 1 TABLET BY MOUTH EVERY DAY 90 tablet 3   UNABLE TO FIND Med Name: cbd oil to knees prn     vitamin B-12 (CYANOCOBALAMIN) 100 MCG tablet Vitamin B12     No current facility-administered medications on file prior to visit.    BP 120/80   Pulse 66   Temp 98.2 F (36.8 C) (Oral)   Ht 5' (1.524 m)   Wt 178 lb (80.7 kg)   SpO2 96%   BMI 34.76 kg/m       Objective:   Physical Exam Vitals and nursing note reviewed.  Constitutional:      Appearance: Normal appearance.  HENT:     Right Ear: Tympanic membrane, ear canal and external ear normal. Decreased hearing (chronic) noted.     Left Ear: Tympanic membrane, ear canal and external ear normal. Decreased hearing (chronic) noted.     Nose: No rhinorrhea.     Right Turbinates: Swollen.     Left Turbinates: Swollen.  Eyes:     Extraocular Movements:     Right eye: Nystagmus (horizontal) present.     Left eye: Nystagmus (horizontal) present.  Cardiovascular:     Rate and Rhythm: Normal rate and regular rhythm.     Pulses: Normal pulses.     Heart sounds: Normal heart sounds.  Skin:    General: Skin is warm and dry.  Neurological:     General: No focal deficit present.     Mental Status: She is alert and oriented to person, place, and time.     Cranial Nerves: Cranial nerves 2-12 are intact.     Sensory: Sensation is intact.     Motor:  Motor function is intact.     Coordination: Coordination is intact.     Gait: Gait is intact.  Psychiatric:        Mood and Affect: Mood normal.        Behavior: Behavior normal.        Thought Content: Thought content normal.        Judgment: Judgment normal.       Assessment & Plan:  1. Benign paroxysmal positional vertigo due to bilateral vestibular disorder -Exam consistent with BPPV.  No signs of acute CVA.  Her BPPV is likely allergy induced.  Will prescribe short course of meclizine that she can take as needed and start her on Claritin daily.  She was advised to follow-up if no improvement in the next 2 to 3 days. - meclizine (ANTIVERT) 25 MG tablet; Take 1 tablet (25 mg total) by mouth 3 (three) times daily as needed for dizziness.  Dispense: 30 tablet; Refill: 0  2. Seasonal allergies  - loratadine (CLARITIN) 10 MG tablet; Take 1 tablet (10 mg total) by mouth daily.  Dispense: 90 tablet; Refill: 3   Time spent with patient today was 32  minutes which consisted  of chart review, discussing  BPPV and allergies,  work up, treatment answering questions and documentation.

## 2023-07-21 ENCOUNTER — Ambulatory Visit: Payer: Medicare Other | Admitting: Physical Therapy

## 2023-07-21 ENCOUNTER — Telehealth: Payer: Self-pay | Admitting: Physical Therapy

## 2023-07-21 NOTE — Telephone Encounter (Signed)
Phoned patient due to missed appointment. Let patient know that this was her last scheduled appt. Asked her to call and let us know if she is ready to discharge or wants to return to PT.

## 2023-08-18 ENCOUNTER — Ambulatory Visit: Payer: Medicare Other | Admitting: Adult Health

## 2023-08-18 ENCOUNTER — Encounter: Payer: Self-pay | Admitting: Adult Health

## 2023-08-18 VITALS — BP 122/86 | HR 56 | Temp 97.7°F | Ht 60.0 in | Wt 179.0 lb

## 2023-08-18 DIAGNOSIS — E119 Type 2 diabetes mellitus without complications: Secondary | ICD-10-CM | POA: Diagnosis not present

## 2023-08-18 DIAGNOSIS — Z7984 Long term (current) use of oral hypoglycemic drugs: Secondary | ICD-10-CM | POA: Diagnosis not present

## 2023-08-18 DIAGNOSIS — I10 Essential (primary) hypertension: Secondary | ICD-10-CM | POA: Diagnosis not present

## 2023-08-18 LAB — POCT GLYCOSYLATED HEMOGLOBIN (HGB A1C): Hemoglobin A1C: 6.5 % — AB (ref 4.0–5.6)

## 2023-08-18 NOTE — Progress Notes (Signed)
Subjective:    Patient ID: Ann Thompson, female    DOB: [birth year], 82 y.o.   MRN: [id number]  HPI  82 year old female who  has a past medical history of Arthritis, DM type 2 (diabetes mellitus, type 2) (HCC), GERD (gastroesophageal reflux disease), History of kidney stones, Hyperlipemia, Hypertension, Left leg pain, Vision changes, and Wears hearing aid in both ears.  Hypertension-currently prescribed Ziac 2.5-6.25 mg daily and Dyazide 37.5-25 mg daily.  She denies dizziness, lightheadedness, chest pain, or shortness of breath. She took her blood pressure just prior to arrival.  BP Readings from Last 3 Encounters:  08/18/23 122/86  07/15/23 120/80  05/22/23 (!) 142/70   Diabetes mellitus type 2-takes Januvia 50 mg daily and glipizide 2.5 mg daily.   She does not check her blood sugars at home.  Denies episodes of hypoglycemia Lab Results  Component Value Date   HGBA1C 6.5 (A) 08/18/2023   HGBA1C 6.8 (A) 05/22/2023   HGBA1C 7.7 (H) 12/03/2022   Wt Readings from Last 3 Encounters:  08/18/23 179 lb (81.2 kg)  07/15/23 178 lb (80.7 kg)  05/22/23 177 lb (80.3 kg)     Review of Systems See HPI   Past Medical History:  Diagnosis Date   Arthritis    DM type 2 (diabetes mellitus, type 2) (HCC)    GERD (gastroesophageal reflux disease)    History of kidney stones    Hyperlipemia    Hypertension    Left leg pain    Vision changes    Wears hearing aid in both ears     Social History   Socioeconomic History   Marital status: Married    Spouse name: Not on file   Number of children: 2   Years of education: Not on file   Highest education level: GED or equivalent  Occupational History   Occupation: retired  Tobacco Use   Smoking status: Former    Current packs/day: 0.00    Average packs/day: 1 pack/day for 25.0 years (25.0 ttl pk-yrs)    Types: Cigarettes    Start date: 09/08/1956    Quit date: 09/08/1981    Years since quitting: 41.9   Smokeless tobacco: Never  Vaping  Use   Vaping status: Never Used  Substance and Sexual Activity   Alcohol use: Yes    Alcohol/week: 4.0 standard drinks of alcohol    Types: 4 Glasses of wine per week   Drug use: Never   Sexual activity: Not on file  Other Topics Concern   Not on file  Social History Narrative   Not on file   Social Determinants of Health   Financial Resource Strain: Low Risk  (04/14/2023)   Overall Financial Resource Strain (CARDIA)    Difficulty of Paying Living Expenses: Not hard at all  Food Insecurity: No Food Insecurity (04/14/2023)   Hunger Vital Sign    Worried About Running Out of Food in the Last Year: Never true    Ran Out of Food in the Last Year: Never true  Transportation Needs: No Transportation Needs (04/14/2023)   PRAPARE - Administrator, Civil Service (Medical): No    Lack of Transportation (Non-Medical): No  Physical Activity: Sufficiently Active (04/14/2023)   Exercise Vital Sign    Days of Exercise per Week: 3 days    Minutes of Exercise per Session: 60 min  Stress: No Stress Concern Present (04/14/2023)   [employer] of Occupational Health - Occupational Stress Questionnaire  Feeling of Stress : Not at all  Social Connections: Moderately Isolated (04/14/2023)   Social Connection and Isolation Panel [NHANES]    Frequency of Communication with Friends and Family: More than three times a week    Frequency of Social Gatherings with Friends and Family: More than three times a week    Attends Religious Services: Never    Database administrator or Organizations: No    Attends Banker Meetings: Never    Marital Status: Married  Catering manager Violence: Not At Risk (04/14/2023)   Humiliation, Afraid, Rape, and Kick questionnaire    Fear of Current or Ex-Partner: No    Emotionally Abused: No    Physically Abused: No    Sexually Abused: No    Past Surgical History:  Procedure Laterality Date   ABDOMINAL HYSTERECTOMY  1982   partial   APPENDECTOMY   1957   BACK SURGERY  07/12/2020   lower at [facility]   CYSTOSCOPY W/ URETERAL STENT PLACEMENT Right 10/06/2020   Procedure: CYSTOSCOPY WITH RETROGRADE PYELOGRAM/URETERAL STENT PLACEMENT;  Surgeon: Sebastian Ache, MD;  Location: [facility] OR;  Service: Urology;  Laterality: Right;   CYSTOSCOPY WITH RETROGRADE PYELOGRAM, URETEROSCOPY AND STENT PLACEMENT Right 10/26/2020   Procedure: CYSTOSCOPY WITH RETROGRADE PYELOGRAM, URETEROSCOPY AND STENT REPLACEMENT;  Surgeon: Sebastian Ache, MD;  Location: [facility];  Service: Urology;  Laterality: Right;  90 MINS   HOLMIUM LASER APPLICATION Right 10/26/2020   Procedure: HOLMIUM LASER APPLICATION;  Surgeon: Sebastian Ache, MD;  Location: [facility];  Service: Urology;  Laterality: Right;   JOINT REPLACEMENT  2016   both knees partial replacement    Family History  Problem Relation Age of Onset   Liver disease Neg Hx    Esophageal cancer Neg Hx    Colon cancer Neg Hx     Allergies  Allergen Reactions   Diclofenac Swelling    1Suspected to have caused hives 2015-- had allergy testing  1Suspected to have caused hives 2015-- had allergy testing                                                    Tongue swelling  1Suspected to have caused hives 2015-- had allergy testing  1Suspected to have caused hives 2015-- had allergy testing                                                    Tongue swelling   Simvastatin     Mylagia    Metformin Other (See Comments)    Current Outpatient Medications on File Prior to Visit  Medication Sig Dispense Refill   acetaminophen (TYLENOL) 325 MG tablet      bisoprolol-hydrochlorothiazide (ZIAC) 2.5-6.25 MG tablet TAKE 1 TABLET BY MOUTH EVERY DAY 90 tablet 1   celecoxib (CELEBREX) 200 MG capsule TAKE 1 CAPSULE BY MOUTH EVERY DAY 90 capsule 1   cycloSPORINE (RESTASIS) 0.05 % ophthalmic emulsion      FLAREX 0.1 % ophthalmic suspension Apply to eye.     fluticasone (FLONASE) 50  MCG/ACT nasal spray USE 2 SPRAYS IN EACH NOSTRIL EVERY DAY 16 g 5   glipiZIDE (GLUCOTROL XL) 2.5  MG 24 hr tablet TAKE 1 TABLET BY MOUTH EVERY DAY WITH BREAKFAST 90 tablet 1   Glucosamine-Chondroit-Vit C-Mn (GLUCOSAMINE 1500 COMPLEX) CAPS Take 1 capsule by mouth daily.     JANUVIA 50 MG tablet TAKE 1 TABLET BY MOUTH EVERY DAY 90 tablet 1   loratadine (CLARITIN) 10 MG tablet Take 1 tablet (10 mg total) by mouth daily. 90 tablet 3   meclizine (ANTIVERT) 25 MG tablet Take 1 tablet (25 mg total) by mouth 3 (three) times daily as needed for dizziness. 30 tablet 0   Multiple Vitamins-Minerals (PRESERVISION AREDS PO) Take 1 tablet by mouth daily.     Polyvinyl Alcohol-Povidone PF (REFRESH) 1.4-0.6 % SOLN      Potassium 75 MG TABS potassium     pramipexole (MIRAPEX) 1 MG tablet TAKE 2 TABLETS BY MOUTH AT BEDTIME 180 tablet 1   sertraline (ZOLOFT) 50 MG tablet Take 1 tablet (50 mg total) by mouth daily. 90 tablet 1   Tetrahydrozoline-Zn Sulfate (EYE DROPS A/C OP) Apply 1 drop to eye daily as needed (dry eyes).     triamcinolone cream (KENALOG) 0.1 % 1 application     triamterene-hydrochlorothiazide (MAXZIDE-25) 37.5-25 MG tablet TAKE 1 TABLET BY MOUTH EVERY DAY 90 tablet 3   UNABLE TO FIND Med Name: cbd oil to knees prn     vitamin B-12 (CYANOCOBALAMIN) 100 MCG tablet Vitamin B12     No current facility-administered medications on file prior to visit.    BP 122/86   Pulse (!) 56   Temp 97.7 F (36.5 C) (Oral)   Ht 5' (1.524 m)   Wt 179 lb (81.2 kg)   SpO2 97%   BMI 34.96 kg/m       Objective:   Physical Exam Vitals and nursing note reviewed.  Constitutional:      Appearance: Normal appearance.  Cardiovascular:     Rate and Rhythm: Normal rate and regular rhythm.     Pulses: Normal pulses.     Heart sounds: Normal heart sounds.  Pulmonary:     Effort: Pulmonary effort is normal.     Breath sounds: Normal breath sounds.  Musculoskeletal:        General: Normal range of motion.   Skin:    General: Skin is warm and dry.  Neurological:     General: No focal deficit present.     Mental Status: She is oriented to person, place, and time.  Psychiatric:        Mood and Affect: Mood normal.        Behavior: Behavior normal.        Thought Content: Thought content normal.        Judgment: Judgment normal.        Assessment & Plan:  1. Diabetes mellitus treated with oral medication (HCC)  - POC HgB A1c- 6.5 - has improved and at goal  - No change in therapy  - Follow up in March for CPE   2. Primary hypertension - Well controlled. No change in medication   [name], NP

## 2023-08-18 NOTE — Patient Instructions (Addendum)
Your A1c has dropped to 6.5 and your blood pressure is perfect today   Please follow up after March 26th 2025 for your annual exam

## 2023-08-21 ENCOUNTER — Ambulatory Visit: Payer: Medicare Other | Admitting: Adult Health

## 2023-09-04 ENCOUNTER — Other Ambulatory Visit: Payer: Self-pay | Admitting: Adult Health

## 2023-09-04 DIAGNOSIS — L299 Pruritus, unspecified: Secondary | ICD-10-CM

## 2023-09-08 ENCOUNTER — Other Ambulatory Visit: Payer: Self-pay | Admitting: Adult Health

## 2023-09-11 ENCOUNTER — Ambulatory Visit: Payer: Self-pay | Admitting: Adult Health

## 2023-09-11 NOTE — Telephone Encounter (Signed)
 Reason for Disposition  [1] Earwax problem AND [2] not willing to try Care Advice  Answer Assessment - Initial Assessment Questions 1. LOCATION: Which ear is involved?      Both ears but left is worse 2. SYMPTOMS: What are the main symptoms? (e.g., fullness, decreased hearing, itching, discomfort)     No  3. ONSET: When did it  start?     A while ago 4. PAIN: Is there any earache? How bad is it?  (Scale 1-10; or mild, moderate, severe)     No pain  Protocols used: Earwax-A-AH   Chief Complaint: Ear wax  Symptoms: Ear wax build up in both ears, difficulty hearing. L ear is worse Frequency: Ongoing for a while Pertinent Negatives: Patient denies Ear pain Disposition: [] ED /[x] Urgent Care (no appt availability in office) / [] Appointment(In office/virtual)/ []  [facility]/ [] Home Care/ [] Refused Recommended Disposition /[] [facility]/ []  Follow-up with PCP Additional Notes: Patient reporting ear wax build up that is affecting her hearing. She does not feel comfortable trying to remove it herself. Pt wants to be seen today. No appts available until Monday. Advised patient that she can go to urgent care. Patient stated she will go.

## 2023-09-11 NOTE — Telephone Encounter (Signed)
 Copied from CRM [id number]. Topic: Clinical - Pink Word Triage >> Sep 11, 2023  8:06 AM Melissa C wrote: Reason for Triage: patient has a gob of wax stuck in her ear causing problems that needs removed. No appointments available in her clinic until Tuesday, no appointments available at the ENT. Would like a call from the nurse  Spoke to Patient. Patient will be going to urgent care today.

## 2023-10-06 ENCOUNTER — Ambulatory Visit: Payer: Self-pay | Admitting: Adult Health

## 2023-10-06 NOTE — Telephone Encounter (Signed)
Pt has been scheduled.

## 2023-10-06 NOTE — Telephone Encounter (Signed)
Chief Complaint: cough Symptoms: cough for over 2 wks Frequency: cough for over 2 wks Pertinent Negatives: Patient denies CP, SOB, fever, N/V Disposition: [] ED /[] Urgent Care (no appt availability in office) / [x] Appointment(In office/virtual)/ []  [facility]/ [] Home Care/ [] Refused Recommended Disposition /[] [facility]/ []  Follow-up with PCP Additional Notes: Daughter Boyd Kerbs called on behalf of pt. Daughter states pt has had a dry, "nagging" cough for over 2 wks. Daughter reports pt returned from a cruise on 1/25. States the cough started before then and is not getting better. Daughter denies fever, CP, SOB, N/V. Daughter concerned about walking pneumonia. Daughter reports intermittent coughing fits. Per protocol, pt scheduled in office tomorrow at 1130. Daughter agreeable to plan. RN advised daughter call back or call EMS for any worsening symptoms. Daughter verbalized understanding.   Copied from CRM [id number]. Topic: Clinical - Red Word Triage >> Oct 06, 2023  4:30 PM Alvino Blood C wrote: Red Word that prompted transfer to Nurse Triage: Patient has a cold and cough. Patient is also experiencing fatigue. Reason for Disposition  SEVERE coughing spells (e.g., whooping sound after coughing, vomiting after coughing)  Answer Assessment - Initial Assessment Questions 1. ONSET: "When did the cough begin?"      2+ weeks of dry cough. Nagging cough. Returned from cruise on 1/25. 2. SEVERITY: "How bad is the cough today?"      Moderate cough, per daughter Boyd Kerbs. Coughing fits intermittently. 3. SPUTUM: "Describe the color of your sputum" (none, dry cough; clear, white, yellow, green)     No 4. HEMOPTYSIS: "Are you coughing up any blood?" If so ask: "How much?" (flecks, streaks, tablespoons, etc.)     No 5. DIFFICULTY BREATHING: "Are you having difficulty breathing?" If Yes, ask: "How bad is it?" (e.g., mild, moderate, severe)    - MILD: No SOB at rest, mild SOB with walking, speaks  normally in sentences, can lie down, no retractions, pulse < 100.    - MODERATE: SOB at rest, SOB with minimal exertion and prefers to sit, cannot lie down flat, speaks in phrases, mild retractions, audible wheezing, pulse 100-120.    - SEVERE: Very SOB at rest, speaks in single words, struggling to breathe, sitting hunched forward, retractions, pulse > 120      "Doesn't think so" - no SOB 6. FEVER: "Do you have a fever?" If Yes, ask: "What is your temperature, how was it measured, and when did it start?"     No fever 7. CARDIAC HISTORY: "Do you have any history of heart disease?" (e.g., heart attack, congestive heart failure)      No 8. LUNG HISTORY: "Do you have any history of lung disease?"  (e.g., pulmonary embolus, asthma, emphysema)     No 9. PE RISK FACTORS: "Do you have a history of blood clots?" (or: recent major surgery, recent prolonged travel, bedridden)     No - but did just come back from a cruise. Flew from [place] to [place]. 10. OTHER SYMPTOMS: "Do you have any other symptoms?" (e.g., runny nose, wheezing, chest pain)       No 12. TRAVEL: "Have you traveled out of the country in the last month?" (e.g., travel history, exposures)       Cruise - yes, but patient never left the ship.  Protocols used: Cough - Acute Non-Productive-A-AH

## 2023-10-07 ENCOUNTER — Ambulatory Visit (INDEPENDENT_AMBULATORY_CARE_PROVIDER_SITE_OTHER): Payer: Medicare Other | Admitting: Adult Health

## 2023-10-07 ENCOUNTER — Encounter: Payer: Self-pay | Admitting: Adult Health

## 2023-10-07 VITALS — BP 130/80 | HR 71 | Temp 97.5°F | Ht 60.0 in | Wt 178.0 lb

## 2023-10-07 DIAGNOSIS — J40 Bronchitis, not specified as acute or chronic: Secondary | ICD-10-CM

## 2023-10-07 MED ORDER — PREDNISONE 10 MG PO TABS: 10.0000 mg | ORAL_TABLET | Freq: Every day | ORAL | 0 refills | Status: DC

## 2023-10-07 MED ORDER — BENZONATATE 200 MG PO CAPS: 200.0000 mg | ORAL_CAPSULE | Freq: Three times a day (TID) | ORAL | 0 refills | Status: DC | PRN

## 2023-10-07 NOTE — Patient Instructions (Signed)
It was great seeing you today   You seem to have bronchitis. This is a viral infection. I have sent in some prednisone and tessalon pearls. Prednisone will raise your blood sugars to please increase water to help keep blood sugars low

## 2023-10-07 NOTE — Progress Notes (Signed)
Subjective:    Patient ID: Ann Thompson, female    DOB: 1940-10-15, 83 y.o.   MRN: [id number]  HPI 83 year old female who  has a past medical history of Arthritis, DM type 2 (diabetes mellitus, type 2) (HCC), GERD (gastroesophageal reflux disease), History of kidney stones, Hyperlipemia, Hypertension, Left leg pain, Vision changes, and Wears hearing aid in both ears.  She presents to the office today for an acute issue.  She reports over the last 2 to 3 weeks she has had a dry constant cough with wheezing.  She has not felt short of breath, had fevers, chills or chest pain.  She has not been using anything over-the-counter to help with the cough.   Review of Systems See HPI   Past Medical History:  Diagnosis Date   Arthritis    DM type 2 (diabetes mellitus, type 2) (HCC)    GERD (gastroesophageal reflux disease)    History of kidney stones    Hyperlipemia    Hypertension    Left leg pain    Vision changes    Wears hearing aid in both ears     Social History   Socioeconomic History   Marital status: Married    Spouse name: Not on file   Number of children: 2   Years of education: Not on file   Highest education level: GED or equivalent  Occupational History   Occupation: retired  Tobacco Use   Smoking status: Former    Current packs/day: 0.00    Average packs/day: 1 pack/day for 25.0 years (25.0 ttl pk-yrs)    Types: Cigarettes    Start date: 09/08/1956    Quit date: 09/08/1981    Years since quitting: 42.1   Smokeless tobacco: Never  Vaping Use   Vaping status: Never Used  Substance and Sexual Activity   Alcohol use: Yes    Alcohol/week: 4.0 standard drinks of alcohol    Types: 4 Glasses of wine per week   Drug use: Never   Sexual activity: Not on file  Other Topics Concern   Not on file  Social History Narrative   Not on file   Social Drivers of Health   Financial Resource Strain: Low Risk  (04/14/2023)   Overall Financial Resource Strain (CARDIA)     Difficulty of Paying Living Expenses: Not hard at all  Food Insecurity: No Food Insecurity (04/14/2023)   Hunger Vital Sign    Worried About Running Out of Food in the Last Year: Never true    Ran Out of Food in the Last Year: Never true  Transportation Needs: No Transportation Needs (04/14/2023)   PRAPARE - Administrator, Civil Service (Medical): No    Lack of Transportation (Non-Medical): No  Physical Activity: Sufficiently Active (04/14/2023)   Exercise Vital Sign    Days of Exercise per Week: 3 days    Minutes of Exercise per Session: 60 min  Stress: No Stress Concern Present (04/14/2023)   [employer] of Occupational Health - Occupational Stress Questionnaire    Feeling of Stress : Not at all  Social Connections: Moderately Isolated (04/14/2023)   Social Connection and Isolation Panel [NHANES]    Frequency of Communication with Friends and Family: More than three times a week    Frequency of Social Gatherings with Friends and Family: More than three times a week    Attends Religious Services: Never    Database administrator or Organizations: No    Attends  Club or Organization Meetings: Never    Marital Status: Married  Catering manager Violence: Not At Risk (04/14/2023)   Humiliation, Afraid, Rape, and Kick questionnaire    Fear of Current or Ex-Partner: No    Emotionally Abused: No    Physically Abused: No    Sexually Abused: No    Past Surgical History:  Procedure Laterality Date   ABDOMINAL HYSTERECTOMY  1982   partial   APPENDECTOMY  1957   BACK SURGERY  07/12/2020   lower at [facility]   CYSTOSCOPY W/ URETERAL STENT PLACEMENT Right 10/06/2020   Procedure: CYSTOSCOPY WITH RETROGRADE PYELOGRAM/URETERAL STENT PLACEMENT;  Surgeon: Sebastian Ache, MD;  Location: [facility] OR;  Service: Urology;  Laterality: Right;   CYSTOSCOPY WITH RETROGRADE PYELOGRAM, URETEROSCOPY AND STENT PLACEMENT Right 10/26/2020   Procedure: CYSTOSCOPY WITH RETROGRADE PYELOGRAM,  URETEROSCOPY AND STENT REPLACEMENT;  Surgeon: Sebastian Ache, MD;  Location: [facility];  Service: Urology;  Laterality: Right;  90 MINS   HOLMIUM LASER APPLICATION Right 10/26/2020   Procedure: HOLMIUM LASER APPLICATION;  Surgeon: Sebastian Ache, MD;  Location[facility];  Service: Urology;  Laterality: Right;   JOINT REPLACEMENT  2016   both knees partial replacement    Family History  Problem Relation Age of Onset   Liver disease Neg Hx    Esophageal cancer Neg Hx    Colon cancer Neg Hx     Allergies  Allergen Reactions   Diclofenac Swelling    1Suspected to have caused hives 2015-- had allergy testing  1Suspected to have caused hives 2015-- had allergy testing                                                    Tongue swelling  1Suspected to have caused hives 2015-- had allergy testing  1Suspected to have caused hives 2015-- had allergy testing                                                    Tongue swelling   Simvastatin     Mylagia    Metformin Other (See Comments)    Current Outpatient Medications on File Prior to Visit  Medication Sig Dispense Refill   acetaminophen (TYLENOL) 325 MG tablet      bisoprolol-hydrochlorothiazide (ZIAC) 2.5-6.25 MG tablet TAKE 1 TABLET BY MOUTH ONCE DAILY 90 tablet 1   celecoxib (CELEBREX) 200 MG capsule TAKE 1 CAPSULE BY MOUTH ONCE DAILY 90 capsule 1   cycloSPORINE (RESTASIS) 0.05 % ophthalmic emulsion      FLAREX 0.1 % ophthalmic suspension Apply to eye.     fluticasone (FLONASE) 50 MCG/ACT nasal spray USE 2 SPRAYS IN EACH NOSTRIL EVERY DAY 16 g 5   glipiZIDE (GLUCOTROL XL) 2.5 MG 24 hr tablet TAKE 1 TABLET BY MOUTH EVERY DAY WITH BREAKFAST 90 tablet 1   Glucosamine-Chondroit-Vit C-Mn (GLUCOSAMINE 1500 COMPLEX) CAPS Take 1 capsule by mouth daily.     JANUVIA 50 MG tablet TAKE 1 TABLET BY MOUTH EVERY DAY 90 tablet 1   loratadine (CLARITIN) 10 MG tablet Take 1 tablet (10 mg total) by mouth daily. 90  tablet 3   meclizine (ANTIVERT) 25 MG tablet Take  1 tablet (25 mg total) by mouth 3 (three) times daily as needed for dizziness. 30 tablet 0   Multiple Vitamins-Minerals (PRESERVISION AREDS PO) Take 1 tablet by mouth daily.     Polyvinyl Alcohol-Povidone PF (REFRESH) 1.4-0.6 % SOLN      Potassium 75 MG TABS potassium     pramipexole (MIRAPEX) 1 MG tablet TAKE 2 TABLETS BY MOUTH AT BEDTIME 180 tablet 1   sertraline (ZOLOFT) 50 MG tablet TAKE 1 TABLET BY MOUTH EVERY DAY 90 tablet 1   Tetrahydrozoline-Zn Sulfate (EYE DROPS A/C OP) Apply 1 drop to eye daily as needed (dry eyes).     triamcinolone cream (KENALOG) 0.1 % 1 application     triamterene-hydrochlorothiazide (MAXZIDE-25) 37.5-25 MG tablet TAKE 1 TABLET BY MOUTH EVERY DAY 90 tablet 3   UNABLE TO FIND Med Name: cbd oil to knees prn     vitamin B-12 (CYANOCOBALAMIN) 100 MCG tablet Vitamin B12     No current facility-administered medications on file prior to visit.    BP 130/80   Pulse 71   Temp (!) 97.5 F (36.4 C) (Oral)   Ht 5' (1.524 m)   Wt 178 lb (80.7 kg)   SpO2 92%   BMI 34.76 kg/m       Objective:   Physical Exam Vitals and nursing note reviewed.  Constitutional:      Appearance: Normal appearance.  Cardiovascular:     Rate and Rhythm: Normal rate and regular rhythm.     Pulses: Normal pulses.     Heart sounds: Normal heart sounds.  Pulmonary:     Effort: Pulmonary effort is normal.     Breath sounds: No stridor. Wheezing (exp wheezing throughout) present. No rhonchi or rales.  Musculoskeletal:        General: Normal range of motion.  Skin:    General: Skin is warm and dry.  Neurological:     General: No focal deficit present.     Mental Status: She is alert and oriented to person, place, and time.  Psychiatric:        Mood and Affect: Mood normal.        Behavior: Behavior normal.        Thought Content: Thought content normal.        Judgment: Judgment normal.       Assessment & Plan:  1. Bronchitis  (Primary) -Likely bronchitis.  Will treat with a short course of prednisone and Tessalon Perles.  She is diabetic with a well-controlled A1c.  Advised to drink more water throughout the day while taking prednisone to help keep blood sugars low.  Follow-up if symptoms not resolving by the end of prednisone therapy.  - benzonatate (TESSALON) 200 MG capsule; Take 1 capsule (200 mg total) by mouth 3 (three) times daily as needed.  Dispense: 30 capsule; Refill: 0 - predniSONE (DELTASONE) 10 MG tablet; Take 1 tablet (10 mg total) by mouth daily with breakfast.  Dispense: 7 tablet; Refill: 0

## 2023-10-30 ENCOUNTER — Ambulatory Visit: Payer: Medicare Other | Admitting: Family Medicine

## 2023-11-23 ENCOUNTER — Other Ambulatory Visit: Payer: Self-pay | Admitting: Adult Health

## 2023-11-24 ENCOUNTER — Other Ambulatory Visit: Payer: Self-pay | Admitting: Adult Health

## 2023-11-24 NOTE — Telephone Encounter (Unsigned)
 Copied from CRM [id number]. Topic: Clinical - Medication Refill >> Nov 24, 2023 11:27 AM Ernst Spell wrote: Most Recent Primary Care Visit:  Provider: [name]  Department: [facility]  Visit Type: ACUTE  Date: 10/07/2023  Medication: calebrex, ziac, zoloft, & maxzide  Has the patient contacted their pharmacy? Yes Pharmacist called and stated she has no more refills. They also attempted to reach out to the office.  Is this the correct pharmacy for this prescription? Yes If no, delete pharmacy and type the correct one.  This is the patient's preferred pharmacy:  [facility] - [place], [place] - [address] [place] [address] Phone: [phone] Fax: [phone]   Has the prescription been filled recently? Yes  Is the patient out of the medication? Yes  Has the patient been seen for an appointment in the last year OR does the patient have an upcoming appointment? Yes  Can we respond through MyChart? No  Agent: Please be advised that Rx refills may take up to 3 business days. We ask that you follow-up with your pharmacy.

## 2023-12-01 ENCOUNTER — Ambulatory Visit (INDEPENDENT_AMBULATORY_CARE_PROVIDER_SITE_OTHER): Admitting: Adult Health

## 2023-12-01 VITALS — BP 130/80 | HR 62 | Temp 99.0°F | Ht 60.0 in

## 2023-12-01 DIAGNOSIS — M1990 Unspecified osteoarthritis, unspecified site: Secondary | ICD-10-CM

## 2023-12-01 MED ORDER — PREDNISONE 20 MG PO TABS: 20.0000 mg | ORAL_TABLET | Freq: Every day | ORAL | 0 refills | Status: DC

## 2023-12-01 NOTE — Progress Notes (Signed)
 Subjective:    Patient ID: Ann Thompson, female    DOB: [birth year], 83 y.o.   MRN: [id number]  HPI  83 year old female who  has a past medical history of Arthritis, DM type 2 (diabetes mellitus, type 2) (HCC), GERD (gastroesophageal reflux disease), History of kidney stones, Hyperlipemia, Hypertension, Left leg pain, Vision changes, and Wears hearing aid in both ears.  She presents to the office today for follow up regarding osteoarthritis. She has pain in multiple joints especially her knees. . She has taken prednisone in the past and had good results with it. She has her grandsons married this weekend and is wondering if she can have something to help with her joint pain so that she can enjoy his wedding.   Review of Systems See HPI   Past Medical History:  Diagnosis Date   Arthritis    DM type 2 (diabetes mellitus, type 2) (HCC)    GERD (gastroesophageal reflux disease)    History of kidney stones    Hyperlipemia    Hypertension    Left leg pain    Vision changes    Wears hearing aid in both ears     Social History   Socioeconomic History   Marital status: Married    Spouse name: Not on file   Number of children: 2   Years of education: Not on file   Highest education level: GED or equivalent  Occupational History   Occupation: retired  Tobacco Use   Smoking status: Former    Current packs/day: 0.00    Average packs/day: 1 pack/day for 25.0 years (25.0 ttl pk-yrs)    Types: Cigarettes    Start date: 09/08/1956    Quit date: 09/08/1981    Years since quitting: 42.2   Smokeless tobacco: Never  Vaping Use   Vaping status: Never Used  Substance and Sexual Activity   Alcohol use: Yes    Alcohol/week: 4.0 standard drinks of alcohol    Types: 4 Glasses of wine per week   Drug use: Never   Sexual activity: Not on file  Other Topics Concern   Not on file  Social History Narrative   Not on file   Social Drivers of Health   Financial Resource Strain: Low Risk   (04/14/2023)   Overall Financial Resource Strain (CARDIA)    Difficulty of Paying Living Expenses: Not hard at all  Food Insecurity: No Food Insecurity (04/14/2023)   Hunger Vital Sign    Worried About Running Out of Food in the Last Year: Never true    Ran Out of Food in the Last Year: Never true  Transportation Needs: No Transportation Needs (04/14/2023)   PRAPARE - Administrator, Civil Service (Medical): No    Lack of Transportation (Non-Medical): No  Physical Activity: Sufficiently Active (04/14/2023)   Exercise Vital Sign    Days of Exercise per Week: 3 days    Minutes of Exercise per Session: 60 min  Stress: No Stress Concern Present (04/14/2023)   [employer] of Occupational Health - Occupational Stress Questionnaire    Feeling of Stress : Not at all  Social Connections: Moderately Isolated (04/14/2023)   Social Connection and Isolation Panel [NHANES]    Frequency of Communication with Friends and Family: More than three times a week    Frequency of Social Gatherings with Friends and Family: More than three times a week    Attends Religious Services: Never    Production manager of [employer]  or Organizations: No    Attends Banker Meetings: Never    Marital Status: Married  Catering manager Violence: Not At Risk (04/14/2023)   Humiliation, Afraid, Rape, and Kick questionnaire    Fear of Current or Ex-Partner: No    Emotionally Abused: No    Physically Abused: No    Sexually Abused: No    Past Surgical History:  Procedure Laterality Date   ABDOMINAL HYSTERECTOMY  1982   partial   APPENDECTOMY  1957   BACK SURGERY  07/12/2020   lower [facility]   CYSTOSCOPY W/ URETERAL STENT PLACEMENT Right 10/06/2020   Procedure: CYSTOSCOPY WITH RETROGRADE PYELOGRAM/URETERAL STENT PLACEMENT;  Surgeon: Sebastian Ache, MD;  Location: [facility] OR;  Service: Urology;  Laterality: Right;   CYSTOSCOPY WITH RETROGRADE PYELOGRAM, URETEROSCOPY AND STENT PLACEMENT Right 10/26/2020    Procedure: CYSTOSCOPY WITH RETROGRADE PYELOGRAM, URETEROSCOPY AND STENT REPLACEMENT;  Surgeon: Sebastian Ache, MD;  Location: [facility];  Service: Urology;  Laterality: Right;  90 MINS   HOLMIUM LASER APPLICATION Right 10/26/2020   Procedure: HOLMIUM LASER APPLICATION;  Surgeon: Sebastian Ache, MD;  Location: [facility];  Service: Urology;  Laterality: Right;   JOINT REPLACEMENT  2016   both knees partial replacement    Family History  Problem Relation Age of Onset   Liver disease Neg Hx    Esophageal cancer Neg Hx    Colon cancer Neg Hx     Allergies  Allergen Reactions   Diclofenac Swelling    1Suspected to have caused hives 2015-- had allergy testing  1Suspected to have caused hives 2015-- had allergy testing                                                    Tongue swelling  1Suspected to have caused hives 2015-- had allergy testing  1Suspected to have caused hives 2015-- had allergy testing                                                    Tongue swelling   Simvastatin     Mylagia    Metformin Other (See Comments)    Current Outpatient Medications on File Prior to Visit  Medication Sig Dispense Refill   acetaminophen (TYLENOL) 325 MG tablet      benzonatate (TESSALON) 200 MG capsule Take 1 capsule (200 mg total) by mouth 3 (three) times daily as needed. 30 capsule 0   bisoprolol-hydrochlorothiazide (ZIAC) 2.5-6.25 MG tablet TAKE 1 TABLET BY MOUTH ONCE DAILY 90 tablet 3   celecoxib (CELEBREX) 200 MG capsule TAKE 1 CAPSULE BY MOUTH ONCE DAILY 90 capsule 3   cycloSPORINE (RESTASIS) 0.05 % ophthalmic emulsion      FLAREX 0.1 % ophthalmic suspension Apply to eye.     fluticasone (FLONASE) 50 MCG/ACT nasal spray USE 2 SPRAYS IN EACH NOSTRIL EVERY DAY 16 g 5   glipiZIDE (GLUCOTROL XL) 2.5 MG 24 hr tablet TAKE 1 TABLET BY MOUTH EVERY DAY WITH BREAKFAST 90 tablet 1   Glucosamine-Chondroit-Vit C-Mn (GLUCOSAMINE 1500 COMPLEX) CAPS Take 1  capsule by mouth daily.     JANUVIA 50 MG tablet TAKE 1 TABLET BY MOUTH EVERY  DAY 90 tablet 1   loratadine (CLARITIN) 10 MG tablet Take 1 tablet (10 mg total) by mouth daily. 90 tablet 3   meclizine (ANTIVERT) 25 MG tablet Take 1 tablet (25 mg total) by mouth 3 (three) times daily as needed for dizziness. 30 tablet 0   Multiple Vitamins-Minerals (PRESERVISION AREDS PO) Take 1 tablet by mouth daily.     Polyvinyl Alcohol-Povidone PF (REFRESH) 1.4-0.6 % SOLN      Potassium 75 MG TABS potassium     pramipexole (MIRAPEX) 1 MG tablet TAKE 2 TABLETS BY MOUTH AT BEDTIME 180 tablet 1   predniSONE (DELTASONE) 10 MG tablet Take 1 tablet (10 mg total) by mouth daily with breakfast. 7 tablet 0   sertraline (ZOLOFT) 50 MG tablet TAKE 1 TABLET BY MOUTH EVERY DAY 90 tablet 1   Tetrahydrozoline-Zn Sulfate (EYE DROPS A/C OP) Apply 1 drop to eye daily as needed (dry eyes).     triamcinolone cream (KENALOG) 0.1 % 1 application     triamterene-hydrochlorothiazide (MAXZIDE-25) 37.5-25 MG tablet TAKE 1 TABLET BY MOUTH EVERY DAY 90 tablet 3   UNABLE TO FIND Med Name: cbd oil to knees prn     vitamin B-12 (CYANOCOBALAMIN) 100 MCG tablet Vitamin B12     No current facility-administered medications on file prior to visit.    BP 130/80   Pulse 62   Temp 99 F (37.2 C) (Oral)   Ht 5' (1.524 m) Comment: pt declined  SpO2 96%   BMI 34.76 kg/m       Objective:   Physical Exam Vitals and nursing note reviewed.  Constitutional:      Appearance: Normal appearance.  Cardiovascular:     Rate and Rhythm: Normal rate and regular rhythm.     Pulses: Normal pulses.     Heart sounds: Normal heart sounds.  Musculoskeletal:        General: Tenderness present. No swelling.  Skin:    General: Skin is warm and dry.  Neurological:     General: No focal deficit present.     Mental Status: She is alert and oriented to person, place, and time.  Psychiatric:        Mood and Affect: Mood normal.        Behavior:  Behavior normal.        Thought Content: Thought content normal.        Judgment: Judgment normal.         Assessment & Plan:  1. Chronic osteoarthritis (Primary) - Her A1c is controlled. I am ok with sending in Prednisone x 5 days for her.  - predniSONE (DELTASONE) 20 MG tablet; Take 1 tablet (20 mg total) by mouth daily with breakfast.  Dispense: 5 tablet; Refill: 0  [name], NP

## 2023-12-03 ENCOUNTER — Encounter: Payer: Medicare Other | Admitting: Adult Health

## 2023-12-08 ENCOUNTER — Other Ambulatory Visit: Payer: Self-pay | Admitting: Adult Health

## 2023-12-08 DIAGNOSIS — G2581 Restless legs syndrome: Secondary | ICD-10-CM

## 2023-12-08 NOTE — Telephone Encounter (Signed)
 Okay for refill?

## 2024-03-07 ENCOUNTER — Encounter (INDEPENDENT_AMBULATORY_CARE_PROVIDER_SITE_OTHER): Payer: Self-pay

## 2024-03-10 ENCOUNTER — Other Ambulatory Visit: Payer: Self-pay | Admitting: Adult Health

## 2024-03-10 DIAGNOSIS — E119 Type 2 diabetes mellitus without complications: Secondary | ICD-10-CM

## 2024-03-10 NOTE — Telephone Encounter (Signed)
Pt needs an appt. For further refills

## 2024-03-14 ENCOUNTER — Encounter (INDEPENDENT_AMBULATORY_CARE_PROVIDER_SITE_OTHER): Payer: Self-pay | Admitting: Physician Assistant

## 2024-03-14 ENCOUNTER — Ambulatory Visit (INDEPENDENT_AMBULATORY_CARE_PROVIDER_SITE_OTHER): Admitting: Physician Assistant

## 2024-03-14 VITALS — BP 155/67 | HR 79

## 2024-03-14 DIAGNOSIS — H908 Mixed conductive and sensorineural hearing loss, unspecified: Secondary | ICD-10-CM

## 2024-03-14 DIAGNOSIS — H900 Conductive hearing loss, bilateral: Secondary | ICD-10-CM

## 2024-03-14 NOTE — Patient Instructions (Signed)
 I have ordered an imaging study for you to complete prior to your next visit. Please call [facility] Scheduling at [phone] to schedule your imaging if you have not received a call within 24 hours. If you are unable to complete your imaging study prior to your next scheduled visit please call our office to let us know.

## 2024-03-14 NOTE — Progress Notes (Signed)
 Dear Dr. Buren, Here is my assessment for our mutual patient, Ann Thompson. Thank you for allowing me the opportunity to care for your patient. Please do not hesitate to contact me should you have any other questions. Sincerely, Chyrl Cohen PA-C  Otolaryngology Clinic Note Referring provider: Dr. Buren HPI:  Ann Thompson is a 83 y.o. female kindly referred by Dr. Buren   The patient is an 83 year old female seen in our office for evaluation of hearing loss.  She is accompanied by her daughter who helps provide some of her history.  The patient notes approximately 5-year history of progressive hearing loss in both ears.  She notes the left ear has gotten worse when compared to the right.  She denies any significant trauma or surgeries to her ear prior to this no history recurrent ear infections.  She denies any associated ringing, dizziness, or pain.  She denies any history of significant noise trauma. 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    PMH/Meds/All/SocHx/FamHx/ROS:   Past Medical History:  Diagnosis Date   Arthritis    DM type 2 (diabetes mellitus, type 2) (HCC)    GERD (gastroesophageal reflux disease)    History of kidney stones    Hyperlipemia    Hypertension    Left leg pain     Vision changes    Wears hearing aid in both ears      Past Surgical History:  Procedure Laterality Date   ABDOMINAL HYSTERECTOMY  1982   partial   APPENDECTOMY  1957   BACK SURGERY  07/12/2020   lower at [facility]   CYSTOSCOPY W/ URETERAL STENT PLACEMENT Right 10/06/2020   Procedure: CYSTOSCOPY WITH RETROGRADE PYELOGRAM/URETERAL STENT PLACEMENT;  Surgeon: Alvaro Hummer, MD;  Location: [facility] OR;  Service: Urology;  Laterality: Right;   CYSTOSCOPY WITH RETROGRADE PYELOGRAM, URETEROSCOPY AND STENT PLACEMENT Right 10/26/2020   Procedure: CYSTOSCOPY WITH RETROGRADE PYELOGRAM, URETEROSCOPY AND STENT REPLACEMENT;  Surgeon: Alvaro Hummer, MD;  Location: [facility];  Service: Urology;  Laterality: Right;  90 MINS   HOLMIUM LASER APPLICATION Right 10/26/2020   Procedure: HOLMIUM LASER APPLICATION;  Surgeon: Alvaro Hummer, MD;  Location: [facility];  Service: Urology;  Laterality: Right;   JOINT REPLACEMENT  2016   both knees partial replacement    Family History  Problem Relation Age of Onset   Liver disease Neg Hx    Esophageal cancer Neg Hx    Colon cancer Neg Hx      Social Connections: Moderately Isolated (04/14/2023)   Social Connection and Isolation Panel    Frequency of Communication with Friends and Family: More than three times a week    Frequency of Social Gatherings with Friends and Family: More than three times a week    Attends Religious Services: Never  Active Member of Clubs or Organizations: No    Attends Banker Meetings: Never    Marital Status: Married      Current Outpatient Medications:    acetaminophen  (TYLENOL ) 325 MG tablet, , Disp: , Rfl:    benzonatate  (TESSALON ) 200 MG capsule, Take 1 capsule (200 mg total) by mouth 3 (three) times daily as needed., Disp: 30 capsule, Rfl: 0   bisoprolol -hydrochlorothiazide  (ZIAC ) 2.5-6.25 MG tablet, TAKE 1 TABLET BY MOUTH ONCE DAILY, Disp: 90 tablet, Rfl: 3    celecoxib  (CELEBREX ) 200 MG capsule, TAKE 1 CAPSULE BY MOUTH ONCE DAILY, Disp: 90 capsule, Rfl: 3   cycloSPORINE (RESTASIS) 0.05 % ophthalmic emulsion, , Disp: , Rfl:    FLAREX 0.1 % ophthalmic suspension, Apply to eye., Disp: , Rfl:    fluticasone  (FLONASE ) 50 MCG/ACT nasal spray, USE 2 SPRAYS IN EACH NOSTRIL EVERY DAY, Disp: 16 g, Rfl: 5   glipiZIDE  (GLUCOTROL  XL) 2.5 MG 24 hr tablet, TAKE 1 TABLET BY MOUTH EVERY DAY WITH BREAKFAST, Disp: 90 tablet, Rfl: 1   Glucosamine-Chondroit-Vit C-Mn (GLUCOSAMINE 1500 COMPLEX) CAPS, Take 1 capsule by mouth daily., Disp: , Rfl:    JANUVIA  50 MG tablet, TAKE 1 TABLET BY MOUTH EVERY DAY, Disp: 90 tablet, Rfl: 1   loratadine  (CLARITIN ) 10 MG tablet, Take 1 tablet (10 mg total) by mouth daily., Disp: 90 tablet, Rfl: 3   meclizine  (ANTIVERT ) 25 MG tablet, Take 1 tablet (25 mg total) by mouth 3 (three) times daily as needed for dizziness., Disp: 30 tablet, Rfl: 0   Multiple Vitamins-Minerals (PRESERVISION AREDS PO), Take 1 tablet by mouth daily., Disp: , Rfl:    Polyvinyl Alcohol-Povidone PF (REFRESH) 1.4-0.6 % SOLN, , Disp: , Rfl:    Potassium 75 MG TABS, potassium, Disp: , Rfl:    pramipexole  (MIRAPEX ) 1 MG tablet, TAKE 2 TABLETS BY MOUTH AT BEDTIME, Disp: 180 tablet, Rfl: 1   predniSONE  (DELTASONE ) 20 MG tablet, Take 1 tablet (20 mg total) by mouth daily with breakfast., Disp: 5 tablet, Rfl: 0   sertraline  (ZOLOFT ) 50 MG tablet, TAKE 1 TABLET BY MOUTH EVERY DAY, Disp: 90 tablet, Rfl: 1   Tetrahydrozoline-Zn Sulfate (EYE DROPS A/C OP), Apply 1 drop to eye daily as needed (dry eyes)., Disp: , Rfl:    triamcinolone  cream (KENALOG ) 0.1 %, 1 application, Disp: , Rfl:    triamterene-hydrochlorothiazide  (MAXZIDE-25) 37.5-25 MG tablet, TAKE 1 TABLET BY MOUTH EVERY DAY, Disp: 90 tablet, Rfl: 3   UNABLE TO FIND, Med Name: cbd oil to knees prn, Disp: , Rfl:    vitamin B-12 (CYANOCOBALAMIN) 100 MCG tablet, Vitamin B12, Disp: , Rfl:    Physical Exam:   BP (!) 155/67    Pulse 79   SpO2 93%   Pertinent Findings  CN II-XII intact Bilateral EAC clear and TM intact with opaque bilateral TMs unable to visualize any effusion No lesions of oral cavity/oropharynx No obviously neck masses/lymphadenopathy/thyromegaly No respiratory distress or stridor  Seprately Identifiable Procedures:  None  Impression & Plans:  Verta Riedlinger is a 83 y.o. 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  She does have air-bone gaps bilateral, left greater than right.  She has bilateral type B tympanometry that has been persistent for the last several years.  I would recommend CT temporal bone for further evaluation.  Once this is available we will see the patient back in our office for discussion of results and a plan  moving forward.   - f/u 3 weeks after CT temporal bone   Thank you for allowing me the opportunity to care for your patient. Please do not hesitate to contact me should you have any other questions.  Sincerely, Chyrl Cohen PA-C  [facility] Phone: [phone] Fax: [phone]  03/14/2024, 11:44 AM

## 2024-04-04 ENCOUNTER — Ambulatory Visit (HOSPITAL_BASED_OUTPATIENT_CLINIC_OR_DEPARTMENT_OTHER)
Admission: RE | Admit: 2024-04-04 | Discharge: 2024-04-04 | Disposition: A | Discharge status: Home / Self Care | Source: Ambulatory Visit | Attending: Physician Assistant | Admitting: Physician Assistant

## 2024-04-04 DIAGNOSIS — H900 Conductive hearing loss, bilateral: Secondary | ICD-10-CM | POA: Insufficient documentation

## 2024-04-13 ENCOUNTER — Telehealth: Payer: Self-pay | Admitting: *Deleted

## 2024-04-13 NOTE — Telephone Encounter (Signed)
 Copied from CRM [id number]. Topic: Clinical - Medical Advice >> Apr 11, 2024  9:13 AM Carlatta H wrote: Reason for CRM: Please call back Patients daughter Santana at [phone]//She did not want the patient called about this phone call

## 2024-04-13 NOTE — Telephone Encounter (Signed)
 FYI Spoke to pt daughter and she stated that she thought she would get a call from [place]. She also stated she thought that she would get a call Monday not today. I advised that we don't work on Mondays and I could set her up with a time per Cory's availability. She declined and stated she was at the airport she wanted to talk to [place] while she was in town. She stated she will try to talk to [facility] next Tuesday when she returns.

## 2024-04-18 ENCOUNTER — Telehealth: Payer: Self-pay

## 2024-04-18 NOTE — Patient Instructions (Signed)
 Ann Thompson , Thank you for taking time out of your busy schedule to complete your Annual Wellness Visit with me. I enjoyed our conversation and look forward to speaking with you again next year. I, as well as your care team,  appreciate your ongoing commitment to your health goals. Please review the following plan we discussed and let me know if I can assist you in the future. Your Game plan/ To Do List    Referrals: If you haven't heard from the office you've been referred to, please reach out to them at the phone provided.   Follow up Visits: We will see or speak with you next year for your Next Medicare AWV with our clinical staff  Have you seen your provider in the last 6 months (3 months if uncontrolled diabetes)?   Clinician Recommendations:  Aim for 30 minutes of exercise or brisk walking, 6-8 glasses of water, and 5 servings of fruits and vegetables each day.       This is a list of the screenings recommended for you:  Health Maintenance  Topic Date Due   Eye exam for diabetics  Never done   Yearly kidney health urinalysis for diabetes  Never done   DTaP/Tdap/Td vaccine (1 - Tdap) Never done   Pneumococcal Vaccine for age over 72 (1 of 2 - PCV) Never done   Zoster (Shingles) Vaccine (1 of 2) Never done   DEXA scan (bone density measurement)  Never done   Yearly kidney function blood test for diabetes  12/02/2023   Complete foot exam   12/02/2023   COVID-19 Vaccine (5 - 2024-25 season) 12/23/2023   Hemoglobin A1C  02/16/2024   Flu Shot  04/08/2024   Medicare Annual Wellness Visit  04/18/2025   Hepatitis B Vaccine  Aged Out   HPV Vaccine  Aged Out   Meningitis B Vaccine  Aged Out    Advanced directives:  Advance Care Planning is important because it:  [x]  Makes sure you receive the medical care that is consistent with your values, goals, and preferences  [x]  It provides guidance to your family and loved ones and reduces their decisional burden about whether or not they are  making the right decisions based on your wishes.  Follow the link provided in your after visit summary or read over the paperwork we have mailed to you to help you started getting your Advance Directives in place. If you need assistance in completing these, please reach out to us  so that we can help you!  See attachments for Preventive Care and Fall Prevention Tips.

## 2024-04-18 NOTE — Telephone Encounter (Signed)
 Contacted patient on preferred number listed in notes for scheduled AWV. Patient declined to complete visit will call back to reschedule.

## 2024-04-18 NOTE — Progress Notes (Signed)
 Subjective:   Nature Kueker is a 83 y.o. who presents for a Medicare Wellness preventive visit.  As a reminder, Annual Wellness Visits don't include a physical exam, and some assessments may be limited, especially if this visit is performed virtually. We may recommend an in-person follow-up visit with your provider if needed.  Visit Complete:     Persons Participating in Visit:   AWV Questionnaire:         Objective:    Today's Vitals   There is no height or weight on file to calculate BMI.     05/26/2023   12:42 PM 05/26/2023   11:54 AM 04/14/2023    3:24 PM 04/13/2023   11:09 AM 02/04/2022   11:53 AM 02/27/2021    3:19 PM 01/30/2021    2:06 PM  Advanced Directives  Does Patient Have a Medical Advance Directive?  Yes Yes Yes Yes Yes Yes  Type of Advance Directive Living will  Healthcare Power of [place];Living will Healthcare Power of [place];Living will Healthcare Power of [place];Living will Healthcare Power of [place];Living will Healthcare Power of [place];Living will  Does patient want to make changes to medical advance directive?       No - Patient declined  Copy of Healthcare Power of Attorney in Chart?   No - copy requested No - copy requested No - copy requested No - copy requested No - copy requested    Current Medications (verified) Outpatient Encounter Medications as of 04/18/2024  Medication Sig   acetaminophen  (TYLENOL ) 325 MG tablet    benzonatate  (TESSALON ) 200 MG capsule Take 1 capsule (200 mg total) by mouth 3 (three) times daily as needed.   bisoprolol -hydrochlorothiazide  (ZIAC ) 2.5-6.25 MG tablet TAKE 1 TABLET BY MOUTH ONCE DAILY   celecoxib  (CELEBREX ) 200 MG capsule TAKE 1 CAPSULE BY MOUTH ONCE DAILY   cycloSPORINE (RESTASIS) 0.05 % ophthalmic emulsion    FLAREX 0.1 % ophthalmic suspension Apply to eye.   fluticasone  (FLONASE ) 50 MCG/ACT nasal spray USE 2 SPRAYS IN EACH NOSTRIL EVERY DAY   glipiZIDE  (GLUCOTROL  XL) 2.5 MG 24 hr tablet TAKE 1 TABLET  BY MOUTH EVERY DAY WITH BREAKFAST   Glucosamine-Chondroit-Vit C-Mn (GLUCOSAMINE 1500 COMPLEX) CAPS Take 1 capsule by mouth daily.   JANUVIA  50 MG tablet TAKE 1 TABLET BY MOUTH EVERY DAY   loratadine  (CLARITIN ) 10 MG tablet Take 1 tablet (10 mg total) by mouth daily.   meclizine  (ANTIVERT ) 25 MG tablet Take 1 tablet (25 mg total) by mouth 3 (three) times daily as needed for dizziness.   Multiple Vitamins-Minerals (PRESERVISION AREDS PO) Take 1 tablet by mouth daily.   Polyvinyl Alcohol-Povidone PF (REFRESH) 1.4-0.6 % SOLN    Potassium 75 MG TABS potassium   pramipexole  (MIRAPEX ) 1 MG tablet TAKE 2 TABLETS BY MOUTH AT BEDTIME   predniSONE  (DELTASONE ) 20 MG tablet Take 1 tablet (20 mg total) by mouth daily with breakfast.   sertraline  (ZOLOFT ) 50 MG tablet TAKE 1 TABLET BY MOUTH EVERY DAY   Tetrahydrozoline-Zn Sulfate (EYE DROPS A/C OP) Apply 1 drop to eye daily as needed (dry eyes).   triamcinolone  cream (KENALOG ) 0.1 % 1 application   triamterene-hydrochlorothiazide  (MAXZIDE-25) 37.5-25 MG tablet TAKE 1 TABLET BY MOUTH EVERY DAY   UNABLE TO FIND Med Name: cbd oil to knees prn   vitamin B-12 (CYANOCOBALAMIN) 100 MCG tablet Vitamin B12   No facility-administered encounter medications on file as of 04/18/2024.    Allergies (verified) Diclofenac, Simvastatin , and Metformin   History: Past Medical History:  Diagnosis Date   Arthritis    DM type 2 (diabetes mellitus, type 2) (HCC)    GERD (gastroesophageal reflux disease)    History of kidney stones    Hyperlipemia    Hypertension    Left leg pain    Vision changes    Wears hearing aid in both ears    Past Surgical History:  Procedure Laterality Date   ABDOMINAL HYSTERECTOMY  1982   partial   APPENDECTOMY  1957   BACK SURGERY  07/12/2020   [facility]   CYSTOSCOPY W/ URETERAL STENT PLACEMENT Right 10/06/2020   Procedure: CYSTOSCOPY WITH RETROGRADE PYELOGRAM/URETERAL STENT PLACEMENT;  Surgeon: Alvaro Hummer, MD;   Location: [facility] OR;  Service: Urology;  Laterality: Right;   CYSTOSCOPY WITH RETROGRADE PYELOGRAM, URETEROSCOPY AND STENT PLACEMENT Right 10/26/2020   Procedure: CYSTOSCOPY WITH RETROGRADE PYELOGRAM, URETEROSCOPY AND STENT REPLACEMENT;  Surgeon: Alvaro Hummer, MD;  Location: [facility];  Service: Urology;  Laterality: Right;  90 MINS   HOLMIUM LASER APPLICATION Right 10/26/2020   Procedure: HOLMIUM LASER APPLICATION;  Surgeon: Alvaro Hummer, MD;  Location: [facility];  Service: Urology;  Laterality: Right;   JOINT REPLACEMENT  2016   both knees partial replacement   Family History  Problem Relation Age of Onset   Liver disease Neg Hx    Esophageal cancer Neg Hx    Colon cancer Neg Hx    Social History   Socioeconomic History   Marital status: Married    Spouse name: Not on file   Number of children: 2   Years of education: Not on file   Highest education level: GED or equivalent  Occupational History   Occupation: retired  Tobacco Use   Smoking status: Former    Current packs/day: 0.00    Average packs/day: 1 pack/day for 25.0 years (25.0 ttl pk-yrs)    Types: Cigarettes    Start date: 09/08/1956    Quit date: 09/08/1981    Years since quitting: 42.6   Smokeless tobacco: Never  Vaping Use   Vaping status: Never Used  Substance and Sexual Activity   Alcohol use: Yes    Alcohol/week: 4.0 standard drinks of alcohol    Types: 4 Glasses of wine per week   Drug use: Never   Sexual activity: Not on file  Other Topics Concern   Not on file  Social History Narrative   Not on file   Social Drivers of Health   Financial Resource Strain: Low Risk  (04/14/2023)   Overall Financial Resource Strain (CARDIA)    Difficulty of Paying Living Expenses: Not hard at all  Food Insecurity: No Food Insecurity (04/14/2023)   Hunger Vital Sign    Worried About Running Out of Food in the Last Year: Never true    Ran Out of Food in the Last Year: Never true   Transportation Needs: No Transportation Needs (04/14/2023)   PRAPARE - Administrator, Civil Service (Medical): No    Lack of Transportation (Non-Medical): No  Physical Activity: Sufficiently Active (04/14/2023)   Exercise Vital Sign    Days of Exercise per Week: 3 days    Minutes of Exercise per Session: 60 min  Stress: No Stress Concern Present (04/14/2023)   [employer] of Occupational Health - Occupational Stress Questionnaire    Feeling of Stress : Not at all  Social Connections: Moderately Isolated (04/14/2023)   Social Connection and Isolation Panel    Frequency of Communication  with Friends and Family: More than three times a week    Frequency of Social Gatherings with Friends and Family: More than three times a week    Attends Religious Services: Never    Database administrator or Organizations: No    Attends Banker Meetings: Never    Marital Status: Married    Tobacco Counseling Counseling given: Not Answered    Clinical Intake:              Lab Results  Component Value Date   HGBA1C 6.5 (A) 08/18/2023   HGBA1C 6.8 (A) 05/22/2023   HGBA1C 7.7 (H) 12/03/2022               Activities of Daily Living      No data to display          Patient Care Team: Merna Huxley, NP as PCP - General (Family Medicine)  I have updated your Care Teams any recent Medical Services you may have received from other providers in the past year.     Assessment:   This is a routine wellness examination for [place].  Hearing/Vision screen No results found.   Goals Addressed   None    Depression Screen     04/14/2023    3:15 PM 04/13/2023   11:06 AM 02/04/2022   11:54 AM 09/25/2021   10:16 AM 01/30/2021    2:11 PM 09/21/2020    1:09 PM  PHQ 2/9 Scores  PHQ - 2 Score 0 0 0 0 0 0  PHQ- 9 Score    0      Fall Risk     04/14/2023    3:23 PM 04/13/2023   11:08 AM 06/27/2022   11:54 AM 02/04/2022   11:54 AM 09/25/2021   10:16 AM  Fall  Risk   Falls in the past year? 0 0 0 0 0  Number falls in past yr: 0 0  0 0  Injury with Fall? 0 0  0 0  Risk for fall due to : No Fall Risks No Fall Risks  Medication side effect   Follow up Falls prevention discussed Falls evaluation completed;Falls prevention discussed  Falls evaluation completed;Education provided;Falls prevention discussed       Data saved with a previous flowsheet row definition    MEDICARE RISK AT HOME:     TIMED UP AND GO:  Was the test performed?    Cognitive Function:         04/14/2023    3:24 PM 04/13/2023   11:09 AM 02/04/2022   11:56 AM  6CIT Screen  What Year? 0 points 0 points 0 points  What month? 0 points 0 points 0 points  What time? 0 points 0 points 0 points  Count back from 20 0 points 0 points 0 points  Months in reverse 0 points 0 points 0 points  Repeat phrase 0 points 0 points 0 points  Total Score 0 points 0 points 0 points    Immunizations Immunization History  Administered Date(s) Administered    sv, Bivalent, Protein Subunit Rsvpref,pf [place]) 06/24/2023   Fluad Quad(high Dose 65+) 06/22/2020, 06/18/2022   Fluad Trivalent(High Dose 65+) 05/22/2023   Influenza-Unspecified 07/05/2021   PFIZER(Purple Top)SARS-COV-2 Vaccination 10/10/2019, 11/06/2019   Pfizer(Comirnaty)Fall Seasonal Vaccine 12 years and older 06/18/2022, 06/24/2023    Screening Tests Health Maintenance  Topic Date Due   OPHTHALMOLOGY EXAM  Never done   Diabetic kidney evaluation - Urine ACR  Never done  DTaP/Tdap/Td (1 - Tdap) Never done   Pneumococcal Vaccine: 50+ Years (1 of 2 - PCV) Never done   Zoster Vaccines- Shingrix (1 of 2) Never done   DEXA SCAN  Never done   Diabetic kidney evaluation - eGFR measurement  12/02/2023   FOOT EXAM  12/02/2023   COVID-19 Vaccine (5 - 2024-25 season) 12/23/2023   HEMOGLOBIN A1C  02/16/2024   INFLUENZA VACCINE  04/08/2024   Medicare Annual Wellness (AWV)  04/18/2025   Hepatitis B Vaccines  Aged Out   HPV  VACCINES  Aged Out   Meningococcal B Vaccine  Aged Out    Health Maintenance  Health Maintenance Due  Topic Date Due   OPHTHALMOLOGY EXAM  Never done   Diabetic kidney evaluation - Urine ACR  Never done   DTaP/Tdap/Td (1 - Tdap) Never done   Pneumococcal Vaccine: 50+ Years (1 of 2 - PCV) Never done   Zoster Vaccines- Shingrix (1 of 2) Never done   DEXA SCAN  Never done   Diabetic kidney evaluation - eGFR measurement  12/02/2023   FOOT EXAM  12/02/2023   COVID-19 Vaccine (5 - 2024-25 season) 12/23/2023   HEMOGLOBIN A1C  02/16/2024   INFLUENZA VACCINE  04/08/2024   Health Maintenance Items Addressed:   Additional Screening:  Vision Screening: Recommended annual ophthalmology exams for early detection of glaucoma and other disorders of the eye. Would you like a referral to an eye doctor?    Dental Screening: Recommended annual dental exams for proper oral hygiene  Community Resource Referral / Chronic Care Management: CRR required this visit?    CCM required this visit?     Plan:    I have personally reviewed and noted the following in the patient's chart:   Medical and social history Use of alcohol, tobacco or illicit drugs  Current medications and supplements including opioid prescriptions. Functional ability and status Nutritional status Physical activity Advanced directives List of other physicians Hospitalizations, surgeries, and ER visits in previous 12 months Vitals Screenings to include cognitive, depression, and falls Referrals and appointments  In addition, I have reviewed and discussed with patient certain preventive protocols, quality metrics, and best practice recommendations. A written personalized care plan for preventive services as well as general preventive health recommendations were provided to patient.   Rojelio LELON Blush, LPN   1/88/7974   After Visit Summary:   Notes: This encounter was created in error - please disregard. Patient stated  unable to complete visit will call back to reschedule.

## 2024-04-20 ENCOUNTER — Encounter (INDEPENDENT_AMBULATORY_CARE_PROVIDER_SITE_OTHER): Payer: Self-pay | Admitting: Physician Assistant

## 2024-04-20 ENCOUNTER — Ambulatory Visit (INDEPENDENT_AMBULATORY_CARE_PROVIDER_SITE_OTHER): Admitting: Physician Assistant

## 2024-04-20 VITALS — BP 161/59 | HR 77

## 2024-04-20 DIAGNOSIS — H906 Mixed conductive and sensorineural hearing loss, bilateral: Secondary | ICD-10-CM

## 2024-04-20 DIAGNOSIS — H6592 Unspecified nonsuppurative otitis media, left ear: Secondary | ICD-10-CM | POA: Diagnosis not present

## 2024-04-20 NOTE — Progress Notes (Signed)
 Dear Dr. Merna, Here is my assessment for our mutual patient, Ann Thompson. Thank you for allowing me the opportunity to care for your patient. Please do not hesitate to contact me should you have any other questions. Sincerely, Chyrl Cohen PA-C  Otolaryngology Clinic Note Referring provider: Dr. Merna HPI:  Ann Thompson is a 83 y.o. female kindly referred by Dr. Merna   The patient is a 83 year old female seen in our office for follow-up evaluation of decreased hearing.  She is accompanied by her daughter.  Below is a recap of her previous office visit on 03/14/2024.  The patient is an 83 year old female seen in our office for evaluation of hearing loss.  She is accompanied by her daughter who helps provide some of her history.  The patient notes approximately 5-year history of progressive hearing loss in both ears.  She notes the left ear has gotten worse when compared to the right.  She denies any significant trauma or surgeries to her ear prior to this no history recurrent ear infections.  She denies any associated ringing, dizziness, or pain.  She denies any history of significant noise trauma. 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   Update 04/20/2024  The patient's daughter notes that on 04/11/2024 she went to audiology, they did not want to adjust her hearing aids until she followed up in office.  She had completed the CT there was significant for left-sided middle ear opacification.  She notes continued difficulty hearing out of both ears although the left is significantly worse than the right.  She denies any pain, no infections.  To recap she originally had audiogram showing bilateral type B  tympanometry in 2023.  At that time she was started on Flonase  she used this for several months with no significant improvement in her symptoms.  She did have follow-up audiogram with persistent type B tympanometry.  She denies any history of seasonal allergies.  She does report a history of postnasal drainage.   Independent Review of Additional Tests or Records:  CT temporal bone on 04/08/2024-  IMPRESSION: 1. No abnormality identified on the right side 2. Complete opacification of the left middle ear cavity without evidence of cholesteatoma.   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     PMH/Meds/All/SocHx/FamHx/ROS:   Past Medical History:  Diagnosis Date   Arthritis    DM type 2 (diabetes mellitus, type 2) (HCC)    GERD (gastroesophageal reflux disease)    History of kidney stones    Hyperlipemia    Hypertension    Left leg pain    Vision changes    Wears hearing aid in both ears      Past Surgical History:  Procedure Laterality Date   ABDOMINAL HYSTERECTOMY  1982   partial   APPENDECTOMY  1957   BACK SURGERY  07/12/2020   lower at [facility]   CYSTOSCOPY W/ URETERAL STENT PLACEMENT Right 10/06/2020   Procedure: CYSTOSCOPY WITH RETROGRADE PYELOGRAM/URETERAL STENT PLACEMENT;  Surgeon: Alvaro Hummer, MD;  Location: [facility] OR;  Service: Urology;  Laterality: Right;   CYSTOSCOPY WITH RETROGRADE PYELOGRAM, URETEROSCOPY AND STENT PLACEMENT Right 10/26/2020   Procedure: CYSTOSCOPY WITH RETROGRADE PYELOGRAM, URETEROSCOPY AND STENT REPLACEMENT;  Surgeon: Alvaro Hummer, MD;  Location[facility];  Service: Urology;  Laterality: Right;  90 MINS   HOLMIUM LASER APPLICATION Right 10/26/2020    Procedure: HOLMIUM LASER APPLICATION;  Surgeon: Alvaro Hummer, MD;  Location: [facility];  Service: Urology;  Laterality: Right;   JOINT REPLACEMENT  2016   both knees partial replacement    Family History  Problem Relation Age of Onset   Liver disease Neg Hx    Esophageal cancer Neg Hx    Colon cancer Neg Hx      Social Connections: Moderately Isolated (04/14/2023)   Social Connection and Isolation Panel    Frequency of Communication with Friends and Family: More than three times a week    Frequency of Social Gatherings with Friends and Family: More than three times a week    Attends Religious Services: Never    Database administrator or Organizations: No    Attends Engineer, structural: Never    Marital Status: Married      Current Outpatient Medications:    acetaminophen  (TYLENOL ) 325 MG tablet, , Disp: , Rfl:    benzonatate  (TESSALON ) 200 MG capsule, Take 1 capsule (200 mg total) by mouth 3 (three) times daily as needed., Disp: 30 capsule, Rfl: 0   bisoprolol -hydrochlorothiazide  (ZIAC ) 2.5-6.25 MG tablet, TAKE 1 TABLET BY MOUTH ONCE DAILY, Disp: 90 tablet, Rfl: 3   celecoxib  (CELEBREX ) 200 MG capsule, TAKE 1 CAPSULE BY MOUTH ONCE DAILY, Disp: 90 capsule, Rfl: 3   cycloSPORINE (RESTASIS) 0.05 % ophthalmic emulsion, , Disp: , Rfl:    FLAREX 0.1 % ophthalmic suspension, Apply to eye., Disp: , Rfl:    fluticasone  (FLONASE ) 50 MCG/ACT nasal spray, USE 2 SPRAYS IN EACH NOSTRIL EVERY DAY, Disp: 16 g, Rfl: 5   glipiZIDE  (GLUCOTROL  XL) 2.5 MG 24 hr tablet, TAKE 1 TABLET BY MOUTH EVERY DAY WITH BREAKFAST, Disp: 90 tablet, Rfl: 1   Glucosamine-Chondroit-Vit C-Mn (GLUCOSAMINE 1500 COMPLEX) CAPS, Take 1 capsule by mouth daily., Disp: , Rfl:    JANUVIA  50 MG tablet, TAKE 1 TABLET BY MOUTH EVERY DAY, Disp: 90 tablet, Rfl: 1   loratadine  (CLARITIN ) 10 MG tablet, Take 1 tablet (10 mg total) by mouth daily., Disp: 90 tablet, Rfl: 3   meclizine  (ANTIVERT ) 25 MG tablet,  Take 1 tablet (25 mg total) by mouth 3 (three) times daily as needed for dizziness., Disp: 30 tablet, Rfl: 0   Multiple Vitamins-Minerals (PRESERVISION AREDS PO), Take 1 tablet by mouth daily., Disp: , Rfl:    Polyvinyl Alcohol-Povidone PF (REFRESH) 1.4-0.6 % SOLN, , Disp: , Rfl:    Potassium 75 MG TABS, potassium, Disp: , Rfl:    pramipexole  (MIRAPEX ) 1 MG tablet, TAKE 2 TABLETS BY MOUTH AT BEDTIME, Disp: 180 tablet, Rfl: 1   predniSONE  (DELTASONE ) 20 MG tablet, Take 1 tablet (20 mg total) by mouth daily with breakfast., Disp: 5 tablet, Rfl: 0   sertraline  (ZOLOFT ) 50 MG tablet, TAKE 1 TABLET BY MOUTH EVERY DAY, Disp: 90 tablet, Rfl: 1   Tetrahydrozoline-Zn Sulfate (EYE DROPS A/C OP), Apply 1 drop to eye daily as needed (dry eyes)., Disp: , Rfl:    triamcinolone  cream (KENALOG ) 0.1 %, 1 application, Disp: , Rfl:    triamterene-hydrochlorothiazide  (MAXZIDE-25) 37.5-25 MG  tablet, TAKE 1 TABLET BY MOUTH EVERY DAY, Disp: 90 tablet, Rfl: 3   UNABLE TO FIND, Med Name: cbd oil to knees prn, Disp: , Rfl:    vitamin B-12 (CYANOCOBALAMIN) 100 MCG tablet, Vitamin B12, Disp: , Rfl:    Physical Exam:   BP (!) 161/59   Pulse 77   SpO2 94%   Pertinent Findings  CN II-XII intact Bilateral EAC clear and TM intact with sclerosis of the bilateral TMs, no appreciable effusion Anterior rhinoscopy: Septum midline; bilateral inferior turbinates with minimal hypertrophy No lesions of oral cavity/oropharynx; dentition within normal limits No obviously palpable neck masses/lymphadenopathy/thyromegaly No respiratory distress or stridor  Seprately Identifiable Procedures:  Procedure Note Pre-procedure diagnosis: Persistent left-sided middle ear effusion Post-procedure diagnosis: Same Procedure: Transnasal Fiberoptic Laryngoscopy, CPT 31575 - Mod 25 Indication: see above Complications: None apparent EBL: 0 mL   The procedure was undertaken to further evaluate the patient's complaint of persistent left-sided  middle ear effusion, with mirror exam inadequate for appropriate examination due to gag reflex and poor patient tolerance   Procedure:  Patient was identified as correct patient. Verbal consent was obtained. The nose was sprayed with oxymetazoline and 4% lidocaine . The The flexible laryngoscope was passed through the nose to view the nasal cavity, pharynx (oropharynx, hypopharynx)   Documentation was obtained and reviewed with patient. The scope was removed. The patient tolerated the procedure well.   Findings: The nasal cavity and nasopharynx did not reveal any masses or lesions, mucosa appeared to be without obvious lesions. No obstruction noted at eustachian tube meatus bilateral.  Larynx was not visualized due to patient's tolerance.          Impression & Plans:  Valary Manahan is a 83 y.o. female with the following   Decreased hearing-   83 year old female seen today for decreased hearing.  Her audiogram was significant for mixed hearing loss with air-bone gaps left greater than right with bilateral type B tympanometry.  Nasal endoscope did not show any lesions or obstruction of the left eustachian tube meatus.  CT shows left-sided middle ear effusion with no signs of lesions.  These symptoms have been going on for approximately 2 years, she has an audiogram documenting bilateral type B tympanometry at that time.  She has tried medications including Flonase  to see if this helps with the effusion, obviously at this point it has not.  Given the persistent left-sided effusion with decreased hearing I would like the patient to be seen by Dr. Tobie for consideration of left-sided tympanostomy tube.  She understands that there is no guarantee that this will improve her hearing but she would like to discuss this option if there is any chance that she may hear better out of the left ear.  The patient and her mother verbalized understanding and agreement to this plan, they will be placed on Dr. Tobie  schedule for routine evaluation.   - f/u follow-up with Dr. Tobie   Thank you for allowing me the opportunity to care for your patient. Please do not hesitate to contact me should you have any other questions.  Sincerely, Chyrl Cohen PA-C [facility] Phone: [phone] Fax: [phone]  04/20/2024, 1:42 PM

## 2024-05-04 ENCOUNTER — Encounter: Payer: Self-pay | Admitting: Adult Health

## 2024-05-04 ENCOUNTER — Telehealth: Payer: Self-pay

## 2024-05-04 ENCOUNTER — Ambulatory Visit (INDEPENDENT_AMBULATORY_CARE_PROVIDER_SITE_OTHER): Admitting: Adult Health

## 2024-05-04 VITALS — BP 132/88 | HR 97 | Temp 98.3°F | Ht 60.0 in | Wt 184.0 lb

## 2024-05-04 DIAGNOSIS — I10 Essential (primary) hypertension: Secondary | ICD-10-CM

## 2024-05-04 DIAGNOSIS — E119 Type 2 diabetes mellitus without complications: Secondary | ICD-10-CM

## 2024-05-04 DIAGNOSIS — R4189 Other symptoms and signs involving cognitive functions and awareness: Secondary | ICD-10-CM | POA: Diagnosis not present

## 2024-05-04 DIAGNOSIS — Z7984 Long term (current) use of oral hypoglycemic drugs: Secondary | ICD-10-CM | POA: Diagnosis not present

## 2024-05-04 LAB — POCT GLYCOSYLATED HEMOGLOBIN (HGB A1C): Hemoglobin A1C: 7.1 % — AB (ref 4.0–5.6)

## 2024-05-04 MED ORDER — SITAGLIPTIN PHOSPHATE 50 MG PO TABS: 50.0000 mg | ORAL_TABLET | Freq: Every day | ORAL | 1 refills | Status: DC

## 2024-05-04 MED ORDER — GLIPIZIDE ER 2.5 MG PO TB24: 2.5000 mg | ORAL_TABLET | Freq: Every day | ORAL | 1 refills | Status: DC

## 2024-05-04 MED ORDER — SERTRALINE HCL 50 MG PO TABS: 50.0000 mg | ORAL_TABLET | Freq: Every day | ORAL | 1 refills | Status: DC

## 2024-05-04 NOTE — Progress Notes (Signed)
 Subjective:    Patient ID: Ann Thompson, female    DOB: 1941/02/14, 83 y.o.   MRN: [id number]  HPI 83 year old female who  has a past medical history of Arthritis, DM type 2 (diabetes mellitus, type 2) (HCC), GERD (gastroesophageal reflux disease), History of kidney stones, Hyperlipemia, Hypertension, Left leg pain, Vision changes, and Wears hearing aid in both ears.  She presents to the office today for follow up regarding DM and HTN   Hypertension-currently prescribed Ziac  2.5-6.25 mg daily and Dyazide 37.5-25 mg daily.  She denies dizziness, lightheadedness, chest pain, or shortness of breath.  BP Readings from Last 3 Encounters:  05/04/24 132/88  04/20/24 (!) 161/59  03/14/24 (!) 155/67   Diabetes mellitus type 2-takes Januvia  50 mg daily and glipizide  2.5 mg daily.   She does not check her blood sugars at home.  Denies episodes of hypoglycemia. She has not been exercising on a routine basis.  Lab Results  Component Value Date   HGBA1C 6.5 (A) 08/18/2023   HGBA1C 6.8 (A) 05/22/2023   HGBA1C 7.7 (H) 12/03/2022   Additionally, there is some concern on the part of family members that she has been having some cognitive issues. They report forgetfulness. Is not getting lose while driving and can perform her ADLs. She is also hard of hearing and is having a tube placed in her left ear in two days. Her vision is also poor.   Review of Systems See HPI   Past Medical History:  Diagnosis Date   Arthritis    DM type 2 (diabetes mellitus, type 2) (HCC)    GERD (gastroesophageal reflux disease)    History of kidney stones    Hyperlipemia    Hypertension    Left leg pain    Vision changes    Wears hearing aid in both ears     Social History   Socioeconomic History   Marital status: Married    Spouse name: Not on file   Number of children: 2   Years of education: Not on file   Highest education level: GED or equivalent  Occupational History   Occupation: retired  Tobacco Use    Smoking status: Former    Current packs/day: 0.00    Average packs/day: 1 pack/day for 25.0 years (25.0 ttl pk-yrs)    Types: Cigarettes    Start date: 09/08/1956    Quit date: 09/08/1981    Years since quitting: 42.6   Smokeless tobacco: Never  Vaping Use   Vaping status: Never Used  Substance and Sexual Activity   Alcohol use: Yes    Alcohol/week: 4.0 standard drinks of alcohol    Types: 4 Glasses of wine per week   Drug use: Never   Sexual activity: Not on file  Other Topics Concern   Not on file  Social History Narrative   Not on file   Social Drivers of Health   Financial Resource Strain: Low Risk  (04/14/2023)   Overall Financial Resource Strain (CARDIA)    Difficulty of Paying Living Expenses: Not hard at all  Food Insecurity: No Food Insecurity (04/14/2023)   Hunger Vital Sign    Worried About Running Out of Food in the Last Year: Never true    Ran Out of Food in the Last Year: Never true  Transportation Needs: No Transportation Needs (04/14/2023)   PRAPARE - Administrator, Civil Service (Medical): No    Lack of Transportation (Non-Medical): No  Physical Activity: Sufficiently  Active (04/14/2023)   Exercise Vital Sign    Days of Exercise per Week: 3 days    Minutes of Exercise per Session: 60 min  Stress: No Stress Concern Present (04/14/2023)   [employer] of Occupational Health - Occupational Stress Questionnaire    Feeling of Stress : Not at all  Social Connections: Moderately Isolated (04/14/2023)   Social Connection and Isolation Panel    Frequency of Communication with Friends and Family: More than three times a week    Frequency of Social Gatherings with Friends and Family: More than three times a week    Attends Religious Services: Never    Database administrator or Organizations: No    Attends Banker Meetings: Never    Marital Status: Married  Catering manager Violence: Not At Risk (04/14/2023)   Humiliation, Afraid, Rape, and Kick  questionnaire    Fear of Current or Ex-Partner: No    Emotionally Abused: No    Physically Abused: No    Sexually Abused: No    Past Surgical History:  Procedure Laterality Date   ABDOMINAL HYSTERECTOMY  1982   partial   APPENDECTOMY  1957   BACK SURGERY  07/12/2020   lower at [facility]   CYSTOSCOPY W/ URETERAL STENT PLACEMENT Right 10/06/2020   Procedure: CYSTOSCOPY WITH RETROGRADE PYELOGRAM/URETERAL STENT PLACEMENT;  Surgeon: Alvaro Hummer, MD;  [facility] OR;  Service: Urology;  Laterality: Right;   CYSTOSCOPY WITH RETROGRADE PYELOGRAM, URETEROSCOPY AND STENT PLACEMENT Right 10/26/2020   Procedure: CYSTOSCOPY WITH RETROGRADE PYELOGRAM, URETEROSCOPY AND STENT REPLACEMENT;  Surgeon: Alvaro Hummer, MD;  Location: [facility];  Service: Urology;  Laterality: Right;  90 MINS   HOLMIUM LASER APPLICATION Right 10/26/2020   Procedure: HOLMIUM LASER APPLICATION;  Surgeon: Alvaro Hummer, MD;  Location: [facility];  Service: Urology;  Laterality: Right;   JOINT REPLACEMENT  2016   both knees partial replacement    Family History  Problem Relation Age of Onset   Liver disease Neg Hx    Esophageal cancer Neg Hx    Colon cancer Neg Hx     Allergies  Allergen Reactions   Diclofenac Swelling    1Suspected to have caused hives 2015-- had allergy testing  1Suspected to have caused hives 2015-- had allergy testing                                                    Tongue swelling  1Suspected to have caused hives 2015-- had allergy testing  1Suspected to have caused hives 2015-- had allergy testing                                                    Tongue swelling   Simvastatin      Mylagia    Metformin Other (See Comments)    Current Outpatient Medications on File Prior to Visit  Medication Sig Dispense Refill   acetaminophen  (TYLENOL ) 325 MG tablet      benzonatate  (TESSALON ) 200 MG capsule Take 1 capsule (200 mg total) by mouth 3  (three) times daily as needed. 30 capsule 0   bisoprolol -hydrochlorothiazide  (ZIAC ) 2.5-6.25 MG tablet TAKE 1 TABLET BY  MOUTH ONCE DAILY 90 tablet 3   celecoxib  (CELEBREX ) 200 MG capsule TAKE 1 CAPSULE BY MOUTH ONCE DAILY 90 capsule 3   cycloSPORINE (RESTASIS) 0.05 % ophthalmic emulsion      FLAREX 0.1 % ophthalmic suspension Apply to eye.     fluticasone  (FLONASE ) 50 MCG/ACT nasal spray USE 2 SPRAYS IN EACH NOSTRIL EVERY DAY 16 g 5   glipiZIDE  (GLUCOTROL  XL) 2.5 MG 24 hr tablet TAKE 1 TABLET BY MOUTH EVERY DAY WITH BREAKFAST 90 tablet 1   Glucosamine-Chondroit-Vit C-Mn (GLUCOSAMINE 1500 COMPLEX) CAPS Take 1 capsule by mouth daily.     JANUVIA  50 MG tablet TAKE 1 TABLET BY MOUTH EVERY DAY 90 tablet 1   loratadine  (CLARITIN ) 10 MG tablet Take 1 tablet (10 mg total) by mouth daily. 90 tablet 3   meclizine  (ANTIVERT ) 25 MG tablet Take 1 tablet (25 mg total) by mouth 3 (three) times daily as needed for dizziness. 30 tablet 0   Multiple Vitamins-Minerals (PRESERVISION AREDS PO) Take 1 tablet by mouth daily.     Polyvinyl Alcohol-Povidone PF (REFRESH) 1.4-0.6 % SOLN      Potassium 75 MG TABS potassium     pramipexole  (MIRAPEX ) 1 MG tablet TAKE 2 TABLETS BY MOUTH AT BEDTIME 180 tablet 1   sertraline  (ZOLOFT ) 50 MG tablet TAKE 1 TABLET BY MOUTH EVERY DAY 90 tablet 1   Tetrahydrozoline-Zn Sulfate (EYE DROPS A/C OP) Apply 1 drop to eye daily as needed (dry eyes).     triamcinolone  cream (KENALOG ) 0.1 % 1 application     triamterene-hydrochlorothiazide  (MAXZIDE-25) 37.5-25 MG tablet TAKE 1 TABLET BY MOUTH EVERY DAY 90 tablet 3   UNABLE TO FIND Med Name: cbd oil to knees prn     vitamin B-12 (CYANOCOBALAMIN) 100 MCG tablet Vitamin B12     No current facility-administered medications on file prior to visit.    BP 132/88   Pulse 97   Temp 98.3 F (36.8 C) (Oral)   Ht 5' (1.524 m)   Wt 184 lb (83.5 kg)   SpO2 (!) 71%   BMI 35.94 kg/m       Objective:   Physical Exam Vitals and nursing note  reviewed.  Constitutional:      Appearance: Normal appearance. She is obese.  Cardiovascular:     Rate and Rhythm: Normal rate and regular rhythm.     Pulses: Normal pulses.     Heart sounds: Normal heart sounds.  Pulmonary:     Effort: Pulmonary effort is normal.     Breath sounds: Normal breath sounds.  Skin:    General: Skin is warm and dry.  Neurological:     General: No focal deficit present.     Mental Status: She is alert and oriented to person, place, and time.  Psychiatric:        Mood and Affect: Mood normal.        Behavior: Behavior normal.        Thought Content: Thought content normal.        Judgment: Judgment normal.        Assessment & Plan:  1. Diabetes mellitus treated with oral medication (HCC) (Primary)  - POC HgB A1c- 7.1 - has increased but it was also noted that her medictions may have run out. Will refill and have her follow up in 90 days for her CPE  - glipiZIDE  (GLUCOTROL  XL) 2.5 MG 24 hr tablet; Take 1 tablet (2.5 mg total) by mouth daily with breakfast.  Dispense:  90 tablet; Refill: 1 - sitaGLIPtin  (JANUVIA ) 50 MG tablet; Take 1 tablet (50 mg total) by mouth daily.  Dispense: 90 tablet; Refill: 1  2. Primary hypertension - At goal   3. Cognitive impairment    05/04/2024   11:42 AM  MMSE - Mini Mental State Exam  Orientation to time 5  Orientation to Place 5  Registration 3  Attention/ Calculation 5  Recall 3  Language- name 2 objects 2  Language- repeat 1  Language- follow 3 step command 3  Language- read & follow direction 1  Write a sentence 1  Copy design 1  Total score 30  - No concern for cognitive impairment. I believe her poor vision and poor hearing contribute to what family is noticing.   Jannifer Fischler, NP

## 2024-05-04 NOTE — Patient Instructions (Signed)
 It was great seeing you today   Your A1c was 7.1 - please start exercising.   I did a cognitive evaluation on you today and you score was perfect. This is reassuring and tells me that we do not need to be concerned about Dementia.   Follow up in 3 months for your annual exam

## 2024-05-04 NOTE — Telephone Encounter (Signed)
 Copied from CRM [id number]. Topic: Clinical - Medical Advice >> Apr 11, 2024  9:13 AM Carlatta H wrote: Reason for CRM: Please call back Patients daughter Santana at [phone]//She did not want the patient called about this phone call >> May 04, 2024 11:42 AM Franky GRADE wrote: Patient's daughter Santana is calling to speak with Darleene Shape regarding patient's memory concerns. Patient is in the office for an appointment today.

## 2024-05-06 ENCOUNTER — Encounter (INDEPENDENT_AMBULATORY_CARE_PROVIDER_SITE_OTHER): Payer: Self-pay | Admitting: Otolaryngology

## 2024-05-06 ENCOUNTER — Ambulatory Visit (INDEPENDENT_AMBULATORY_CARE_PROVIDER_SITE_OTHER): Admitting: Otolaryngology

## 2024-05-06 VITALS — BP 158/76 | HR 80 | Ht 60.0 in

## 2024-05-06 DIAGNOSIS — H900 Conductive hearing loss, bilateral: Secondary | ICD-10-CM

## 2024-05-06 DIAGNOSIS — H6993 Unspecified Eustachian tube disorder, bilateral: Secondary | ICD-10-CM

## 2024-05-06 DIAGNOSIS — H6522 Chronic serous otitis media, left ear: Secondary | ICD-10-CM

## 2024-05-06 DIAGNOSIS — H90A32 Mixed conductive and sensorineural hearing loss, unilateral, left ear with restricted hearing on the contralateral side: Secondary | ICD-10-CM

## 2024-05-06 DIAGNOSIS — H6992 Unspecified Eustachian tube disorder, left ear: Secondary | ICD-10-CM

## 2024-05-06 HISTORY — PX: EAR TUBE REMOVAL: SHX1486

## 2024-05-06 MED ORDER — OFLOXACIN 0.3 % OT SOLN: 4.0000 [drp] | Freq: Two times a day (BID) | OTIC | 0 refills | Status: AC

## 2024-05-06 MED ORDER — OFLOXACIN 0.3 % OT SOLN
4.0000 [drp] | Freq: Two times a day (BID) | OTIC | 0 refills | Status: DC
Start: 2024-05-06 — End: 2024-05-06

## 2024-05-06 NOTE — Progress Notes (Signed)
 Dear Dr. Merna, Here is my assessment for our mutual patient, Ann Thompson. Thank you for allowing me the opportunity to care for your patient. Please do not hesitate to contact me should you have any other questions. Sincerely, Dr. Eldora Blanch  Otolaryngology Clinic Note Referring provider: Dr. Merna HPI:  Ann Thompson is a 83 y.o. female kindly referred by Dr. Nafziger for evaluation of left chronic serous otitis media and hearing loss  Initial visit (summer 2025): has seen [place] multiple times. Noted hearing loss, 5 year h/o hearing loss which is progressive, left worse than right. No h/o ear trouble, no fullness, tinnitus. Has been wearing HA with audio on 07/28/2022 with b/l B/B tymps and 2025 with mixed HL with small ABG AD>AS with B/B tymps. Chyrl ordered a CT which showed b/l mastoid and left ME effusion and referred here to me.  --------------------------------------------------------- 05/06/2024 Seen in follow up. Husband accompanies her. She reports that overall she does not have ear postauricular pressure or discomfort; denies significant fullness but hearing has declined. Denies significant sinonasal symptoms but has tried flonase  without improvement. No PO steroid trial.  Personal or FHx of bleeding dz or anesthesia difficulty: no  AP/AC: no  Tobacco: no  Independent Review of Additional Tests or Records:  Jeff's notes reviewed CT Temporal Bones 04/08/2024: independently interpreted: noted b/l left > right mastoid effusions with modestly sclerotic mastoids with left ME opacification; no obvious ossicular chain disruption; left tegmen mastoideum thin but no large dehiscences. Audio (11/2023): B/B tymps; WRT as below; b/l downsloping primarily SNHL HL with ~15-20dB ABG AS in higher frequencies   PMH/Meds/All/SocHx/FamHx/ROS:   Past Medical History:  Diagnosis Date   Arthritis    DM type 2 (diabetes mellitus, type 2) (HCC)    GERD (gastroesophageal reflux disease)     History of kidney stones    Hyperlipemia    Hypertension    Left leg pain    Vision changes    Wears hearing aid in both ears      Past Surgical History:  Procedure Laterality Date   ABDOMINAL HYSTERECTOMY  1982   partial   APPENDECTOMY  1957   BACK SURGERY  07/12/2020   lower at [facility]   CYSTOSCOPY W/ URETERAL STENT PLACEMENT Right 10/06/2020   Procedure: CYSTOSCOPY WITH RETROGRADE PYELOGRAM/URETERAL STENT PLACEMENT;  Surgeon: Alvaro Hummer, MD;  Location: [facility] OR;  Service: Urology;  Laterality: Right;   CYSTOSCOPY WITH RETROGRADE PYELOGRAM, URETEROSCOPY AND STENT PLACEMENT Right 10/26/2020   Procedure: CYSTOSCOPY WITH RETROGRADE PYELOGRAM, URETEROSCOPY AND STENT REPLACEMENT;  Surgeon: Alvaro Hummer, MD;  Location: [facility];  Service: Urology;  Laterality: Right;  90 MINS   HOLMIUM LASER APPLICATION Right 10/26/2020   Procedure: HOLMIUM LASER APPLICATION;  Surgeon: Alvaro Hummer, MD;  Location: [facility];  Service: Urology;  Laterality: Right;   JOINT REPLACEMENT  2016   both knees partial replacement    Family History  Problem Relation Age of Onset   Liver disease Neg Hx    Esophageal cancer Neg Hx    Colon cancer Neg Hx      Social Connections: Moderately Isolated (04/14/2023)   Social Connection and Isolation Panel    Frequency of Communication with Friends and Family: More than three times a week    Frequency of Social Gatherings with Friends and Family: More than three times a week    Attends Religious Services: Never    Database administrator or Organizations: No  Attends Banker Meetings: Never    Marital Status: Married      Current Outpatient Medications:    acetaminophen  (TYLENOL ) 325 MG tablet, , Disp: , Rfl:    bisoprolol -hydrochlorothiazide  (ZIAC ) 2.5-6.25 MG tablet, TAKE 1 TABLET BY MOUTH ONCE DAILY, Disp: 90 tablet, Rfl: 3   celecoxib  (CELEBREX ) 200 MG capsule, TAKE 1 CAPSULE BY MOUTH ONCE  DAILY, Disp: 90 capsule, Rfl: 3   cycloSPORINE (RESTASIS) 0.05 % ophthalmic emulsion, , Disp: , Rfl:    FLAREX 0.1 % ophthalmic suspension, Apply to eye., Disp: , Rfl:    fluticasone  (FLONASE ) 50 MCG/ACT nasal spray, USE 2 SPRAYS IN EACH NOSTRIL EVERY DAY, Disp: 16 g, Rfl: 5   glipiZIDE  (GLUCOTROL  XL) 2.5 MG 24 hr tablet, Take 1 tablet (2.5 mg total) by mouth daily with breakfast., Disp: 90 tablet, Rfl: 1   Glucosamine-Chondroit-Vit C-Mn (GLUCOSAMINE 1500 COMPLEX) CAPS, Take 1 capsule by mouth daily., Disp: , Rfl:    loratadine  (CLARITIN ) 10 MG tablet, Take 1 tablet (10 mg total) by mouth daily., Disp: 90 tablet, Rfl: 3   meclizine  (ANTIVERT ) 25 MG tablet, Take 1 tablet (25 mg total) by mouth 3 (three) times daily as needed for dizziness., Disp: 30 tablet, Rfl: 0   Multiple Vitamins-Minerals (PRESERVISION AREDS PO), Take 1 tablet by mouth daily., Disp: , Rfl:    Polyvinyl Alcohol-Povidone PF (REFRESH) 1.4-0.6 % SOLN, , Disp: , Rfl:    Potassium 75 MG TABS, potassium, Disp: , Rfl:    pramipexole  (MIRAPEX ) 1 MG tablet, TAKE 2 TABLETS BY MOUTH AT BEDTIME, Disp: 180 tablet, Rfl: 1   sertraline  (ZOLOFT ) 50 MG tablet, Take 1 tablet (50 mg total) by mouth daily., Disp: 90 tablet, Rfl: 1   sitaGLIPtin  (JANUVIA ) 50 MG tablet, Take 1 tablet (50 mg total) by mouth daily., Disp: 90 tablet, Rfl: 1   Tetrahydrozoline-Zn Sulfate (EYE DROPS A/C OP), Apply 1 drop to eye daily as needed (dry eyes)., Disp: , Rfl:    triamcinolone  cream (KENALOG ) 0.1 %, 1 application, Disp: , Rfl:    triamterene-hydrochlorothiazide  (MAXZIDE-25) 37.5-25 MG tablet, TAKE 1 TABLET BY MOUTH EVERY DAY, Disp: 90 tablet, Rfl: 3   UNABLE TO FIND, Med Name: cbd oil to knees prn, Disp: , Rfl:    vitamin B-12 (CYANOCOBALAMIN) 100 MCG tablet, Vitamin B12, Disp: , Rfl:    ofloxacin  (FLOXIN ) 0.3 % OTIC solution, Place 4 drops into the left ear 2 (two) times daily for 5 days., Disp: 5 mL, Rfl: 0   Physical Exam:   BP (!) 158/76 (BP Location: Left  Arm, Patient Position: Sitting, Cuff Size: Large)   Pulse 80   Ht 5' (1.524 m)   SpO2 (!) 89%   BMI 35.94 kg/m   Salient findings:  CN II-XII intact Given history and complaints, ear microscopy was indicated and performed for evaluation with findings as below in physical exam section and in procedures; Bilateral EAC clear and TM intact with serous effusion left; unable to appreciate effusion on right [address] 512: mid [place]in[place] 512: AC > BC b/l  Anterior rhinoscopy: Septum intact; bilateral inferior turbinates without significant hypertrophy No lesions of oral cavity/oropharynx No obviously palpable neck masses No respiratory distress or stridor  Seprately Identifiable Procedures:  Prior to initiating any procedures, risks/benefits/alternatives were explained to the patient and verbal consent obtained.  Procedure: Bilateral ear microscopy with left myringotomy (CPT [phone] - LT) Pre-procedure diagnosis:  Left chronic serous otitis media Left mixed hearing loss Left eustachian tube dysfunction Post-procedure diagnosis: same  Indication: Patient is a 83 y.o. female with pre-procedure diagnoses above. We discussed options: 1. Observation 2. Continued Nasal sprays and PO steroid burst 3. Myringotomy v/s Tympanostomy tube. Risks discussed, patient opted for myringotomy only, if improved can consider tymp tube placement  We had a long discussion about benefits and risks of Tympanostomy tube placement including pain, bleeding, infection, early or late extrusion, TM perforation, otorrhea, injury to middle or external ear structures, nature of tympanostomy tubes, CSF leak discovery, cholesteatoma, hearing loss, among others. Consent was obtained prior to proceeding.  Findings: Right Ear: see above Left ear: see above Successful myringotomy on left with serous effusion encountered  Complications: None apparent  Procedure details: Patient was placed semi recumbent on the exam chair. The left ear  was addressed first with binocular microscopy and any cerumen was cleaned from the ear canal. The tympanic membrane was visualized, with findings as above. A focal spot over the inferior TM was anesthetized with topical phenol. A myringotomy incision was tthen made radially. Serous effusion was encountered. The middle ear cleft was suctioned. TCiprodex drops were applied. A cotton ball was placed at the meatus. The right side was examined with findings above.  Patient tolerated the procedure well   Impression & Plans:  Ann Thompson is a 83 y.o. female with:  1. Left chronic serous otitis media   2. Mixed conductive and sensorineural hearing loss of left ear with restricted hearing of right ear   3. Dysfunction of both eustachian tubes    Noted left ME effusion and mixed hearing loss, getting worse. We discussed options and patient opted for left myringotomy only. If improves after that, can consider left tymp tube.   Ofloxacin  drops BID x5d Left dry ear precautions - f/u 6 weeks with audio  See below regarding exact medications prescribed this encounter including dosages and route: Meds ordered this encounter  Medications   DISCONTD: ofloxacin  (FLOXIN ) 0.3 % OTIC solution    Sig: Place 4 drops into both ears 2 (two) times daily for 5 days.    Dispense:  5 mL    Refill:  0   ofloxacin  (FLOXIN ) 0.3 % OTIC solution    Sig: Place 4 drops into the left ear 2 (two) times daily for 5 days.    Dispense:  5 mL    Refill:  0      Thank you for allowing me the opportunity to care for your patient. Please do not hesitate to contact me should you have any other questions.  Sincerely, Eldora Blanch, MD Otolaryngologist (ENT), [facility] Phone: [phone] Fax: [phone]  05/07/2024, 3:45 PM   MDM:  Level 4: 99214 Complexity/Problems addressed: mod - chronic problems, worsening Data complexity: mod - independent CT interpretation - Morbidity: mod  - Prescription  Drug prescribed or managed: y

## 2024-05-06 NOTE — Patient Instructions (Addendum)
 Put in ofloxacin  ear drops 4 drops twice daily in left ear for 5 days. Keep hearing aid out during this time. You can wear the hearing aid after. You will have some drainage from the ear for first few days. We will see you back in 3 weeks with a hearing check

## 2024-06-06 ENCOUNTER — Telehealth (INDEPENDENT_AMBULATORY_CARE_PROVIDER_SITE_OTHER): Payer: Self-pay | Admitting: Physician Assistant

## 2024-06-06 NOTE — Telephone Encounter (Signed)
 The patient's spouse called in checking on timing and reason for upcoming appointment.  He mentioned that the patient also goes to [facility] for hearing tests and hearing aids.  He says she has had a test done within the last year and wanted to see if that would work for the coming appointment.  I asked him to find out when the last hearing test was done, he will call back with that information.  At that point, we can send a message to Chyrl to make the decision on if he would still want a hearing test done with the next appointment for the patient or not.

## 2024-06-09 ENCOUNTER — Encounter: Payer: Self-pay | Admitting: Adult Health

## 2024-06-10 ENCOUNTER — Other Ambulatory Visit: Payer: Self-pay | Admitting: Adult Health

## 2024-06-10 DIAGNOSIS — G2581 Restless legs syndrome: Secondary | ICD-10-CM

## 2024-06-13 ENCOUNTER — Encounter (INDEPENDENT_AMBULATORY_CARE_PROVIDER_SITE_OTHER): Payer: Self-pay | Admitting: Physician Assistant

## 2024-06-13 ENCOUNTER — Ambulatory Visit (INDEPENDENT_AMBULATORY_CARE_PROVIDER_SITE_OTHER): Admitting: Physician Assistant

## 2024-06-13 VITALS — BP 153/64 | HR 76 | Ht 60.0 in | Wt 184.0 lb

## 2024-06-13 DIAGNOSIS — H906 Mixed conductive and sensorineural hearing loss, bilateral: Secondary | ICD-10-CM

## 2024-06-13 DIAGNOSIS — H6592 Unspecified nonsuppurative otitis media, left ear: Secondary | ICD-10-CM

## 2024-06-13 DIAGNOSIS — H9192 Unspecified hearing loss, left ear: Secondary | ICD-10-CM

## 2024-06-13 NOTE — Progress Notes (Signed)
 Dear Dr. Merna, Here is my assessment for our mutual patient, Ann Thompson. Thank you for allowing me the opportunity to care for your patient. Please do not hesitate to contact me should you have any other questions. Sincerely, Chyrl Cohen PA-C  Otolaryngology Clinic Note Referring provider: Dr. Merna HPI:  Ann Thompson is a 83 y.o. female kindly referred by Dr. Merna   The patient is an 83 year old female seen in office for follow-up evaluation status post left myringotomy by Dr. Tobie on 05/06/2024.  I had seen the patient in the office numerous times over the last several months.  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  She had been wearing hearing aids.  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  She had follow-up CT scan which showed bilateral mastoid effusion and left middle ear effusion.  Dr. Tobie performed myringotomy on 05/06/2024.  Patient feels like her hearing did improve after the myringotomy, she is uncertain if it has worsened again or not.  Her husband notes that her hearing generally fluctuates so this cause difficulty in her describing how her hearing is today.  She denies any issues or concerns, no draining, no fever.  Independent Review of Additional Tests or Records:  None   PMH/Meds/All/SocHx/FamHx/ROS:   Past Medical History:  Diagnosis Date   Arthritis    DM type 2 (diabetes mellitus, type 2) (HCC)    GERD (gastroesophageal reflux disease)    History of kidney stones    Hyperlipemia    Hypertension    Left leg pain    Vision changes    Wears hearing aid in both ears      Past Surgical History:  Procedure Laterality Date   ABDOMINAL HYSTERECTOMY  1982   partial   APPENDECTOMY  1957   BACK SURGERY  07/12/2020   lower at [facility]   CYSTOSCOPY W/  URETERAL STENT PLACEMENT Right 10/06/2020   Procedure: CYSTOSCOPY WITH RETROGRADE PYELOGRAM/URETERAL STENT PLACEMENT;  Surgeon: Alvaro Hummer, MD;  Location: [facility] OR;  Service: Urology;  Laterality: Right;   CYSTOSCOPY WITH RETROGRADE PYELOGRAM, URETEROSCOPY AND STENT PLACEMENT Right 10/26/2020   Procedure: CYSTOSCOPY WITH RETROGRADE PYELOGRAM, URETEROSCOPY AND STENT REPLACEMENT;  Surgeon: Alvaro Hummer, MD;  Location: [facility];  Service: Urology;  Laterality: Right;  90 MINS   EAR TUBE REMOVAL Left 05/06/2024   HOLMIUM LASER APPLICATION Right 10/26/2020   Procedure: HOLMIUM LASER APPLICATION;  Surgeon: Alvaro Hummer, MD;  Location: [facility];  Service: Urology;  Laterality: Right;   JOINT REPLACEMENT  2016   both knees partial replacement    Family History  Problem Relation Age of Onset   Liver disease Neg Hx    Esophageal cancer Neg Hx    Colon cancer Neg Hx      Social Connections: Moderately Isolated (04/14/2023)   Social Connection and Isolation Panel    Frequency of Communication with Friends and Family: More than three times a week    Frequency of Social Gatherings with Friends and Family: More than three times a week    Attends Religious Services: Never    Database administrator or Organizations: No    Attends Banker Meetings: Never    Marital Status: Married      Current Outpatient Medications:  acetaminophen  (TYLENOL ) 325 MG tablet, , Disp: , Rfl:    bisoprolol -hydrochlorothiazide  (ZIAC ) 2.5-6.25 MG tablet, TAKE 1 TABLET BY MOUTH ONCE DAILY, Disp: 90 tablet, Rfl: 3   celecoxib  (CELEBREX ) 200 MG capsule, TAKE 1 CAPSULE BY MOUTH ONCE DAILY, Disp: 90 capsule, Rfl: 3   cycloSPORINE (RESTASIS) 0.05 % ophthalmic emulsion, , Disp: , Rfl:    FLAREX 0.1 % ophthalmic suspension, Apply to eye., Disp: , Rfl:    fluticasone  (FLONASE ) 50 MCG/ACT nasal spray, USE 2 SPRAYS IN EACH NOSTRIL EVERY DAY, Disp: 16 g, Rfl: 5   glipiZIDE   (GLUCOTROL  XL) 2.5 MG 24 hr tablet, Take 1 tablet (2.5 mg total) by mouth daily with breakfast., Disp: 90 tablet, Rfl: 1   Glucosamine-Chondroit-Vit C-Mn (GLUCOSAMINE 1500 COMPLEX) CAPS, Take 1 capsule by mouth daily., Disp: , Rfl:    loratadine  (CLARITIN ) 10 MG tablet, Take 1 tablet (10 mg total) by mouth daily., Disp: 90 tablet, Rfl: 3   meclizine  (ANTIVERT ) 25 MG tablet, Take 1 tablet (25 mg total) by mouth 3 (three) times daily as needed for dizziness., Disp: 30 tablet, Rfl: 0   Multiple Vitamins-Minerals (PRESERVISION AREDS PO), Take 1 tablet by mouth daily., Disp: , Rfl:    Polyvinyl Alcohol-Povidone PF (REFRESH) 1.4-0.6 % SOLN, , Disp: , Rfl:    Potassium 75 MG TABS, potassium, Disp: , Rfl:    pramipexole  (MIRAPEX ) 1 MG tablet, TAKE 2 TABLETS BY MOUTH NIGHTLY AT BEDTIME, Disp: 180 tablet, Rfl: 1   sertraline  (ZOLOFT ) 50 MG tablet, Take 1 tablet (50 mg total) by mouth daily., Disp: 90 tablet, Rfl: 1   sitaGLIPtin  (JANUVIA ) 50 MG tablet, Take 1 tablet (50 mg total) by mouth daily., Disp: 90 tablet, Rfl: 1   Tetrahydrozoline-Zn Sulfate (EYE DROPS A/C OP), Apply 1 drop to eye daily as needed (dry eyes)., Disp: , Rfl:    triamcinolone  cream (KENALOG ) 0.1 %, 1 application, Disp: , Rfl:    triamterene-hydrochlorothiazide  (MAXZIDE-25) 37.5-25 MG tablet, TAKE 1 TABLET BY MOUTH EVERY DAY, Disp: 90 tablet, Rfl: 3   UNABLE TO FIND, Med Name: cbd oil to knees prn, Disp: , Rfl:    vitamin B-12 (CYANOCOBALAMIN) 100 MCG tablet, Vitamin B12, Disp: , Rfl:    Physical Exam:   BP (!) 153/64 (BP Location: Left Arm, Patient Position: Sitting, Cuff Size: Normal) Comment: PT STATED she always has high blood pressure. it bounches  Pulse 76   Ht 5' (1.524 m)   Wt 184 lb (83.5 kg)   SpO2 95%   BMI 35.94 kg/m   Pertinent Findings  CN II-XII intact Bilateral EACs are clear, left TM intact with sclerosis throughout, no obvious middle ear effusion, right TM intact with well-pneumatized middle ear space No  obvious neck masses/lymphadenopathy/thyromegaly No respiratory distress or stridor  Seprately Identifiable Procedures:  None  Impression & Plans:  Ann Thompson is a 83 y.o. female with the following   Follow-up status post left myringotomy-  Patient is doing well today.  She has difficulty with noting changes in her hearing, she does feel like her hearing did improve, she is uncertain if this is lasting or not.  Several attempts were made to have her describe her hearing in the left ear this was unsuccessful.  The patient was post have an audiogram at the time of office visit but had to reschedule her visit.  She has one scheduled on 06/22/2024.  Once this is complete I will call her with the results.  We will formulate a plan moving forward  at that time.   - f/u phone call discussion with audiological results   Thank you for allowing me the opportunity to care for your patient. Please do not hesitate to contact me should you have any other questions.  Sincerely, Chyrl Cohen PA-C [facility] Phone: [phone] Fax: [phone]  06/13/2024, 1:26 PM

## 2024-06-20 NOTE — Progress Notes (Signed)
  [address], [address] [place], [place] [address] [phone]  Audiological Evaluation    Name: Ann Thompson     DOB:   01-21-1941      MRN:   [id number]                                                                                     Service Date: 06/20/2024         Patient comes today after Reyes Cohen, PA-C sent a referral for a hearing evaluation due to concerns with  bilateral abnormal middle ear function.   Symptoms Yes Details  Hearing loss  [x]  Previous audiogram were at [facility].  Tinnitus  []    Ear pain/ infections/pressure  []    Balance problems  []    Noise exposure history  []    Previous ear surgeries  [x]  05-06-2024: left myringotomy   Family history of hearing loss  []    Amplification  [x]  Hearing aids from [facility]   Other  []      Otoscopy: Right ear: Clear external ear canal and notable landmarks visualized on the tympanic membrane. Left ear:  Clear external ear canal and notable landmarks visualized on the tympanic membrane.  Tympanometry: Right ear: Type B- Normal external ear canal volume with no middle ear pressure peak or tympanic membrane compliance. Left ear: Type B- Normal external ear canal volume with no middle ear pressure peak or tympanic membrane compliance.  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    Speech Audiometry: Right ear- Speech Reception Threshold (SRT) was obtained at 55 dBHL. Left ear-Speech Reception Threshold (SRT) was obtained at 50 dBHL.   Word Recognition Score Tested using NU-6 (recorded) Right ear: 84% was obtained at a presentation level of 90 dBHL with contralateral masking which is deemed as  good . Left ear: 88% was obtained at a presentation level of 90 dBHL with contralateral masking which is deemed as  good .   The hearing test results were completed under headphones and results are deemed to be of good reliability. Test technique:   conventional    Impression: There is not a significant difference in pure-tone thresholds between ears. There is not a significant difference in the word recognition score in between ears. Tympanogram results continue to suggest abnormal middle ear function.   Recommendations: Follow up with ENT as scheduled for today. Return for a hearing evaluation if concerns with hearing changes arise or per MD recommendation. Follow up with her audiologist or hearing aid dispenser.  Trea Latner MARIE LEROUX-MARTINEZ, AUD

## 2024-06-22 ENCOUNTER — Ambulatory Visit (INDEPENDENT_AMBULATORY_CARE_PROVIDER_SITE_OTHER): Admitting: Physician Assistant

## 2024-06-22 ENCOUNTER — Ambulatory Visit (INDEPENDENT_AMBULATORY_CARE_PROVIDER_SITE_OTHER): Admitting: Audiology

## 2024-06-22 DIAGNOSIS — H903 Sensorineural hearing loss, bilateral: Secondary | ICD-10-CM

## 2024-06-23 ENCOUNTER — Telehealth (INDEPENDENT_AMBULATORY_CARE_PROVIDER_SITE_OTHER): Payer: Self-pay

## 2024-06-23 NOTE — Telephone Encounter (Signed)
 Daughter called stated someone called yesterday and tried to give her mother the results of her audio and she could not hear. Daughter stated she would like a call back with the results. [phone]

## 2024-06-28 ENCOUNTER — Telehealth (INDEPENDENT_AMBULATORY_CARE_PROVIDER_SITE_OTHER): Payer: Self-pay | Admitting: Physician Assistant

## 2024-06-28 NOTE — Telephone Encounter (Signed)
 Attempted to call the patient's daughter to discuss audio results.  Left voicemail

## 2024-06-28 NOTE — Telephone Encounter (Signed)
 Thanks, I tried to call no answer. LMV

## 2024-07-05 ENCOUNTER — Encounter: Payer: Self-pay | Admitting: Audiology

## 2024-08-02 ENCOUNTER — Telehealth: Payer: Self-pay | Admitting: *Deleted

## 2024-08-02 NOTE — Telephone Encounter (Signed)
 Patient is aware.

## 2024-08-02 NOTE — Telephone Encounter (Signed)
 Copied from CRM [id number]. Topic: Appointments - Appointment Info/Confirmation >> Aug 02, 2024  9:29 AM Rosina BIRCH wrote: Patient/patient representative is calling for information regarding an appointment. >> Aug 02, 2024  9:34 AM Rosina BIRCH wrote: Patient would like to know if she need to fast for her physical appointment tomorrow [phone]

## 2024-08-03 ENCOUNTER — Ambulatory Visit: Admitting: Adult Health

## 2024-08-03 ENCOUNTER — Encounter: Payer: Self-pay | Admitting: Adult Health

## 2024-08-03 VITALS — BP 128/64 | HR 64 | Temp 98.3°F | Ht 59.0 in | Wt 183.0 lb

## 2024-08-03 DIAGNOSIS — I10 Essential (primary) hypertension: Secondary | ICD-10-CM | POA: Diagnosis not present

## 2024-08-03 DIAGNOSIS — G2581 Restless legs syndrome: Secondary | ICD-10-CM

## 2024-08-03 DIAGNOSIS — M5416 Radiculopathy, lumbar region: Secondary | ICD-10-CM | POA: Diagnosis not present

## 2024-08-03 DIAGNOSIS — E119 Type 2 diabetes mellitus without complications: Secondary | ICD-10-CM

## 2024-08-03 DIAGNOSIS — L299 Pruritus, unspecified: Secondary | ICD-10-CM

## 2024-08-03 DIAGNOSIS — M1990 Unspecified osteoarthritis, unspecified site: Secondary | ICD-10-CM

## 2024-08-03 DIAGNOSIS — M791 Myalgia, unspecified site: Secondary | ICD-10-CM

## 2024-08-03 DIAGNOSIS — Z7984 Long term (current) use of oral hypoglycemic drugs: Secondary | ICD-10-CM | POA: Diagnosis not present

## 2024-08-03 DIAGNOSIS — T466X5A Adverse effect of antihyperlipidemic and antiarteriosclerotic drugs, initial encounter: Secondary | ICD-10-CM

## 2024-08-03 LAB — LIPID PANEL
Cholesterol: 178 mg/dL (ref 0–200)
HDL: 49.2 mg/dL (ref >39.00)
LDL Cholesterol: 84 mg/dL (ref 0–99)
NonHDL: 128.73
Total CHOL/HDL Ratio: 4
Triglycerides: 223 mg/dL — ABNORMAL HIGH (ref 0.0–149.0)
VLDL: 44.6 mg/dL — ABNORMAL HIGH (ref 0.0–40.0)

## 2024-08-03 LAB — COMPREHENSIVE METABOLIC PANEL WITH GFR
ALT: 14 U/L (ref 0–35)
AST: 16 U/L (ref 0–37)
Albumin: 4.4 g/dL (ref 3.5–5.2)
Alkaline Phosphatase: 86 U/L (ref 39–117)
BUN: 28 mg/dL — ABNORMAL HIGH (ref 6–23)
CO2: 29 meq/L (ref 19–32)
Calcium: 10.9 mg/dL — ABNORMAL HIGH (ref 8.4–10.5)
Chloride: 99 meq/L (ref 96–112)
Creatinine, Ser: 0.91 mg/dL (ref 0.40–1.20)
GFR: 58.04 mL/min — ABNORMAL LOW (ref >60.00)
Glucose, Bld: 142 mg/dL — ABNORMAL HIGH (ref 70–99)
Potassium: 3.9 meq/L (ref 3.5–5.1)
Sodium: 136 meq/L (ref 135–145)
Total Bilirubin: 0.4 mg/dL (ref 0.2–1.2)
Total Protein: 7.8 g/dL (ref 6.0–8.3)

## 2024-08-03 LAB — CBC WITH DIFFERENTIAL/PLATELET
Basophils Absolute: 0 K/uL (ref 0.0–0.1)
Basophils Relative: 0.4 % (ref 0.0–3.0)
Eosinophils Absolute: 0.3 K/uL (ref 0.0–0.7)
Eosinophils Relative: 4.7 % (ref 0.0–5.0)
HCT: 40.4 % (ref 36.0–46.0)
Hemoglobin: 13.7 g/dL (ref 12.0–15.0)
Lymphocytes Relative: 24.6 % (ref 12.0–46.0)
Lymphs Abs: 1.8 K/uL (ref 0.7–4.0)
MCHC: 34 g/dL (ref 30.0–36.0)
MCV: 99 fl (ref 78.0–100.0)
Monocytes Absolute: 0.7 K/uL (ref 0.1–1.0)
Monocytes Relative: 9.2 % (ref 3.0–12.0)
Neutro Abs: 4.5 K/uL (ref 1.4–7.7)
Neutrophils Relative %: 61.1 % (ref 43.0–77.0)
Platelets: 218 K/uL (ref 150.0–400.0)
RBC: 4.08 Mil/uL (ref 3.87–5.11)
RDW: 13.3 % (ref 11.5–15.5)
WBC: 7.3 K/uL (ref 4.0–10.5)

## 2024-08-03 LAB — MICROALBUMIN / CREATININE URINE RATIO
Creatinine,U: 84.9 mg/dL
Microalb Creat Ratio: 63.1 mg/g — ABNORMAL HIGH (ref 0.0–30.0)
Microalb, Ur: 5.4 mg/dL — ABNORMAL HIGH (ref 0.0–1.9)

## 2024-08-03 LAB — TSH: TSH: 1.67 u[IU]/mL (ref 0.35–5.50)

## 2024-08-03 LAB — HEMOGLOBIN A1C: Hgb A1c MFr Bld: 7.1 % — ABNORMAL HIGH (ref 4.6–6.5)

## 2024-08-03 NOTE — Progress Notes (Signed)
 Subjective:    Patient ID: Ann Thompson, female    DOB: 1941/08/25, 83 y.o.   MRN: [id number]  HPI Patient presents for yearly preventative medicine examination. She is a pleasant 83 year old female who  has a past medical history of Arthritis, DM type 2 (diabetes mellitus, type 2) (HCC), GERD (gastroesophageal reflux disease), History of kidney stones, Hyperlipemia, Hypertension, Left leg pain, Vision changes, and Wears hearing aid in both ears.  Hypertension-currently prescribed Ziac  2.5-6.25 mg daily and  Maxzide 37.5-25 mg daily.  She denies dizziness, lightheadedness, chest pain, or shortness of breath.  BP Readings from Last 3 Encounters:  08/03/24 128/64  06/13/24 (!) 153/64  05/06/24 (!) 158/76   Diabetes mellitus type 2-takes Januvia  50 mg daily and glipizide  2.5 mg daily.   She does not check her blood sugars at home.  Denies episodes of hypoglycemia. She has not been exercising on a routine basis.  Lab Results  Component Value Date   HGBA1C 7.1 (A) 05/04/2024   HGBA1C 6.5 (A) 08/18/2023   HGBA1C 6.8 (A) 05/22/2023   Restless leg syndrome-takes Mirapex  1 mg nightly.  Feels as though this works well for her, is able to sleep through the night.  Chronic lumbar radiculopathy-is seen by physical medicine and rehab.  Has had multiple steroid injections and ablation but continues to have significant pain.  Chronic Pruritus- managed with Zoloft  25 mg daily. She continues to itch but not like before she started Zoloft .   Osteoarthritis -multiple joints - mostly back and knees, and hips. She feels somewhat controlled but continues to have daily pain. Currently taking Cymbalta   All immunizations and health maintenance protocols were reviewed with the patient and needed orders were placed.  Appropriate screening laboratory values were ordered for the patient including screening of hyperlipidemia, renal function and hepatic function.   Medication reconciliation,  past medical  history, social history, problem list and allergies were reviewed in detail with the patient  Goals were established with regard to weight loss, exercise, and  diet in compliance with medications. She leads a pretty sedentary life.  Wt Readings from Last 3 Encounters:  08/03/24 183 lb (83 kg)  06/13/24 184 lb (83.5 kg)  05/04/24 184 lb (83.5 kg)   She refuses bone density screening  Review of Systems  Constitutional: Negative.   HENT: Negative.    Eyes: Negative.   Respiratory: Negative.    Cardiovascular: Negative.   Gastrointestinal: Negative.   Endocrine: Negative.   Genitourinary: Negative.   Musculoskeletal:  Positive for arthralgias and back pain.  Skin: Negative.   Allergic/Immunologic: Negative.   Neurological: Negative.   Hematological: Negative.   Psychiatric/Behavioral: Negative.     Past Medical History:  Diagnosis Date   Arthritis    DM type 2 (diabetes mellitus, type 2) (HCC)    GERD (gastroesophageal reflux disease)    History of kidney stones    Hyperlipemia    Hypertension    Left leg pain    Vision changes    Wears hearing aid in both ears     Social History   Socioeconomic History   Marital status: Married    Spouse name: Not on file   Number of children: 2   Years of education: Not on file   Highest education level: GED or equivalent  Occupational History   Occupation: retired  Tobacco Use   Smoking status: Former    Current packs/day: 0.00    Average packs/day: 1 pack/day for 25.0 years (  25.0 ttl pk-yrs)    Types: Cigarettes    Start date: 09/08/1956    Quit date: 09/08/1981    Years since quitting: 42.9   Smokeless tobacco: Never  Vaping Use   Vaping status: Never Used  Substance and Sexual Activity   Alcohol use: Yes    Alcohol/week: 4.0 standard drinks of alcohol    Types: 4 Glasses of wine per week   Drug use: Never   Sexual activity: Not on file  Other Topics Concern   Not on file  Social History Narrative   Not on file    Social Drivers of Health   Financial Resource Strain: Low Risk  (04/14/2023)   Overall Financial Resource Strain (CARDIA)    Difficulty of Paying Living Expenses: Not hard at all  Food Insecurity: No Food Insecurity (04/14/2023)   Hunger Vital Sign    Worried About Running Out of Food in the Last Year: Never true    Ran Out of Food in the Last Year: Never true  Transportation Needs: No Transportation Needs (04/14/2023)   PRAPARE - Administrator, Civil Service (Medical): No    Lack of Transportation (Non-Medical): No  Physical Activity: Sufficiently Active (04/14/2023)   Exercise Vital Sign    Days of Exercise per Week: 3 days    Minutes of Exercise per Session: 60 min  Stress: No Stress Concern Present (04/14/2023)   [employer] of Occupational Health - Occupational Stress Questionnaire    Feeling of Stress : Not at all  Social Connections: Moderately Isolated (04/14/2023)   Social Connection and Isolation Panel    Frequency of Communication with Friends and Family: More than three times a week    Frequency of Social Gatherings with Friends and Family: More than three times a week    Attends Religious Services: Never    Database Administrator or Organizations: No    Attends Banker Meetings: Never    Marital Status: Married  Catering Manager Violence: Not At Risk (04/14/2023)   Humiliation, Afraid, Rape, and Kick questionnaire    Fear of Current or Ex-Partner: No    Emotionally Abused: No    Physically Abused: No    Sexually Abused: No    Past Surgical History:  Procedure Laterality Date   ABDOMINAL HYSTERECTOMY  1982   partial   APPENDECTOMY  1957   BACK SURGERY  07/12/2020   lower at [facility]   CYSTOSCOPY W/ URETERAL STENT PLACEMENT Right 10/06/2020   Procedure: CYSTOSCOPY WITH RETROGRADE PYELOGRAM/URETERAL STENT PLACEMENT;  Surgeon: Alvaro Hummer, MD;  Location: [facility] OR;  Service: Urology;  Laterality: Right;   CYSTOSCOPY WITH  RETROGRADE PYELOGRAM, URETEROSCOPY AND STENT PLACEMENT Right 10/26/2020   Procedure: CYSTOSCOPY WITH RETROGRADE PYELOGRAM, URETEROSCOPY AND STENT REPLACEMENT;  Surgeon: Alvaro Hummer, MD;  Location: [facility];  Service: Urology;  Laterality: Right;  90 MINS   EAR TUBE REMOVAL Left 05/06/2024   HOLMIUM LASER APPLICATION Right 10/26/2020   Procedure: HOLMIUM LASER APPLICATION;  Surgeon: Alvaro Hummer, MD;  Location: [facility];  Service: Urology;  Laterality: Right;   JOINT REPLACEMENT  2016   both knees partial replacement    Family History  Problem Relation Age of Onset   Liver disease Neg Hx    Esophageal cancer Neg Hx    Colon cancer Neg Hx     Allergies  Allergen Reactions   Diclofenac Swelling    1Suspected to have caused hives 2015-- had allergy  testing  1Suspected to have caused hives 2015-- had allergy testing                                                    Tongue swelling  1Suspected to have caused hives 2015-- had allergy testing  1Suspected to have caused hives 2015-- had allergy testing                                                    Tongue swelling   Simvastatin      Mylagia    Metformin Other (See Comments)    Current Outpatient Medications on File Prior to Visit  Medication Sig Dispense Refill   acetaminophen  (TYLENOL ) 325 MG tablet      bisoprolol -hydrochlorothiazide  (ZIAC ) 2.5-6.25 MG tablet TAKE 1 TABLET BY MOUTH ONCE DAILY 90 tablet 3   celecoxib  (CELEBREX ) 200 MG capsule TAKE 1 CAPSULE BY MOUTH ONCE DAILY 90 capsule 3   clobetasol (TEMOVATE) 0.05 % external solution daily as needed.     cycloSPORINE (RESTASIS) 0.05 % ophthalmic emulsion      FLAREX 0.1 % ophthalmic suspension Apply to eye.     fluorouracil (EFUDEX) 5 % cream 1 Application.     fluticasone  (FLONASE ) 50 MCG/ACT nasal spray USE 2 SPRAYS IN EACH NOSTRIL EVERY DAY 16 g 5   glipiZIDE  (GLUCOTROL  XL) 2.5 MG 24 hr tablet Take 1 tablet (2.5 mg total) by  mouth daily with breakfast. 90 tablet 1   Glucosamine-Chondroit-Vit C-Mn (GLUCOSAMINE 1500 COMPLEX) CAPS Take 1 capsule by mouth daily.     loratadine  (CLARITIN ) 10 MG tablet Take 1 tablet (10 mg total) by mouth daily. 90 tablet 3   meclizine  (ANTIVERT ) 25 MG tablet Take 1 tablet (25 mg total) by mouth 3 (three) times daily as needed for dizziness. 30 tablet 0   Multiple Vitamins-Minerals (PRESERVISION AREDS PO) Take 1 tablet by mouth daily.     Polyvinyl Alcohol-Povidone PF (REFRESH) 1.4-0.6 % SOLN      Potassium 75 MG TABS potassium     pramipexole  (MIRAPEX ) 1 MG tablet TAKE 2 TABLETS BY MOUTH NIGHTLY AT BEDTIME 180 tablet 1   sertraline  (ZOLOFT ) 50 MG tablet Take 1 tablet (50 mg total) by mouth daily. 90 tablet 1   sitaGLIPtin  (JANUVIA ) 50 MG tablet Take 1 tablet (50 mg total) by mouth daily. 90 tablet 1   Tetrahydrozoline-Zn Sulfate (EYE DROPS A/C OP) Apply 1 drop to eye daily as needed (dry eyes).     triamcinolone  cream (KENALOG ) 0.1 % 1 application     triamterene-hydrochlorothiazide  (MAXZIDE-25) 37.5-25 MG tablet TAKE 1 TABLET BY MOUTH EVERY DAY 90 tablet 3   UNABLE TO FIND Med Name: cbd oil to knees prn     vitamin B-12 (CYANOCOBALAMIN) 100 MCG tablet Vitamin B12     No current facility-administered medications on file prior to visit.    BP 128/64   Pulse 64   Temp 98.3 F (36.8 C) (Oral)   Ht 4' 11 (1.499 m)   Wt 183 lb (83 kg)   SpO2 95%   BMI 36.96 kg/m       Objective:   Physical Exam Vitals and nursing note reviewed.  Constitutional:  General: She is not in acute distress.    Appearance: Normal appearance. She is not ill-appearing.  HENT:     Head: Normocephalic and atraumatic.     Right Ear: Tympanic membrane, ear canal and external ear normal. There is no impacted cerumen.     Left Ear: Tympanic membrane, ear canal and external ear normal. There is no impacted cerumen.     Nose: Nose normal. No congestion or rhinorrhea.     Mouth/Throat:     Mouth:  Mucous membranes are moist.     Pharynx: Oropharynx is clear.  Eyes:     Extraocular Movements: Extraocular movements intact.     Conjunctiva/sclera: Conjunctivae normal.     Pupils: Pupils are equal, round, and reactive to light.  Neck:     Vascular: No carotid bruit.  Cardiovascular:     Rate and Rhythm: Normal rate and regular rhythm.     Pulses: Normal pulses.     Heart sounds: No murmur heard.    No friction rub. No gallop.  Pulmonary:     Effort: Pulmonary effort is normal.     Breath sounds: Normal breath sounds.  Abdominal:     General: Abdomen is flat. Bowel sounds are normal. There is no distension.     Palpations: Abdomen is soft. There is no mass.     Tenderness: There is no abdominal tenderness. There is no guarding or rebound.     Hernia: No hernia is present.  Musculoskeletal:        General: Normal range of motion.     Cervical back: Normal range of motion and neck supple.  Lymphadenopathy:     Cervical: No cervical adenopathy.  Skin:    General: Skin is warm and dry.     Capillary Refill: Capillary refill takes less than 2 seconds.  Neurological:     General: No focal deficit present.     Mental Status: She is alert and oriented to person, place, and time.  Psychiatric:        Mood and Affect: Mood normal.        Behavior: Behavior normal.        Thought Content: Thought content normal.        Judgment: Judgment normal.           Assessment & Plan:  1. Primary hypertension (Primary) - Controlled. No change in medication  - CBC with Differential/Platelet; Future - Comprehensive metabolic panel with GFR; Future - Lipid panel; Future - TSH; Future  2. Diabetes mellitus treated with oral medication (HCC) - Consider adding agent  - needs to start exercising  - CBC with Differential/Platelet; Future - Comprehensive metabolic panel with GFR; Future - Lipid panel; Future - TSH; Future - Hemoglobin A1c; Future - Microalbumin/Creatinine Ratio, Urine;  Future  3. Restless leg syndrome - Controlled. Continue with Mirapex   - CBC with Differential/Platelet; Future - Comprehensive metabolic panel with GFR; Future - Lipid panel; Future - TSH; Future  4. Lumbar radiculopathy - Follow up with Physical Medicine as directed - CBC with Differential/Platelet; Future - Comprehensive metabolic panel with GFR; Future - Lipid panel; Future - TSH; Future  5. Chronic pruritus - Controlled on Zoloft   - CBC with Differential/Platelet; Future - Comprehensive metabolic panel with GFR; Future - Lipid panel; Future - TSH; Future  6. Chronic osteoarthritis  - CBC with Differential/Platelet; Future - Comprehensive metabolic panel with GFR; Future - Lipid panel; Future - TSH; Future  7. Myalgia due to statin -  Continue to monitor; last lipid panel at goal. - Consider referral to lipid clinic  - CBC with Differential/Platelet; Future - Comprehensive metabolic panel with GFR; Future - Lipid panel; Future - TSH; Future  Darleene Shape, NP

## 2024-08-03 NOTE — Patient Instructions (Signed)
 It was great seeing you today   We will follow up with you regarding your lab work   Please let me know if you need anything   Please start exercising - it is going to be the best thing for you right now

## 2024-08-09 ENCOUNTER — Ambulatory Visit: Payer: Self-pay | Admitting: Adult Health

## 2024-09-14 ENCOUNTER — Other Ambulatory Visit: Payer: Self-pay | Admitting: Adult Health

## 2024-09-15 ENCOUNTER — Other Ambulatory Visit: Payer: Self-pay | Admitting: Adult Health

## 2024-09-15 DIAGNOSIS — E119 Type 2 diabetes mellitus without complications: Secondary | ICD-10-CM

## 2024-09-15 NOTE — Telephone Encounter (Signed)
 Patient need to schedule CPE for more refills.

## 2024-09-29 ENCOUNTER — Ambulatory Visit: Attending: Sports Medicine

## 2024-09-29 ENCOUNTER — Other Ambulatory Visit: Payer: Self-pay

## 2024-09-29 DIAGNOSIS — M25551 Pain in right hip: Secondary | ICD-10-CM | POA: Insufficient documentation

## 2024-09-29 DIAGNOSIS — R293 Abnormal posture: Secondary | ICD-10-CM | POA: Diagnosis not present

## 2024-09-29 DIAGNOSIS — R252 Cramp and spasm: Secondary | ICD-10-CM | POA: Diagnosis not present

## 2024-09-29 DIAGNOSIS — M79644 Pain in right finger(s): Secondary | ICD-10-CM | POA: Diagnosis not present

## 2024-09-29 DIAGNOSIS — R262 Difficulty in walking, not elsewhere classified: Secondary | ICD-10-CM | POA: Diagnosis not present

## 2024-09-29 DIAGNOSIS — M6281 Muscle weakness (generalized): Secondary | ICD-10-CM

## 2024-09-29 DIAGNOSIS — M7061 Trochanteric bursitis, right hip: Secondary | ICD-10-CM | POA: Insufficient documentation

## 2024-09-29 DIAGNOSIS — M25651 Stiffness of right hip, not elsewhere classified: Secondary | ICD-10-CM | POA: Diagnosis not present

## 2024-09-29 NOTE — Therapy (Signed)
 " OUTPATIENT PHYSICAL THERAPY LOWER EXTREMITY EVALUATION   Patient Name: Ann Thompson MRN: [id number] DOB:09-17-1940, 84 y.o., female Today's Date: 09/29/2024  END OF SESSION:  PT End of Session - 09/29/24 1239     Visit Number 1    Date for Recertification  11/24/24    Authorization Type Medicare    PT Start Time 1233    PT Stop Time 1315    PT Time Calculation (min) 42 min    Activity Tolerance Patient tolerated treatment well    Behavior During Therapy WFL for tasks assessed/performed          Past Medical History:  Diagnosis Date   Arthritis    DM type 2 (diabetes mellitus, type 2) (HCC)    GERD (gastroesophageal reflux disease)    History of kidney stones    Hyperlipemia    Hypertension    Left leg pain    Vision changes    Wears hearing aid in both ears    Past Surgical History:  Procedure Laterality Date   ABDOMINAL HYSTERECTOMY  1982   partial   APPENDECTOMY  1957   BACK SURGERY  07/12/2020   lower at [facility]   CYSTOSCOPY W/ URETERAL STENT PLACEMENT Right 10/06/2020   Procedure: CYSTOSCOPY WITH RETROGRADE PYELOGRAM/URETERAL STENT PLACEMENT;  Surgeon: Alvaro Hummer, MD;  Location: [facility] OR;  Service: Urology;  Laterality: Right;   CYSTOSCOPY WITH RETROGRADE PYELOGRAM, URETEROSCOPY AND STENT PLACEMENT Right 10/26/2020   Procedure: CYSTOSCOPY WITH RETROGRADE PYELOGRAM, URETEROSCOPY AND STENT REPLACEMENT;  Surgeon: Alvaro Hummer, MD;  Location: [facility];  Service: Urology;  Laterality: Right;  90 MINS   EAR TUBE REMOVAL Left 05/06/2024   HOLMIUM LASER APPLICATION Right 10/26/2020   Procedure: HOLMIUM LASER APPLICATION;  Surgeon: Alvaro Hummer, MD;  Location: [facility];  Service: Urology;  Laterality: Right;   JOINT REPLACEMENT  2016   both knees partial replacement   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   Pain of  left hip joint 12/25/2020   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   Ureteral stone 10/06/2020   Ureteral stone with hydronephrosis 10/06/2020   Lumbar radiculopathy 08/23/2020   Hypertension    Disc displacement, 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   Trochanteric bursitis of left hip 03/22/2020   Type 2 diabetes mellitus (HCC) 02/17/2020   Status post bilateral knee replacements 06/07/2015   S/P bilateral unicompartmental knee replacement 03/01/2015    PCP: Merna Huxley, NP   REFERRING PROVIDER: Curtis Debby PARAS, MD  REFERRING DIAG: M70.61 (ICD-10-CM) - Trochanteric bursitis, right hip  THERAPY DIAG:  Pain in right hip - Plan: PT plan of care cert/re-cert  Stiffness of right hip, not elsewhere classified - Plan: PT plan of care cert/re-cert  Difficulty in walking, not elsewhere classified - Plan: PT plan of care cert/re-cert  Muscle weakness (generalized) - Plan: PT plan of care cert/re-cert  Cramp and spasm - Plan: PT plan of care cert/re-cert  Abnormal posture - Plan: PT plan of care cert/re-cert  Pain of right thumb - Plan: PT plan of care cert/re-cert  Rationale for Evaluation and Treatment: Rehabilitation  ONSET DATE: 09/20/2024  SUBJECTIVE:   SUBJECTIVE STATEMENT: Patient is hard of hearing.  She states both legs and hips hurt.  She has hx of back surgery and bilateral TKA's done simultaneously.  She lives with her spouse in a town home that is handicap accessible.  She  confirms that she has plenty of grab bars and shower seat.  She no longer does her cleaning but does do some laundry.  She is married.  She and her spouse work together to [employer].  She has macular degeneration as well.  She admits she is sedentary and likes to knit but used to be very active and golfed several times per week.      PERTINENT HISTORY: Bilateral TKA's and lumbar surgery PAIN:  Are you having pain? Yes: NPRS scale: 5/10 low back hips (mainly on the left) Pain location: hips and  low back, some thigh pain (has pain patch on right thigh) Pain description: aching Aggravating factors: standing, walking Relieving factors: rest, meds  PRECAUTIONS: Fall  RED FLAGS: None   WEIGHT BEARING RESTRICTIONS: No  FALLS:  Has patient fallen in last 6 months? No  LIVING ENVIRONMENT: Lives with: lives with their spouse Lives in: House/apartment  OCCUPATION: retired  PLOF: Independent, Independent with basic ADLs, Independent with household mobility without device, Independent with community mobility without device, Independent with homemaking with ambulation, Independent with gait, and Independent with transfers  PATIENT GOALS: To gain control of the pain and be able to do routine daily tasks with increased ease  NEXT MD VISIT: prn  OBJECTIVE:  Note: Objective measures were completed at Evaluation unless otherwise noted.  DIAGNOSTIC FINDINGS:  2022 IMPRESSION: 1. 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 Moderate to severe bilateral neural foraminal narrowing at this level. 2. Moderate to severe right and mild left neural foraminal narrowing at L5-S1. 3. Moderate right and mild left neural foraminal narrowing at L3-4.  PATIENT SURVEYS:  LEFS  Extreme difficulty/unable (0), Quite a bit of difficulty (1), Moderate difficulty (2), Little difficulty (3), No difficulty (4) Survey date:  09/29/24  Score total:  Complete next visit     COGNITION: Overall cognitive status: Within functional limits for tasks assessed     SENSATION: WFL   MUSCLE LENGTH: Hamstrings: Right 45 deg; Left 45 deg Thomas test: Right pos; Left pos  POSTURE: rounded shoulders, forward head, and decreased lumbar lordosis  PALPATION: na  LOWER EXTREMITY ROM:  WFL  LOWER EXTREMITY MMT:  Generally 4 to 4+/5  LOWER EXTREMITY SPECIAL TESTS:  Hip  special tests: Belvie (FABER) test: negative, Thomas test: positive , and Ober's test: positive   FUNCTIONAL TESTS:  5 times sit to stand: 13.47 sec Timed up and go (TUG): 14.14 sec  GAIT: Distance walked: 30 feet Assistive device utilized: Single point cane Level of assistance: SBA Comments: unsteady on start up                                                                                                                                TREATMENT DATE: 09/29/24 Initial eval completed and initiated HEP  PATIENT EDUCATION:  Education details: Initiated HEP  Person educated: Patient Education method: Programmer, Multimedia, Facilities Manager, Verbal cues, and Handouts Education comprehension: verbalized understanding, returned demonstration, and verbal cues required  HOME EXERCISE PROGRAM: Access Code: [email or web address] URL: [email or web address] Date: 09/29/2024 Prepared by: Delon Haddock  Exercises - Seated Hamstring Stretch  - 1 x daily - 7 x weekly - 1 sets - 3 reps - 30 sec hold - Seated Hip Flexor Stretch  - 1 x daily - 7 x weekly - 1 sets - 3 reps - 30 sec hold - Sit to Stand with Armchair  - 1 x daily - 7 x weekly - 2 sets - 5 reps  ASSESSMENT:  CLINICAL IMPRESSION: Patient is a 84 y.o. female who was seen today for physical therapy evaluation and treatment for left hip pain and right thumb pain.  She presents with tight hamstrings, hip flexors, quads, and hip rotators.  She ambulates with short step length and unsteady gait using single point cane.  Strength on MMT's was actually fairly normal.  This is likely due to her hx of playing golf.  She would benefit from LE flexibility and strengthening, gait training, DME recommendations, postural strengthening and balance training.    OBJECTIVE IMPAIRMENTS: Abnormal gait, decreased mobility, difficulty walking, decreased ROM, decreased strength, increased fascial restrictions, increased muscle spasms, impaired flexibility,  postural dysfunction, obesity, and pain.   ACTIVITY LIMITATIONS: carrying, lifting, bending, standing, squatting, sleeping, stairs, transfers, bed mobility, bathing, toileting, dressing, and caring for others  PARTICIPATION LIMITATIONS: meal prep, cleaning, laundry, shopping, and community activity  PERSONAL FACTORS: Fitness, Past/current experiences, and 3+ comorbidities: diabetes, htn, hx of bilateral TKA's are also affecting patient's functional outcome.   REHAB POTENTIAL: Good  CLINICAL DECISION MAKING: Evolving/moderate complexity  EVALUATION COMPLEXITY: Moderate   GOALS: Goals reviewed with patient? Yes  SHORT TERM GOALS: Target date: 10/27/2024  Patient to report pain no greater than 2/10  Baseline: Goal status: INITIAL  2.  Patient will be independent with initial HEP  Baseline:  Goal status: INITIAL   LONG TERM GOALS: Target date: 11/24/2024  Patient to report pain no greater than 2/10  Baseline:  Goal status: INITIAL  2.  Patient to be independent with advanced HEP  Baseline:  Goal status: INITIAL  3.  Patient to be able to stand or walk for at least 15 min without leg pain  Baseline:  Goal status: INITIAL  4.  Patient to be able to ascend and descend steps without pain or no greater than 2/10  Baseline:  Goal status: INITIAL  5.  Patient to be able to sleep through the night  Baseline:  Goal status: INITIAL  6.  Patient to report 85% improvement in overall symptoms Baseline:  Goal status: INITIAL  7.  Functional tests to improve by 2-3 seconds  Baseline:  Goals status: INITIAL   PLAN:  PT FREQUENCY: 2x/week  PT DURATION: 8 weeks  PLANNED INTERVENTIONS: [id number]-Therapeutic exercises, 97530- Therapeutic activity, [id number]- Neuromuscular re-education, 97535- Self Care, [id number]- Manual therapy, [id number]- Gait training, [id number]- Canalith repositioning, [id number]- Aquatic Therapy, [phone]- Electrical stimulation (unattended), [phone]- Electrical stimulation (manual),  [id number]- Vasopneumatic device, [id number]- Ultrasound, [id number]- Traction (mechanical), [id number]- Ionotophoresis 4mg /ml Dexamethasone , [id number] (1-2 muscles), [id number] (3+ muscles)- Dry Needling, Patient/Family education, Balance training, Stair training, Taping, Joint mobilization, Spinal mobilization, Vestibular training, Visual/preceptual remediation/compensation, DME instructions, Cryotherapy, and Moist heat  PLAN FOR NEXT SESSION: Nustep, review HEP, begin hip stability training   Kasen Adduci B. Ahlia Lemanski, PT 09/29/24 4:14 PM [facility]  [address] [place], [place] [address] Phone # [phone] Fax [phone]  "
